# Patient Record
Sex: Male | Born: 1965 | Race: White | Hispanic: No | Marital: Married | Smoking: Never smoker
Health system: Southern US, Community
[De-identification: ages and names within clinical notes are randomized; demographics above are authoritative.]

## PROBLEM LIST (undated history)

## (undated) DIAGNOSIS — K219 Gastro-esophageal reflux disease without esophagitis: Secondary | ICD-10-CM

## (undated) HISTORY — DX: Gastro-esophageal reflux disease without esophagitis: K21.9

## (undated) HISTORY — PX: NO PAST SURGERIES: SHX2092

## (undated) HISTORY — PX: CLOSED REDUCTION ANKLE FRACTURE: SUR210

---

## 2003-08-31 ENCOUNTER — Emergency Department (HOSPITAL_COMMUNITY): Admission: EM | Admit: 2003-08-31 | Discharge: 2003-08-31 | Payer: Self-pay | Admitting: Family Medicine

## 2004-04-16 ENCOUNTER — Emergency Department (HOSPITAL_COMMUNITY): Admission: EM | Admit: 2004-04-16 | Discharge: 2004-04-16 | Payer: Self-pay | Admitting: Family Medicine

## 2005-09-12 ENCOUNTER — Emergency Department (HOSPITAL_COMMUNITY): Admission: EM | Admit: 2005-09-12 | Discharge: 2005-09-12 | Payer: Self-pay | Admitting: Family Medicine

## 2011-03-05 ENCOUNTER — Inpatient Hospital Stay (INDEPENDENT_AMBULATORY_CARE_PROVIDER_SITE_OTHER)
Admission: RE | Admit: 2011-03-05 | Discharge: 2011-03-05 | Disposition: A | Discharge status: Home / Self Care | Payer: 59 | Source: Ambulatory Visit | Attending: Emergency Medicine | Admitting: Emergency Medicine

## 2011-03-05 ENCOUNTER — Inpatient Hospital Stay (HOSPITAL_COMMUNITY)
Admission: EM | Admit: 2011-03-05 | Discharge: 2011-03-06 | DRG: 638 | Disposition: A | Discharge status: Home / Self Care | Payer: 59 | Source: Ambulatory Visit | Attending: Internal Medicine | Admitting: Internal Medicine

## 2011-03-05 ENCOUNTER — Emergency Department (HOSPITAL_COMMUNITY): Payer: 59

## 2011-03-05 DIAGNOSIS — E119 Type 2 diabetes mellitus without complications: Secondary | ICD-10-CM

## 2011-03-05 DIAGNOSIS — E871 Hypo-osmolality and hyponatremia: Secondary | ICD-10-CM | POA: Diagnosis present

## 2011-03-05 DIAGNOSIS — B379 Candidiasis, unspecified: Secondary | ICD-10-CM | POA: Diagnosis present

## 2011-03-05 DIAGNOSIS — E781 Pure hyperglyceridemia: Secondary | ICD-10-CM | POA: Diagnosis present

## 2011-03-05 DIAGNOSIS — E1101 Type 2 diabetes mellitus with hyperosmolarity with coma: Principal | ICD-10-CM | POA: Diagnosis present

## 2011-03-05 LAB — URINALYSIS, ROUTINE W REFLEX MICROSCOPIC
Hgb urine dipstick: NEGATIVE
Leukocytes, UA: NEGATIVE
Nitrite: NEGATIVE
Specific Gravity, Urine: 1.043 — ABNORMAL HIGH (ref 1.005–1.030)
Urobilinogen, UA: 0.2 mg/dL (ref 0.0–1.0)

## 2011-03-05 LAB — CBC
Hemoglobin: 15.3 g/dL (ref 13.0–17.0)
MCHC: 36.3 g/dL — ABNORMAL HIGH (ref 30.0–36.0)
Platelets: 241 10*3/uL (ref 150–400)
RBC: 5.25 MIL/uL (ref 4.22–5.81)

## 2011-03-05 LAB — POCT URINALYSIS DIP (DEVICE)
Bilirubin Urine: NEGATIVE
Glucose, UA: 500 mg/dL — AB
Leukocytes, UA: NEGATIVE
Nitrite: NEGATIVE

## 2011-03-05 LAB — POCT I-STAT, CHEM 8
Creatinine, Ser: 1 mg/dL (ref 0.50–1.35)
HCT: 50 % (ref 39.0–52.0)
Hemoglobin: 17 g/dL (ref 13.0–17.0)
Potassium: 4.5 mEq/L (ref 3.5–5.1)
Sodium: 128 mEq/L — ABNORMAL LOW (ref 135–145)

## 2011-03-05 LAB — DIFFERENTIAL
Basophils Absolute: 0.1 10*3/uL (ref 0.0–0.1)
Basophils Relative: 1 % (ref 0–1)
Neutro Abs: 8.3 10*3/uL — ABNORMAL HIGH (ref 1.7–7.7)
Neutrophils Relative %: 67 % (ref 43–77)

## 2011-03-05 LAB — COMPREHENSIVE METABOLIC PANEL
Albumin: 3.6 g/dL (ref 3.5–5.2)
Alkaline Phosphatase: 118 U/L — ABNORMAL HIGH (ref 39–117)
BUN: 18 mg/dL (ref 6–23)
Calcium: 9.5 mg/dL (ref 8.4–10.5)
Potassium: 4.1 mEq/L (ref 3.5–5.1)
Total Protein: 6.3 g/dL (ref 6.0–8.3)

## 2011-03-05 LAB — GLUCOSE, CAPILLARY
Glucose-Capillary: 407 mg/dL — ABNORMAL HIGH (ref 70–99)
Glucose-Capillary: 543 mg/dL — ABNORMAL HIGH (ref 70–99)
Glucose-Capillary: 338 mg/dL — ABNORMAL HIGH (ref 70–99)

## 2011-03-05 LAB — URINE MICROSCOPIC-ADD ON

## 2011-03-05 LAB — POCT I-STAT 3, VENOUS BLOOD GAS (G3P V)
pCO2, Ven: 46 mmHg (ref 45.0–50.0)
pH, Ven: 7.325 — ABNORMAL HIGH (ref 7.250–7.300)
pO2, Ven: 29 mmHg — CL (ref 30.0–45.0)

## 2011-03-05 NOTE — H&P (Signed)
Jim [name], Jim [name]               ACCOUNT NO.:  [id number]  MEDICAL RECORD NO.:  [id number]  LOCATION:  [facility]                         FACILITY:  [facility]  PHYSICIAN:  Conley Canal, MD      DATE OF BIRTH:  1966/05/25  DATE OF ADMISSION:  03/05/2011 DATE OF DISCHARGE:                             HISTORY & PHYSICAL   The patient has no primary care physician.  CHIEF COMPLAINT:  Excessive thirst, polyuria, polydipsia, blurred vision for the last 2-3 weeks.  HISTORY OF PRESENT ILLNESS:  Jim [name] is a 45 year old male with no significant past medical history who comes in today with complaints of excessive thirst, polyuria, polydipsia, blurred vision with no other associated symptoms.  No respiratory, gastrointestinal , or other symptomatology.  He denies any fever.  He had otherwise been healthy, but he has felt somewhat lethargic in the last few weeks.  He denies any dysuria.  Upon presentation to the emergency room, he was found to have blood sugar greater than 700 mg/dL and to be also hyponatremic with a sodium of 130, and a WBC 12.4.  ABG showed pH of 7.32.  The patient was started on IV fluids and also glucose stabilizer and was referred to the Hospitalist Service for further management.  He has not been following with primary care in the last few years as he has been otherwise healthy.  PAST MEDICAL HISTORY:  None.  SOCIAL HISTORY:  The patient is married.  He works for [employer], occasionally drinks alcohol and denies cigarette smoking or illicit drugs.  FAMILY HISTORY:  Positive for brother with premature coronary artery disease and nephew with diabetes mellitus.  ALLERGIES:  No known drug allergies.  HOME MEDICATIONS:  None.  REVIEW OF SYSTEMS:  Unremarkable except as highlighted in the history of present illness.  PHYSICAL EXAMINATION:  GENERAL:  On examination, this is a well looking middle-aged male who is not in acute distress. VITAL SIGNS:  Blood  pressure 130 systolic, heart rate 80s.  He is febrile, oxygenating adequately, respiratory rate around 18. HEAD, EARS, NOSE AND THROAT:  Pupils are equal reacting to light.  No jugular venous distention.  No carotid bruits. RESPIRATORY SYSTEM:  Good air entry bilaterally with no rhonchi, rales, or wheezes. CARDIOVASCULAR SYSTEM:  First and second heart sounds heard.  No murmurs.  Pulse regular. ABDOMEN:  Scaphoid, soft, and nontender.  No palpable organomegaly. Bowel sounds are normal. CNS:  The patient is alert and oriented in person, place, and time with no acute focal neurological deficits. EXTREMITIES:  No pedal edema.  Peripheral pulses equal.  LABORATORY FINDINGS:  Labs were reviewed significant for WBC of 12.4, hemoglobin 15.3, hematocrit 42.1, platelet count 241, eosinophils 8%, neutrophils 8.3.  Sodium 130, potassium 4.1, BUN 18, creatinine 0.83, bicarbonate 21, glucose 564, alkaline phosphatase 118, AST 34, ALT 54, calcium 9.5.  No imaging studies.  IMPRESSION:  A newly diagnosed diabetic who comes in with nonketotic hyperosmolar hyperglycemia.  He has leukocytosis which is probably secondary to demargination.  He has no evidence of infection.  The plan is: 1. Nonketotic hyperosmolar hyperglycemia, a newly diagnosed diabetic.     He seems to be responding to current  interventions in the emergency     room.  His sugars have improved to the 200 mg/dL range.  He has     received about 3 liters of IV fluids so far.  We will admit the     patient to regular medicine, and I will give him Lantus 15 units     subcu now with some aspartate insulin, discontinued the glucose     stabilizer and continued IV fluids.  Expect hyperglycemia to     continue to improve.  We will also obtain lipids panel, TSH, chest     x-ray.  Elevated liver enzymes could be related to ongoing     hyperglycemia.  We will repeat labs in the morning. 2. DVT, GI prophylaxis. 3. Anticipate early discharge.   The patient will need followup with     primary care for healthcare maintenance issues. 4. Patient's condition is fair.     Conley Canal, MD     SR/MEDQ  D:  03/05/2011  T:  03/05/2011  Job:  [id number]  Electronically Signed by Conley Canal  on 03/05/2011 10:28:50 PM

## 2011-03-06 LAB — COMPREHENSIVE METABOLIC PANEL
AST: 36 U/L (ref 0–37)
Albumin: 2.9 g/dL — ABNORMAL LOW (ref 3.5–5.2)
BUN: 19 mg/dL (ref 6–23)
Calcium: 8.2 mg/dL — ABNORMAL LOW (ref 8.4–10.5)
Creatinine, Ser: 0.84 mg/dL (ref 0.50–1.35)
GFR calc non Af Amer: >60 mL/min (ref >60)
Total Bilirubin: 0.2 mg/dL — ABNORMAL LOW (ref 0.3–1.2)

## 2011-03-06 LAB — LIPID PANEL
Cholesterol: 203 mg/dL — ABNORMAL HIGH (ref 0–200)
HDL: 24 mg/dL — ABNORMAL LOW (ref >39)
LDL Cholesterol: UNDETERMINED mg/dL (ref 0–99)
Triglycerides: 1352 mg/dL — ABNORMAL HIGH (ref <150)
VLDL: UNDETERMINED mg/dL (ref 0–40)

## 2011-03-06 LAB — CBC
HCT: 36.1 % — ABNORMAL LOW (ref 39.0–52.0)
Hemoglobin: 13.3 g/dL (ref 13.0–17.0)
MCH: 29.7 pg (ref 26.0–34.0)
MCHC: 36.8 g/dL — ABNORMAL HIGH (ref 30.0–36.0)
MCV: 80.6 fL (ref 78.0–100.0)
RBC: 4.48 MIL/uL (ref 4.22–5.81)

## 2011-03-06 LAB — GLUCOSE, CAPILLARY: Glucose-Capillary: 275 mg/dL — ABNORMAL HIGH (ref 70–99)

## 2011-03-06 LAB — PHOSPHORUS: Phosphorus: 2.8 mg/dL (ref 2.3–4.6)

## 2011-03-06 LAB — MAGNESIUM: Magnesium: 1.8 mg/dL (ref 1.5–2.5)

## 2011-03-07 LAB — URINE CULTURE
Culture  Setup Time: 201208200032
Culture: NO GROWTH

## 2011-03-12 LAB — CULTURE, BLOOD (ROUTINE X 2)
Culture: NO GROWTH
Culture: NO GROWTH

## 2011-03-14 ENCOUNTER — Ambulatory Visit (INDEPENDENT_AMBULATORY_CARE_PROVIDER_SITE_OTHER): Payer: 59 | Admitting: Internal Medicine

## 2011-03-14 ENCOUNTER — Encounter: Payer: Self-pay | Admitting: Internal Medicine

## 2011-03-14 VITALS — BP 106/70 | HR 84 | Temp 98.4°F | Resp 16 | Ht 69.0 in | Wt 157.0 lb

## 2011-03-14 DIAGNOSIS — E119 Type 2 diabetes mellitus without complications: Secondary | ICD-10-CM | POA: Insufficient documentation

## 2011-03-14 DIAGNOSIS — R51 Headache: Secondary | ICD-10-CM

## 2011-03-14 MED ORDER — INSULIN GLARGINE 100 UNIT/ML ~~LOC~~ SOLN: 18.0000 [IU] | Freq: Every day | SUBCUTANEOUS | Status: DC

## 2011-03-14 MED ORDER — METFORMIN HCL 500 MG PO TABS: 500.0000 mg | ORAL_TABLET | Freq: Two times a day (BID) | ORAL | Status: DC

## 2011-03-14 MED ORDER — GLUCOSE BLOOD VI STRP: ORAL_STRIP | Status: DC

## 2011-03-14 NOTE — Progress Notes (Signed)
Subjective:    Patient ID: Jim [name], male    DOB: 12/18/1965, 45 y.o.   MRN: [id number]  Diabetes He presents for his initial diabetic visit. He has type 2 diabetes mellitus. No MedicAlert identification noted. The initial diagnosis of diabetes was made 1 week ago. His disease course has been improving. There are no hypoglycemic associated symptoms. (No episodes of hypoglycemia) Associated symptoms include blurred vision, fatigue, polydipsia, polyphagia, polyuria, visual change and weight loss. Pertinent negatives for diabetes include no chest pain, no foot paresthesias, no foot ulcerations and no weakness. (Pt was admitted to [facility] last week with fatigue, weight loss, polyphagia, polyuria. Found to have BG>700 per his report. Was started on Lantus and novolog.) Symptoms are improving. There are no diabetic complications. Risk factors for coronary artery disease include diabetes mellitus, dyslipidemia and male sex. Current diabetic treatment includes insulin injections. He is compliant with treatment all of the time. He is currently taking insulin at bedtime. Insulin injections are given by patient. Weight trend: pt reports still about 10lb below baseline prior to diagnosis. He is following a generally healthy (was given limited carbohydrate and nutrition counseling while inpatient) diet. Meal planning includes avoidance of concentrated sweets and carbohydrate counting. He has not had a previous visit with a dietician. His home blood glucose trend is decreasing steadily. His overall blood glucose range is >200 mg/dl. An ACE inhibitor/angiotensin II receptor blocker is not being taken. He does not see a podiatrist.Eye exam is not current.   Only other complaint today is mild headache, since recent hospitalization. HA is described as diffuse, occuring daily, alleviated by Tylenol 500mg .  Blurred vision as noted above. Of note, pt stopped all caffeine intake with recent hospitalization.  Outpatient  Encounter Prescriptions as of 03/14/2011  Medication Sig Dispense Refill  . Alum & Mag Hydroxide-Simeth (MAGIC MOUTHWASH) SOLN Take by mouth 4 (four) times a week.        Marland Kitchen Bioflavonoid Products (GRAPE SEED PO) Take 1 tablet by mouth daily.        . fluconazole (DIFLUCAN) 100 MG tablet Take 100 mg by mouth daily.        . insulin aspart (NOVOLOG) 100 UNIT/ML injection Inject 3 Units into the skin 3 (three) times daily before meals.        . insulin glargine (LANTUS) 100 UNIT/ML injection Inject 18 Units into the skin at bedtime.  10 mL  11  . DISCONTD: insulin glargine (LANTUS) 100 UNIT/ML injection Inject 18 Units into the skin at bedtime.        Marland Kitchen DISCONTD: insulin glargine (LANTUS) 100 UNIT/ML injection Inject 18 Units into the skin at bedtime.  10 mL  11  . glucose blood (ACCU-CHEK AVIVA) test strip Use as instructed  100 each  12     Review of Systems  Constitutional: Positive for weight loss, activity change, appetite change, fatigue and unexpected weight change. Negative for fever and chills.  Eyes: Positive for blurred vision and visual disturbance. Negative for pain.  Respiratory: Negative for chest tightness and shortness of breath.   Cardiovascular: Negative for chest pain, palpitations and leg swelling.  Gastrointestinal: Negative for nausea, abdominal pain, diarrhea, constipation and abdominal distention.  Genitourinary: Positive for polyuria.  Skin: Negative for wound.  Neurological: Negative for weakness and numbness.  Hematological: Positive for polydipsia and polyphagia.   BP 106/70  Pulse 84  Temp(Src) 98.4 F (36.9 C) (Oral)  Resp 16  Ht 5\' 9"  (1.753 m)  Wt 157  lb (71.215 kg)  BMI 23.18 kg/m2  SpO2 98%     Objective:   Physical Exam  Constitutional: He is oriented to person, place, and time. He appears well-developed and well-nourished. No distress.  HENT:  Head: Normocephalic and atraumatic.  Right Ear: External ear normal.  Left Ear: External ear normal.    Nose: Nose normal.  Mouth/Throat: Oropharynx is clear and moist. No oropharyngeal exudate.  Eyes: Conjunctivae and EOM are normal. Pupils are equal, round, and reactive to light. Right eye exhibits no discharge. Left eye exhibits no discharge. No scleral icterus.  Neck: Normal range of motion. Neck supple. No tracheal deviation present. No thyromegaly present.  Cardiovascular: Normal rate, regular rhythm, normal heart sounds and intact distal pulses.  Exam reveals no gallop and no friction rub.   No murmur heard. Pulmonary/Chest: Effort normal and breath sounds normal. No respiratory distress. He has no wheezes. He has no rales. He exhibits no tenderness.  Abdominal: Soft. Bowel sounds are normal. He exhibits no distension and no mass. There is no tenderness. There is no rebound and no guarding.  Musculoskeletal: Normal range of motion. He exhibits no edema and no tenderness.  Lymphadenopathy:    He has no cervical adenopathy.  Neurological: He is alert and oriented to person, place, and time. No cranial nerve deficit. Coordination normal.  Skin: Skin is warm and dry. No rash noted. He is not diaphoretic. No erythema. No pallor.  Psychiatric: He has a normal mood and affect. His behavior is normal. Judgment and thought content normal.          Assessment & Plan:   Headache - Mild. Alleviated by tylenol. Exam is normal. Suspect related to withdrawal from caffeine after recent hospitalization. Will continue to monitor for now and use prn tylenol and ibuprofen. If symptoms worsen, or persist, will consider further evaluation.

## 2011-03-14 NOTE — Assessment & Plan Note (Addendum)
  Assessment:    Diabetes Mellitus type II, under fair control. Markedly improved since hospital admission last week with BG>700.     Plan:    1.  Rx changes: will start Metformin 500mg  by mouth twice daily. We discussed increasing Lantus to 20 units, with goal of fasting BG 80-120, but will hold off to monitor change in BG with addition of metformin. He is on very low dose of Lantus, and may be able to transition to oral meds. He will email with blood sugars. Will request records from hospitalization. 2.  Education: Reviewed pathophysiology of diabetes and general goals of diabetes management including:  A1C (<7), blood pressure (<130/80), and cholesterol (LDL <70). We discussed starting ACEi and statin, but will hold off for now, as adding metformin today. He may not tolerate ACEi, as SBP near 100. 3.  Compliance at present is estimated to be excellent. Encouraged continued efforts at limiting carbohydrates. Pt is planning to participate in education program at [facility]. 4. Follow up: 2 weeks

## 2011-03-14 NOTE — Patient Instructions (Addendum)
Start Metformin 500mg . Continue Lantus and Novolog. Labs later this week or next week. Return visit 2 weeks. Goal fasting sugar 80-120.  Diabetes, Frequently Asked Questions WHAT IS DIABETES? Most of the food we eat is turned into glucose (sugar). Our bodies use it for energy. The pancreas makes a hormone called insulin. It helps glucose get into the cells of our bodies. When you have diabetes, your body either does not make enough insulin or cannot use its own insulin as well as it should. This causes sugars to build up in your blood. WHAT ARE THE SYMPTOMS OF DIABETES?  Frequent urination.   Excessive thirst.   Unexplained weight loss.   Extreme hunger.   Blurred vision.   Tingling or numbness in hands or feet.   Feeling very tired much of the time.   Dry, itchy skin.   Sores that are slow to heal.   Yeast infections.   WHAT ARE THE TYPES OF DIABETES? Type 1 Diabetes   About 10% of affected people have this type.   Usually occurs before the age of 18.   Usually occurs in thin to normal weight people.  Type 2 Diabetes  About 90% of affected people have this type.   Usually occurs after the age of 65.   Usually occurs in overweight people.   More likely to have:   A family history of diabetes.   A history of diabetes during pregnancy (gestational diabetes).   High blood pressure.   High cholesterol and triglycerides.  Gestational Diabetes  Occurs in about 4% of pregnancies.   Usually goes away after the baby is born.   More likely to occur in women with:   Family history of diabetes.   Previous gestational diabetes.   Obese.   Over 25 years old.  WHAT IS PRE-DIABETES? Pre-diabetes means your blood glucose is higher than normal, but lower than the diabetes range. It also means you are at risk of getting type 2 diabetes and heart disease. If you are told you have pre-diabetes, have your blood glucose checked again in 1 to 2 years. WHAT IS THE TREATMENT  FOR DIABETES? Treatment is aimed at keeping blood glucose near normal levels at all times. Learning how to manage this yourself is important in treating diabetes. Depending on the type of diabetes you have, your treatment will include one or more of the following:  Monitoring your blood glucose.   Meal planning.   Exercise.   Oral medicine (pills) or insulin.  CAN DIABETES BE PREVENTED? With type 1 diabetes, prevention is more difficult, because the triggers that cause it are not yet known. With type 2 diabetes, prevention is more likely, with lifestyle changes:  Maintain a healthy weight.   Eat healthy.   Exercise.  IS THERE A CURE FOR DIABETES? No, there is no cure for diabetes. There is a lot of research going on that is looking for a cure, and progress is being made. Diabetes can be treated and controlled. People with diabetes can manage their diabetes and lead normal, active lives. SHOULD I BE TESTED FOR DIABETES? If you are at least 43 years old, you should be tested for diabetes. You should be tested again every 3 years. If you are 45 or older and overweight, you may want to get tested more often. If you are younger than 45, overweight, and have one or more of the following risk factors, you should be tested:  Family history of diabetes.   Inactive lifestyle.  High blood pressure.  WHAT ARE SOME OTHER SOURCES FOR INFORMATION ON DIABETES? The following organizations may help in your search for more information on diabetes: National Diabetes Education Program (NDEP) Internet: [employer] Telephone toll free: 289-696-4980 American Diabetes Association Telephone: (515)014-6425 To order publications toll free: 1-800-ADA-ORDER  Diabetes information: 1-814 872 0617 (800-DIABETES)  Internet: http://www.diabetes.org  Juvenile [employer] Telephone: (304)625-8105 Internet: [employer] Document Released: 07/06/2003 Document  Re-Released: 06/21/2009 [facility] Patient Information 2011 [place], [place].

## 2011-03-15 ENCOUNTER — Other Ambulatory Visit: Payer: Self-pay | Admitting: *Deleted

## 2011-03-15 MED ORDER — "INSULIN SYRINGE-NEEDLE U-100 30G X 3/8"" 0.3 ML MISC": Status: DC

## 2011-03-15 NOTE — Telephone Encounter (Signed)
Patient needs rx for insulin syringes to be sent to pharmacy.

## 2011-03-15 NOTE — Telephone Encounter (Signed)
Fine to fill. 

## 2011-03-17 ENCOUNTER — Other Ambulatory Visit (INDEPENDENT_AMBULATORY_CARE_PROVIDER_SITE_OTHER): Payer: 59 | Admitting: *Deleted

## 2011-03-17 ENCOUNTER — Encounter: Payer: Self-pay | Admitting: Internal Medicine

## 2011-03-17 DIAGNOSIS — E119 Type 2 diabetes mellitus without complications: Secondary | ICD-10-CM

## 2011-03-17 LAB — COMPREHENSIVE METABOLIC PANEL
ALT: 58 U/L — ABNORMAL HIGH (ref 0–53)
Albumin: 4.2 g/dL (ref 3.5–5.2)
Alkaline Phosphatase: 85 U/L (ref 39–117)
Glucose, Bld: 178 mg/dL — ABNORMAL HIGH (ref 70–99)
Potassium: 4.4 mEq/L (ref 3.5–5.1)
Sodium: 137 mEq/L (ref 135–145)
Total Protein: 6.8 g/dL (ref 6.0–8.3)

## 2011-03-17 LAB — MICROALBUMIN / CREATININE URINE RATIO: Microalb, Ur: 0.5 mg/dL (ref 0.0–1.9)

## 2011-03-17 LAB — LIPID PANEL
HDL: 40.4 mg/dL (ref >39.00)
Total CHOL/HDL Ratio: 5

## 2011-03-31 ENCOUNTER — Ambulatory Visit: Payer: 59 | Admitting: Internal Medicine

## 2011-04-12 ENCOUNTER — Ambulatory Visit (INDEPENDENT_AMBULATORY_CARE_PROVIDER_SITE_OTHER): Payer: 59 | Admitting: Internal Medicine

## 2011-04-12 ENCOUNTER — Encounter: Payer: Self-pay | Admitting: Internal Medicine

## 2011-04-12 DIAGNOSIS — E119 Type 2 diabetes mellitus without complications: Secondary | ICD-10-CM

## 2011-04-12 LAB — GLUCOSE, POCT (MANUAL RESULT ENTRY): POC Glucose: 137

## 2011-04-12 MED ORDER — GLIPIZIDE 5 MG PO TABS: 5.0000 mg | ORAL_TABLET | Freq: Two times a day (BID) | ORAL | Status: DC

## 2011-04-12 MED ORDER — "INSULIN SYRINGE-NEEDLE U-100 30G X 3/8"" 0.3 ML MISC": Status: DC

## 2011-04-12 MED ORDER — INSULIN GLARGINE 100 UNIT/ML ~~LOC~~ SOLN: 15.0000 [IU] | Freq: Every day | SUBCUTANEOUS | Status: DC

## 2011-04-12 NOTE — Patient Instructions (Signed)
Start glipizide 5mg  twice daily. Monitor blood sugar carefully. Call if blood sugar less than 80. If blood sugar is less than 80, stop Lantus.

## 2011-04-12 NOTE — Progress Notes (Signed)
Subjective:    Patient ID: Jim [name], male    DOB: 20-Aug-1965, 45 y.o.   MRN: [id number]  HPI Jim [name] is a 45 year old male with a history of diabetes who presents for followup. In the last month, he reports significant improvement in control of his blood sugar. He brings a record of his blood sugars today which show most fasting sugars between 70 and 100 and postprandial sugars between 100-150. There is one outlier high sugar of 207. He did have a low blood sugar this morning is 64 for which he was symptomatic. He continues to use Lantus 18 units at bedtime, however he has stopped using NovoLog because of low blood sugars. He reports that he has been feeling well and has made significant improvement in his diet. He denies any complaints today. He does not wish to receive a flu vaccine.  Outpatient Prescriptions Prior to Visit  Medication Sig Dispense Refill  . Bioflavonoid Products (GRAPE SEED PO) Take 1 tablet by mouth daily.        Marland Kitchen glucose blood (ACCU-CHEK AVIVA) test strip Use as instructed  100 each  12  . metFORMIN (GLUCOPHAGE) 500 MG tablet Take 1 tablet (500 mg total) by mouth 2 (two) times daily with a meal.  60 tablet  11  . fluconazole (DIFLUCAN) 100 MG tablet Take 100 mg by mouth daily.        . insulin aspart (NOVOLOG) 100 UNIT/ML injection Inject 3 Units into the skin as needed. Using as needed      . insulin glargine (LANTUS) 100 UNIT/ML injection Inject 18 Units into the skin at bedtime.  10 mL  11  . Insulin Syringe-Needle U-100 30G X 3/8" 0.3 ML MISC Inject 18 units daily at bedtime of lantus and inject 3 units 3 times daily before meals of novolog  120 each  11    Review of Systems  Constitutional: Negative for fever, chills, activity change, appetite change, fatigue and unexpected weight change.  Eyes: Negative for visual disturbance.  Respiratory: Negative for cough and shortness of breath.   Cardiovascular: Negative for chest pain, palpitations and leg  swelling.  Gastrointestinal: Negative for abdominal pain and abdominal distention.  Genitourinary: Negative for dysuria, urgency and difficulty urinating.  Musculoskeletal: Negative for arthralgias and gait problem.  Skin: Negative for color change and rash.  Hematological: Negative for adenopathy.  Psychiatric/Behavioral: Negative for sleep disturbance and dysphoric mood. The patient is not nervous/anxious.    BP 103/66  Pulse 92  Temp(Src) 98.2 F (36.8 C) (Oral)  Resp 16  Ht 5\' 9"  (1.753 m)  Wt 164 lb (74.39 kg)  BMI 24.22 kg/m2  SpO2 99%     Objective:   Physical Exam  Constitutional: He is oriented to person, place, and time. He appears well-developed and well-nourished. No distress.  HENT:  Head: Normocephalic and atraumatic.  Right Ear: External ear normal.  Left Ear: External ear normal.  Nose: Nose normal.  Mouth/Throat: Oropharynx is clear and moist. No oropharyngeal exudate.  Eyes: Conjunctivae and EOM are normal. Pupils are equal, round, and reactive to light. Right eye exhibits no discharge. Left eye exhibits no discharge. No scleral icterus.  Neck: Normal range of motion. Neck supple. No tracheal deviation present. No thyromegaly present.  Cardiovascular: Normal rate, regular rhythm and normal heart sounds.  Exam reveals no gallop and no friction rub.   No murmur heard. Pulmonary/Chest: Effort normal and breath sounds normal. No respiratory distress. He has no wheezes. He has  no rales. He exhibits no tenderness.  Musculoskeletal: Normal range of motion. He exhibits no edema.  Lymphadenopathy:    He has no cervical adenopathy.  Neurological: He is alert and oriented to person, place, and time. No cranial nerve deficit. Coordination normal.  Skin: Skin is warm and dry. No rash noted. He is not diaphoretic. No erythema. No pallor.  Psychiatric: He has a normal mood and affect. His behavior is normal. Judgment and thought content normal.          Assessment &  Plan:  1. Diabetes - the patient with significant improvement in control of his blood sugars. He has stopped his NovoLog. And is continuing to have low blood sugars on Lantus alone. We will plan to taper Lantus down to 15 units daily. We will plan to start glipizide, with the goal of getting him off injectable insulin. He will monitor his blood sugars carefully, and if he has any low blood sugars less than 80 he will stop his Lantus. He will followup in one month and have a hemoglobin A1c and lipid profile performed prior to that visit.

## 2011-05-01 NOTE — Discharge Summary (Signed)
NAMESAYEED, WEATHERALL               ACCOUNT NO.:  [id number]  MEDICAL RECORD NO.:  [id number]  LOCATION:  5530                         FACILITY:  [facility]  PHYSICIAN:  Leahna Hewson I Dan Scearce, MD      DATE OF BIRTH:  January 08, 1966  DATE OF ADMISSION:  03/05/2011 DATE OF DISCHARGE:  03/06/2011                              DISCHARGE SUMMARY   PRIMARY CARE PHYSICIAN:  Dr. Dan Humphreys with  at [facility].  DISCHARGE DIAGNOSES: 1. Hyperosmolar nonketotic hyperglycemia. 2. Newly diagnosed diabetic. 3. Hypertriglyceridemia. 4. Candidiasis.  CONDITION AT THE TIME OF DISCHARGE:  The patient is alert and oriented, feeling well.  His symptoms have improved.  He is ready for discharge to home.  HISTORY AND BRIEF HOSPITAL COURSE:  Mr. Dawood is a 45 year old Caucasian male with no significant past medical history who came into the emergency department on the 19th complaining of excessive thirst, polyuria, polydipsia, and blurred vision.  Otherwise he has been healthy but had been feeling somewhat lethargic over the past few weeks.  In the emergency room, he was found to have a CBG of greater than 700.  He was also hyponatremic and had an elevated WBC of 12.4.  Mr. Trulson was placed on the glucose stabilizer and [facility] was called for admission.  He was placed on IV fluids and the insulin.  Quickly his blood sugars came down to the 200s and he was taken off the glucose stabilizer and started on a diabetic diet.  Mr. Stigger received a tremendous amount of education during his short stay here including nutritional education, diabetic education, insulin injection education. At the time of discharge, he feels comfortable giving himself insulin injections and checking his CBGs.  PHYSICAL EXAMINATION:  GENERAL:  The patient is alert and oriented. Demeanor is pleasant, cooperative.  He is in no apparent distress. VITAL SIGNS:  Temperature 98.4, pulse 79, respirations 18, blood pressure  116/75, O2 sats 100%. HEAD:  Atraumatic normocephalic.  Eyes are anicteric.  Pupils are equal, round, reactive to light.  Nose shows no nasal discharge or exterior lesions.  Mouth has moist mucous membranes with good dentition. NECK:  Supple with midline trachea.  No JVD.  No lymphadenopathy. CHEST:  Demonstrates no accessory muscle use.  He has no wheezes or crackles to my exam. HEART:  Has a regular rate and rhythm without obvious murmurs, rubs, or gallops. ABDOMEN:  Soft, nontender, nondistended with good bowel sounds. EXTREMITIES:  Show no clubbing, cyanosis, or edema. SKIN:  Shows no rashes, bruises, or lesions. NEURO:  Cranial nerves II-XII are grossly intact.  He has no facial asymmetries, no obvious focal neuro deficits.  PERTINENT LABS THIS HOSPITALIZATION:  At the time of discharge, the patient has a white count of 12.5, hemoglobin 13.3, hematocrit 36.1, platelets 270,000.  Sodium 137, potassium 3.8, chloride 104, bicarb 18, glucose 202, BUN 19, creatinine 0.84, total bilirubin 0.2, alkaline phosphatase 88, AST 36, ALT 43, total protein 5.2, serum albumin 2.9, serum calcium 8.2, hemoglobin A1c is 11.5.  Cholesterol panel shows total cholesterol 203, triglycerides 1352, HDL 24, LDL unable to calculate because of his high triglycerides, similarly VLDL.  His TSH is 1.164.  Urine demonstrated greater  than 1000 glucose, 48 ketones, 0-2 WBCs, negative leukocytes, negative nitrites.  Urine cultures and blood cultures were obtained but at this point in time are still pending.  RADIOLOGICAL EXAM:  The patient had a chest x-ray on August 19 that showed no active cardiopulmonary process.  DISCHARGE MEDICATIONS: 1. He will receive a prescription for Accu-Chek strips. 2. He will receive a prescription for Accu-Chek meter. 3. Lantus insulin 15 units subcutaneously daily at bedtime. 4. NovoLog injection sliding scale 18 units subcutaneously daily to     take at bedtime. 5. Fluconazole  100 mg by mouth daily, take for 7 days. 6. Magic mouth wash 5 mL by mouth 4 times daily. 7. TriCor 48/145, 1 pill by mouth daily, take for 30 days. 8. The patient takes an over-the-counter muscadine grape seed extract     which he can continue to take once daily.  DISCHARGE INSTRUCTIONS:  The patient will be discharged to home after he is comfortable and able to give himself CBG checks and injections of subcutaneous insulin.  He is to follow a diabetic diet.  He has no activity restrictions.  FOLLOWUP APPOINTMENTS:  He is to see Dr. Dan Humphreys with [facility] on August 28 at 11:15 a.m.  He is also to receive a referral for outpatient diabetes education.  SUGGESTIONS FOR OUTPATIENT FOLLOWUP: 1. Institute diabetes management. 2. Please follow up on high triglycerides.     Stephani Police, PA   ______________________________ Suzzanne Cloud, MD    MLY/MEDQ  D:  03/06/2011  T:  03/07/2011  Job:  [id number]  cc:   Dr. Dan Humphreys  Electronically Signed by Algis Downs PA on 03/10/2011 08:32:29 AM Electronically Signed by Ebony Cargo MD on 05/01/2011 04:28:19 PM

## 2011-05-11 ENCOUNTER — Other Ambulatory Visit: Payer: 59

## 2011-05-16 ENCOUNTER — Other Ambulatory Visit (INDEPENDENT_AMBULATORY_CARE_PROVIDER_SITE_OTHER): Payer: 59 | Admitting: *Deleted

## 2011-05-16 DIAGNOSIS — E119 Type 2 diabetes mellitus without complications: Secondary | ICD-10-CM

## 2011-05-16 LAB — COMPREHENSIVE METABOLIC PANEL
Alkaline Phosphatase: 66 U/L (ref 39–117)
BUN: 21 mg/dL (ref 6–23)
Creatinine, Ser: 0.9 mg/dL (ref 0.4–1.5)
GFR: 104.35 mL/min (ref >60.00)
Glucose, Bld: 116 mg/dL — ABNORMAL HIGH (ref 70–99)
Total Bilirubin: 0.4 mg/dL (ref 0.3–1.2)

## 2011-05-16 LAB — HEMOGLOBIN A1C: Hgb A1c MFr Bld: 7.3 % — ABNORMAL HIGH (ref 4.6–6.5)

## 2011-05-16 LAB — LIPID PANEL
Cholesterol: 168 mg/dL (ref 0–200)
HDL: 43.8 mg/dL (ref >39.00)
Triglycerides: 147 mg/dL (ref 0.0–149.0)
VLDL: 29.4 mg/dL (ref 0.0–40.0)

## 2011-05-17 ENCOUNTER — Ambulatory Visit (INDEPENDENT_AMBULATORY_CARE_PROVIDER_SITE_OTHER): Payer: 59 | Admitting: Internal Medicine

## 2011-05-17 ENCOUNTER — Encounter: Payer: Self-pay | Admitting: Internal Medicine

## 2011-05-17 VITALS — BP 108/69 | HR 86 | Temp 99.1°F | Resp 16 | Ht 69.0 in | Wt 162.0 lb

## 2011-05-17 DIAGNOSIS — E785 Hyperlipidemia, unspecified: Secondary | ICD-10-CM

## 2011-05-17 DIAGNOSIS — E119 Type 2 diabetes mellitus without complications: Secondary | ICD-10-CM

## 2011-05-17 MED ORDER — GLUCOSE BLOOD VI STRP: ORAL_STRIP | Status: DC

## 2011-05-18 NOTE — Progress Notes (Signed)
Subjective:    Patient ID: Jim [name], male    DOB: Dec 26, 1965, 45 y.o.   MRN: [id number]  HPI 45 year old male with history of diabetes presents for followup. He notes that over the last 3 months he has tapered off insulin and has been taking glipizide and metformin. He reports that he had some low blood sugars even using glipizide and metformin alone and so has tapered to using glipizide only intermittently. He continues to take metformin twice daily. He brings a record of his blood sugars which showed low sugars between 60 and 100 fasting and most sugars after meals between 120 and 180. He reports that he has been feeling well. He denies any new concerns today.  Outpatient Encounter Prescriptions as of 05/17/2011  Medication Sig Dispense Refill  . Bioflavonoid Products (GRAPE SEED PO) Take 1 tablet by mouth daily.        Marland Kitchen glipiZIDE (GLUCOTROL) 5 MG tablet Take 1 tablet (5 mg total) by mouth 2 (two) times daily.  60 tablet  11  . glucose blood (ACCU-CHEK AVIVA) test strip Use as instructed  100 each  12  . metFORMIN (GLUCOPHAGE) 500 MG tablet Take 1 tablet (500 mg total) by mouth 2 (two) times daily with a meal.  60 tablet  11    Review of Systems  Constitutional: Negative for fever, chills, activity change, appetite change, fatigue and unexpected weight change.  Eyes: Negative for visual disturbance.  Respiratory: Negative for cough and shortness of breath.   Cardiovascular: Negative for chest pain, palpitations and leg swelling.  Gastrointestinal: Negative for abdominal pain and abdominal distention.  Genitourinary: Negative for dysuria, urgency and difficulty urinating.  Musculoskeletal: Negative for arthralgias and gait problem.  Skin: Negative for color change and rash.  Hematological: Negative for adenopathy.  Psychiatric/Behavioral: Negative for sleep disturbance and dysphoric mood. The patient is not nervous/anxious.    BP 108/69  Pulse 86  Temp(Src) 99.1 F (37.3 C)  (Oral)  Resp 16  Ht 5\' 9"  (1.753 m)  Wt 162 lb (73.483 kg)  BMI 23.92 kg/m2  SpO2 97%     Objective:   Physical Exam  Constitutional: He is oriented to person, place, and time. He appears well-developed and well-nourished. No distress.  HENT:  Head: Normocephalic and atraumatic.  Right Ear: External ear normal.  Left Ear: External ear normal.  Nose: Nose normal.  Mouth/Throat: Oropharynx is clear and moist. No oropharyngeal exudate.  Eyes: Conjunctivae and EOM are normal. Pupils are equal, round, and reactive to light. Right eye exhibits no discharge. Left eye exhibits no discharge. No scleral icterus.  Neck: Normal range of motion. Neck supple. No tracheal deviation present. No thyromegaly present.  Cardiovascular: Normal rate, regular rhythm and normal heart sounds.  Exam reveals no gallop and no friction rub.   No murmur heard. Pulmonary/Chest: Effort normal and breath sounds normal. No respiratory distress. He has no wheezes. He has no rales. He exhibits no tenderness.  Musculoskeletal: Normal range of motion. He exhibits no edema.  Lymphadenopathy:    He has no cervical adenopathy.  Neurological: He is alert and oriented to person, place, and time. No cranial nerve deficit. Coordination normal.  Skin: Skin is warm and dry. No rash noted. He is not diaphoretic. No erythema. No pallor.  Psychiatric: He has a normal mood and affect. His behavior is normal. Judgment and thought content normal.          Assessment & Plan:  1. Diabetes mellitus -hemooglobin A1c markedly  improved from 11% to 7.5%. Based on his recent blood sugars, hemoglobin A1c is probably even lower at this point. We will continue to use metformin and glipizide. He will monitor sugars closely if he is having low sugars he will stop his glipizide. He will followup in 3 months with a repeat hemoglobin A1c. He is not on an ACE inhibitor because of hypotension. He is not on a statin per pt preference.  2.  Hyperlipidemia -recent lipids showed LDL near goal at 93. Patient would prefer not to start statin medication at this time we will continue to monitor his cholesterol closely and plan to repeat labs in 3 months. Encourage continued healthy diet and increase physical activity. Her  3. Health maintenance - patient refused flu vaccine and pneumonia vaccine today.

## 2011-08-15 ENCOUNTER — Other Ambulatory Visit (INDEPENDENT_AMBULATORY_CARE_PROVIDER_SITE_OTHER): Payer: 59 | Admitting: *Deleted

## 2011-08-15 DIAGNOSIS — E119 Type 2 diabetes mellitus without complications: Secondary | ICD-10-CM

## 2011-08-15 LAB — COMPREHENSIVE METABOLIC PANEL
ALT: 37 U/L (ref 0–53)
AST: 20 U/L (ref 0–37)
Albumin: 4.3 g/dL (ref 3.5–5.2)
Alkaline Phosphatase: 72 U/L (ref 39–117)
Calcium: 9.2 mg/dL (ref 8.4–10.5)
Chloride: 105 mEq/L (ref 96–112)
Potassium: 4.3 mEq/L (ref 3.5–5.1)

## 2011-08-15 LAB — HEMOGLOBIN A1C: Hgb A1c MFr Bld: 6.2 % (ref 4.6–6.5)

## 2011-08-15 LAB — LIPID PANEL
HDL: 37.2 mg/dL — ABNORMAL LOW (ref >39.00)
Total CHOL/HDL Ratio: 5

## 2011-08-17 ENCOUNTER — Encounter: Payer: Self-pay | Admitting: Internal Medicine

## 2011-08-17 ENCOUNTER — Ambulatory Visit (INDEPENDENT_AMBULATORY_CARE_PROVIDER_SITE_OTHER): Payer: 59 | Admitting: Internal Medicine

## 2011-08-17 VITALS — BP 122/80 | HR 106 | Temp 98.1°F | Ht 68.0 in | Wt 164.0 lb

## 2011-08-17 DIAGNOSIS — E119 Type 2 diabetes mellitus without complications: Secondary | ICD-10-CM

## 2011-08-17 MED ORDER — METFORMIN HCL 500 MG PO TABS: 500.0000 mg | ORAL_TABLET | Freq: Two times a day (BID) | ORAL | Status: DC

## 2011-08-17 NOTE — Assessment & Plan Note (Signed)
  Assessment:    Diabetes Mellitus type II, under excellent control.    Plan:    1.  Rx changes: will continue Metformin 500mg  by mouth twice daily.  . 2.  Education: Reviewed pathophysiology of diabetes and general goals of diabetes management including:  A1C (<7), blood pressure (<130/80), and cholesterol (LDL <70). Unable to add ACEi because of hypotension. Pt would prefer not to add statin, LDL 90. 3.  Compliance at present is estimated to be excellent. Encouraged continued efforts at limiting carbohydrates.

## 2011-08-17 NOTE — Progress Notes (Signed)
Subjective:    Patient ID: Jim [name], male    DOB: 1966-02-29, 46 y.o.   MRN: [id number]  HPI 46 year old male with history of diabetes mellitus presents for followup. He reports that he has generally been doing well. He notes that blood sugars have been well-controlled and fasting sugars are generally below 150. Recent hemoglobin A1c  was 6.2%. He denies any blood sugars elevated above 200 or less than 80. He reports full compliance with his metformin. He is making efforts at limiting intake of processed carbohydrates. He is also trying to increase physical activity. He denies any new concerns today.  Outpatient Encounter Prescriptions as of 08/17/2011  Medication Sig Dispense Refill  . Bioflavonoid Products (GRAPE SEED PO) Take 1 tablet by mouth daily.        Marland Kitchen glipiZIDE (GLUCOTROL) 5 MG tablet Take 5 mg by mouth daily as needed.      Marland Kitchen glucose blood (ACCU-CHEK AVIVA) test strip Use as instructed  100 each  12  . metFORMIN (GLUCOPHAGE) 500 MG tablet Take 1 tablet (500 mg total) by mouth 2 (two) times daily with a meal.  180 tablet  3    Review of Systems  Constitutional: Negative for fever, chills, diaphoresis, activity change, appetite change, fatigue and unexpected weight change.  HENT: Negative for hearing loss, ear pain, nosebleeds, congestion, sore throat, facial swelling, rhinorrhea, sneezing, drooling, mouth sores, trouble swallowing, neck pain, neck stiffness, dental problem, voice change, postnasal drip, sinus pressure, tinnitus and ear discharge.   Eyes: Negative for photophobia, pain, discharge, redness, itching and visual disturbance.  Respiratory: Negative for apnea, cough, choking, chest tightness, shortness of breath, wheezing and stridor.   Cardiovascular: Negative for chest pain, palpitations and leg swelling.  Gastrointestinal: Negative for nausea, vomiting, abdominal pain, diarrhea, constipation, blood in stool, abdominal distention, anal bleeding and rectal pain.    Genitourinary: Negative for dysuria, urgency, frequency, hematuria, flank pain, decreased urine volume, scrotal swelling, difficulty urinating and testicular pain.  Musculoskeletal: Negative for myalgias, back pain, joint swelling, arthralgias and gait problem.  Skin: Negative for color change, rash and wound.  Neurological: Negative for dizziness, tremors, seizures, syncope, speech difficulty, weakness, light-headedness, numbness and headaches.  Psychiatric/Behavioral: Negative for suicidal ideas, hallucinations, behavioral problems, confusion, sleep disturbance, dysphoric mood, decreased concentration and agitation. The patient is not nervous/anxious.    BP 122/80  Pulse 106  Temp(Src) 98.1 F (36.7 C) (Oral)  Ht 5\' 8"  (1.727 m)  Wt 164 lb (74.39 kg)  BMI 24.94 kg/m2  SpO2 97%     Objective:   Physical Exam  Constitutional: He is oriented to person, place, and time. He appears well-developed and well-nourished. No distress.  HENT:  Head: Normocephalic and atraumatic.  Right Ear: External ear normal.  Left Ear: External ear normal.  Nose: Nose normal.  Mouth/Throat: Oropharynx is clear and moist. No oropharyngeal exudate.  Eyes: Conjunctivae and EOM are normal. Pupils are equal, round, and reactive to light. Right eye exhibits no discharge. Left eye exhibits no discharge. No scleral icterus.  Neck: Normal range of motion. Neck supple. No tracheal deviation present. No thyromegaly present.  Cardiovascular: Normal rate, regular rhythm and normal heart sounds.  Exam reveals no gallop and no friction rub.   No murmur heard. Pulmonary/Chest: Effort normal and breath sounds normal. No respiratory distress. He has no wheezes. He has no rales. He exhibits no tenderness.  Musculoskeletal: Normal range of motion. He exhibits no edema.  Lymphadenopathy:    He has no cervical  adenopathy.  Neurological: He is alert and oriented to person, place, and time. No cranial nerve deficit. Coordination  normal.  Skin: Skin is warm and dry. No rash noted. He is not diaphoretic. No erythema. No pallor.     Psychiatric: He has a normal mood and affect. His behavior is normal. Judgment and thought content normal.          Assessment & Plan:

## 2011-11-10 ENCOUNTER — Telehealth: Payer: Self-pay | Admitting: *Deleted

## 2011-11-10 ENCOUNTER — Other Ambulatory Visit (INDEPENDENT_AMBULATORY_CARE_PROVIDER_SITE_OTHER): Payer: 59 | Admitting: *Deleted

## 2011-11-10 DIAGNOSIS — E785 Hyperlipidemia, unspecified: Secondary | ICD-10-CM

## 2011-11-10 DIAGNOSIS — E119 Type 2 diabetes mellitus without complications: Secondary | ICD-10-CM

## 2011-11-10 LAB — COMPREHENSIVE METABOLIC PANEL
AST: 26 U/L (ref 0–37)
Albumin: 4.3 g/dL (ref 3.5–5.2)
BUN: 22 mg/dL (ref 6–23)
Calcium: 9.4 mg/dL (ref 8.4–10.5)
Chloride: 102 mEq/L (ref 96–112)
Creatinine, Ser: 0.8 mg/dL (ref 0.4–1.5)
Glucose, Bld: 119 mg/dL — ABNORMAL HIGH (ref 70–99)
Potassium: 4.3 mEq/L (ref 3.5–5.1)

## 2011-11-10 LAB — LIPID PANEL
Cholesterol: 200 mg/dL (ref 0–200)
HDL: 38.2 mg/dL — ABNORMAL LOW (ref >39.00)
Triglycerides: 375 mg/dL — ABNORMAL HIGH (ref 0.0–149.0)

## 2011-11-10 NOTE — Telephone Encounter (Signed)
Patient came in for labs this morning. He has an appt with you next week. What labs would you like for me to order?

## 2011-11-10 NOTE — Telephone Encounter (Signed)
CMP, lipids, A1c

## 2011-11-16 ENCOUNTER — Ambulatory Visit (INDEPENDENT_AMBULATORY_CARE_PROVIDER_SITE_OTHER): Payer: 59 | Admitting: Internal Medicine

## 2011-11-16 ENCOUNTER — Encounter: Payer: Self-pay | Admitting: Internal Medicine

## 2011-11-16 VITALS — BP 128/72 | Wt 158.0 lb

## 2011-11-16 DIAGNOSIS — E785 Hyperlipidemia, unspecified: Secondary | ICD-10-CM

## 2011-11-16 DIAGNOSIS — E119 Type 2 diabetes mellitus without complications: Secondary | ICD-10-CM

## 2011-11-16 NOTE — Assessment & Plan Note (Addendum)
Blood sugars very well controlled. Recent hemoglobin A1c was 6.3%. Encouraged him to continue with efforts at diet and exercise. Note that he is not interested in starting statin medication at this time. He is also not taking ACE inhibitor because of hypotension. We'll plan to followup with repeat labs in 3 months. We'll continue metformin. Patient will schedule eye exam with his ophthalmologist. Patient declines Pneumovax.

## 2011-11-16 NOTE — Assessment & Plan Note (Signed)
We discussed a goal of LDL less than 70. Current LDL is 100. We discussed recommendation of using statin medication. Patient would prefer to try dietary means to lower cholesterol. We also discussed elevated triglycerides and limiting intake of processed carbohydrates to help with a triglyceride level. Will plan to followup in 3 months with repeat lipid profile.

## 2011-11-16 NOTE — Progress Notes (Signed)
  Subjective:    Patient ID: Jim [name], male    DOB: 08-Sep-1965, 46 y.o.   MRN: [id number]  HPI 46YO male with h/o DM presents for follow up. He reports he is doing well. We reviewed glucometer readings today which show blood sugar typically between 90 and 120. There are a few outlier's with one low sugar at 53 and 1 sugar greater than 200. We reviewed hemoglobin A1c on recent labs which was 6.3%. Patient continues to make efforts at healthy diet and regular physical activity. He reports that his overall energy level has been improving.  We also reviewed recent lipid profile which shows LDL elevated above goal at 100. We discussed goal of less than 70. We also reviewed elevated triglyceride level and discussed dietary interventions to help lower triglycerides. Patient has been reluctant to start statin medication because of potential side effects.  Outpatient Encounter Prescriptions as of 11/16/2011  Medication Sig Dispense Refill  . Bioflavonoid Products (GRAPE SEED PO) Take 1 tablet by mouth daily.        Marland Kitchen glipiZIDE (GLUCOTROL) 5 MG tablet Take 5 mg by mouth daily as needed.      Marland Kitchen glucose blood (ACCU-CHEK AVIVA) test strip Use as instructed  100 each  12  . metFORMIN (GLUCOPHAGE) 500 MG tablet Take 1 tablet (500 mg total) by mouth 2 (two) times daily with a meal.  180 tablet  3    Review of Systems  Constitutional: Negative for fever, chills, activity change, appetite change, fatigue and unexpected weight change.  Eyes: Negative for visual disturbance.  Respiratory: Negative for cough and shortness of breath.   Cardiovascular: Negative for chest pain, palpitations and leg swelling.  Gastrointestinal: Negative for abdominal pain and abdominal distention.  Genitourinary: Negative for dysuria, urgency and difficulty urinating.  Musculoskeletal: Negative for arthralgias and gait problem.  Skin: Negative for color change and rash.  Hematological: Negative for adenopathy.    Psychiatric/Behavioral: Negative for sleep disturbance and dysphoric mood. The patient is not nervous/anxious.    BP 128/72  Wt 158 lb (71.668 kg)     Objective:   Physical Exam  Constitutional: He is oriented to person, place, and time. He appears well-developed and well-nourished. No distress.  HENT:  Head: Normocephalic and atraumatic.  Right Ear: External ear normal.  Left Ear: External ear normal.  Nose: Nose normal.  Mouth/Throat: Oropharynx is clear and moist. No oropharyngeal exudate.  Eyes: Conjunctivae and EOM are normal. Pupils are equal, round, and reactive to light. Right eye exhibits no discharge. Left eye exhibits no discharge. No scleral icterus.  Neck: Normal range of motion. Neck supple. No tracheal deviation present. No thyromegaly present.  Cardiovascular: Normal rate, regular rhythm and normal heart sounds.  Exam reveals no gallop and no friction rub.   No murmur heard. Pulmonary/Chest: Effort normal and breath sounds normal. No respiratory distress. He has no wheezes. He has no rales. He exhibits no tenderness.  Musculoskeletal: Normal range of motion. He exhibits no edema.  Lymphadenopathy:    He has no cervical adenopathy.  Neurological: He is alert and oriented to person, place, and time. No cranial nerve deficit. Coordination normal.  Skin: Skin is warm and dry. No rash noted. He is not diaphoretic. No erythema. No pallor.  Psychiatric: He has a normal mood and affect. His behavior is normal. Judgment and thought content normal.          Assessment & Plan:

## 2012-01-07 ENCOUNTER — Emergency Department (HOSPITAL_COMMUNITY)
Admission: EM | Admit: 2012-01-07 | Discharge: 2012-01-07 | Disposition: A | Discharge status: Home / Self Care | Payer: 59 | Source: Home / Self Care

## 2012-01-07 ENCOUNTER — Encounter (HOSPITAL_COMMUNITY): Payer: Self-pay

## 2012-01-07 DIAGNOSIS — N390 Urinary tract infection, site not specified: Secondary | ICD-10-CM

## 2012-01-07 LAB — POCT URINALYSIS DIP (DEVICE)
Glucose, UA: NEGATIVE mg/dL
Nitrite: POSITIVE — AB
pH: 6 (ref 5.0–8.0)

## 2012-01-07 LAB — GLUCOSE, CAPILLARY: Glucose-Capillary: 131 mg/dL — ABNORMAL HIGH (ref 70–99)

## 2012-01-07 MED ORDER — CIPROFLOXACIN HCL 250 MG PO TABS: 250.0000 mg | ORAL_TABLET | Freq: Two times a day (BID) | ORAL | Status: DC

## 2012-01-07 NOTE — ED Provider Notes (Signed)
History     CSN: [id number]  Arrival date & time 01/07/12  1819   First MD Initiated Contact with Patient 01/07/12 1828      Chief Complaint  Patient presents with  . Urinary Tract Infection    (Consider location/radiation/quality/duration/timing/severity/associated sxs/prior treatment) HPI 46 year old male with history of diabetes and GERD presents with dysuria and urinary frequency.  Patient noted that his symptoms started yesterday with fever of 100.8 at home.  Has had increasing dysuria since yesterday and has had the urge to urinate frequently.  He presented to urgent care for further evaluation.  Has had urinary tract infection few years ago.  Reports that he has been in a monogamous relationship with his wife and has been sexually active.  Past Medical History  Diagnosis Date  . Diabetes mellitus 2012  . GERD (gastroesophageal reflux disease)     Past Surgical History  Procedure Date  . Closed reduction ankle fracture     Family History  Problem Relation Age of Onset  . Diabetes Brother   . Parkinsonism Maternal Grandfather   . Stroke Paternal Grandfather     History  Substance Use Topics  . Smoking status: Never Smoker   . Smokeless tobacco: Never Used  . Alcohol Use: Yes     ocassionally      Review of Systems  Constitutional: Positive for fever.  HENT: Negative.   Eyes: Negative.   Respiratory: Negative.   Cardiovascular: Negative.   Gastrointestinal: Negative.   Genitourinary: Positive for dysuria, urgency and frequency.  Musculoskeletal: Negative.   Skin: Negative.   Neurological: Negative.   Hematological: Negative.   Psychiatric/Behavioral: Negative.     Allergies  Review of patient's allergies indicates no known allergies.  Home Medications   Current Outpatient Rx  Name Route Sig Dispense Refill  . ACETAMINOPHEN 500 MG PO TABS Oral Take 500 mg by mouth every 6 (six) hours as needed.    Marland Kitchen GRAPE SEED PO Oral Take 1 tablet by mouth  daily.      Marland Kitchen CIPROFLOXACIN HCL 250 MG PO TABS Oral Take 1 tablet (250 mg total) by mouth 2 (two) times daily. 6 tablet 0  . GLIPIZIDE 5 MG PO TABS Oral Take 5 mg by mouth daily as needed.    Marland Kitchen GLUCOSE BLOOD VI STRP  Use as instructed 100 each 12  . METFORMIN HCL 500 MG PO TABS Oral Take 1 tablet (500 mg total) by mouth 2 (two) times daily with a meal. 180 tablet 3    Start by taking 1 tab at bedtime for 1 week, then  ...    BP 123/77  Pulse 113  Temp 99.8 F (37.7 C) (Oral)  Resp 20  SpO2 97%  Physical Exam  Constitutional: He is oriented to person, place, and time. He appears well-developed and well-nourished.  HENT:  Head: Normocephalic and atraumatic.  Eyes: Conjunctivae are normal. Pupils are equal, round, and reactive to light.  Neck: Normal range of motion. Neck supple.  Cardiovascular: Normal rate and regular rhythm.   Pulmonary/Chest: Effort normal and breath sounds normal.  Abdominal: Soft. Bowel sounds are normal.  Musculoskeletal: Normal range of motion.  Neurological: He is alert and oriented to person, place, and time.  Skin: Skin is warm.  Psychiatric: He has a normal mood and affect.    ED Course  Procedures (including critical care time)  Labs Reviewed  POCT URINALYSIS DIP (DEVICE) - Abnormal; Notable for the following:    Bilirubin Urine SMALL (*)  Hgb urine dipstick MODERATE (*)     Protein, ur 30 (*)     Urobilinogen, UA 2.0 (*)     Nitrite POSITIVE (*)     Leukocytes, UA LARGE (*)  Biochemical Testing Only. Please order routine urinalysis from main lab if confirmatory testing is needed.   All other components within normal limits  GLUCOSE, CAPILLARY - Abnormal; Notable for the following:    Glucose-Capillary 131 (*)     All other components within normal limits   No results found.   1. UTI (urinary tract infection)     MDM  Urinalysis positive for leukocytes and nitrates.  Start the patient on ciprofloxacin 250 mg twice a day for 3 days.   Offered the patient STD testing, patient declined.        Cristal Ford, MD 01/07/12 2101

## 2012-01-07 NOTE — ED Notes (Signed)
Pt has burning when urinating and low grade fever since yesterday.

## 2012-01-08 ENCOUNTER — Other Ambulatory Visit: Payer: Self-pay | Admitting: *Deleted

## 2012-01-08 MED ORDER — CIPROFLOXACIN HCL 250 MG PO TABS: 250.0000 mg | ORAL_TABLET | Freq: Two times a day (BID) | ORAL | Status: DC

## 2012-01-09 ENCOUNTER — Telehealth: Payer: Self-pay | Admitting: Internal Medicine

## 2012-01-09 DIAGNOSIS — N390 Urinary tract infection, site not specified: Secondary | ICD-10-CM

## 2012-01-09 MED ORDER — SULFAMETHOXAZOLE-TRIMETHOPRIM 800-160 MG PO TABS: 1.0000 | ORAL_TABLET | Freq: Two times a day (BID) | ORAL | Status: AC

## 2012-01-09 NOTE — Telephone Encounter (Signed)
Allergy to cipro. Change to septra

## 2012-02-08 ENCOUNTER — Other Ambulatory Visit (INDEPENDENT_AMBULATORY_CARE_PROVIDER_SITE_OTHER): Payer: 59 | Admitting: *Deleted

## 2012-02-08 DIAGNOSIS — E785 Hyperlipidemia, unspecified: Secondary | ICD-10-CM

## 2012-02-08 LAB — LIPID PANEL
Total CHOL/HDL Ratio: 4
Triglycerides: 103 mg/dL (ref 0.0–149.0)

## 2012-02-09 ENCOUNTER — Other Ambulatory Visit: Payer: 59

## 2012-02-16 ENCOUNTER — Ambulatory Visit (INDEPENDENT_AMBULATORY_CARE_PROVIDER_SITE_OTHER): Payer: 59 | Admitting: Internal Medicine

## 2012-02-16 ENCOUNTER — Encounter: Payer: Self-pay | Admitting: Internal Medicine

## 2012-02-16 VITALS — BP 120/84 | HR 66 | Temp 98.5°F | Ht 68.0 in | Wt 158.8 lb

## 2012-02-16 DIAGNOSIS — E1165 Type 2 diabetes mellitus with hyperglycemia: Secondary | ICD-10-CM | POA: Insufficient documentation

## 2012-02-16 DIAGNOSIS — E785 Hyperlipidemia, unspecified: Secondary | ICD-10-CM

## 2012-02-16 DIAGNOSIS — E119 Type 2 diabetes mellitus without complications: Secondary | ICD-10-CM

## 2012-02-16 NOTE — Assessment & Plan Note (Signed)
Recent lab work shows significant improvement in triglycerides, however LDL is elevated at 125. Discussed goal of less than 70. Patient would prefer not to start statin medication. He will continue with efforts at healthy diet low in saturated fat and high in fiber. Plan to repeat lipids with labs in 3 months.

## 2012-02-16 NOTE — Assessment & Plan Note (Signed)
Patient reports good control of his blood sugars. Last A1c was 6.3%. We'll plan to recheck with labs in 3 months. Continue metformin. Patient only uses glipizide on rare occasion. Unable to tolerate ACE inhibitor because of hypotension. Patient prefers not to be on statin medication. Encouraged patient to set up eye exam. Followup 3 months.

## 2012-02-16 NOTE — Progress Notes (Signed)
Subjective:    Patient ID: Jim [name], male    DOB: Jul 15, 1966, 46 y.o.   MRN: [id number]  HPI 46 year old male with history of diabetes and hyperlipidemia presents for followup. He reports he has been feeling well. He notes he has eliminated dairy products and saturated fat in his diet as much is possible. Recent lipids show marked improvement in his triglycerides. However, LDL is still above goal at 125. He notes that he prefers not to use statin medications. In regards to his blood sugars, he reports blood sugars have been well-controlled typically near 110 fasting. He has been taking metformin only. He has not used glipizide in the last 3 months. He has not yet set up eye exam.  Outpatient Encounter Prescriptions as of 02/16/2012  Medication Sig Dispense Refill  . acetaminophen (TYLENOL) 500 MG tablet Take 500 mg by mouth every 6 (six) hours as needed.      Marland Kitchen Bioflavonoid Products (GRAPE SEED PO) Take 1 tablet by mouth daily.        Marland Kitchen glipiZIDE (GLUCOTROL) 5 MG tablet Take 5 mg by mouth daily as needed.      Marland Kitchen glucose blood (ACCU-CHEK AVIVA) test strip Use as instructed  100 each  12  . metFORMIN (GLUCOPHAGE) 500 MG tablet Take 1 tablet (500 mg total) by mouth 2 (two) times daily with a meal.  180 tablet  3    Review of Systems  Constitutional: Negative for fever, chills, activity change, appetite change, fatigue and unexpected weight change.  Eyes: Negative for visual disturbance.  Respiratory: Negative for cough and shortness of breath.   Cardiovascular: Negative for chest pain, palpitations and leg swelling.  Gastrointestinal: Negative for abdominal pain and abdominal distention.  Genitourinary: Negative for dysuria, urgency and difficulty urinating.  Musculoskeletal: Negative for arthralgias and gait problem.  Skin: Negative for color change and rash.  Hematological: Negative for adenopathy.  Psychiatric/Behavioral: Negative for disturbed wake/sleep cycle and dysphoric mood. The  patient is not nervous/anxious.    BP 120/84  Pulse 66  Temp 98.5 F (36.9 C) (Oral)  Ht 5\' 8"  (1.727 m)  Wt 158 lb 12 oz (72.009 kg)  BMI 24.14 kg/m2  SpO2 96%     Objective:   Physical Exam  Constitutional: He is oriented to person, place, and time. He appears well-developed and well-nourished. No distress.  HENT:  Head: Normocephalic and atraumatic.  Right Ear: External ear normal.  Left Ear: External ear normal.  Nose: Nose normal.  Mouth/Throat: Oropharynx is clear and moist. No oropharyngeal exudate.  Eyes: Conjunctivae and EOM are normal. Pupils are equal, round, and reactive to light. Right eye exhibits no discharge. Left eye exhibits no discharge. No scleral icterus.  Neck: Normal range of motion. Neck supple. No tracheal deviation present. No thyromegaly present.  Cardiovascular: Normal rate, regular rhythm and normal heart sounds.  Exam reveals no gallop and no friction rub.   No murmur heard. Pulmonary/Chest: Effort normal and breath sounds normal. No respiratory distress. He has no wheezes. He has no rales. He exhibits no tenderness.  Musculoskeletal: Normal range of motion. He exhibits no edema.  Lymphadenopathy:    He has no cervical adenopathy.  Neurological: He is alert and oriented to person, place, and time. No cranial nerve deficit. Coordination normal.  Skin: Skin is warm and dry. No rash noted. He is not diaphoretic. No erythema. No pallor.  Psychiatric: He has a normal mood and affect. His behavior is normal. Judgment and thought content normal.  Assessment & Plan:

## 2012-04-26 ENCOUNTER — Other Ambulatory Visit (INDEPENDENT_AMBULATORY_CARE_PROVIDER_SITE_OTHER): Payer: 59

## 2012-04-26 DIAGNOSIS — E119 Type 2 diabetes mellitus without complications: Secondary | ICD-10-CM

## 2012-04-26 LAB — COMPREHENSIVE METABOLIC PANEL
ALT: 28 U/L (ref 0–53)
CO2: 26 mEq/L (ref 19–32)
Calcium: 9 mg/dL (ref 8.4–10.5)
Chloride: 104 mEq/L (ref 96–112)
Creatinine, Ser: 0.9 mg/dL (ref 0.4–1.5)
GFR: 94.82 mL/min (ref >60.00)
Total Protein: 6.7 g/dL (ref 6.0–8.3)

## 2012-04-26 LAB — LIPID PANEL
HDL: 36.3 mg/dL — ABNORMAL LOW (ref >39.00)
Total CHOL/HDL Ratio: 4
VLDL: 29.4 mg/dL (ref 0.0–40.0)

## 2012-04-26 LAB — MICROALBUMIN / CREATININE URINE RATIO: Microalb Creat Ratio: 0.4 mg/g (ref 0.0–30.0)

## 2012-04-30 ENCOUNTER — Encounter: Payer: Self-pay | Admitting: *Deleted

## 2012-05-02 ENCOUNTER — Ambulatory Visit (INDEPENDENT_AMBULATORY_CARE_PROVIDER_SITE_OTHER): Payer: 59 | Admitting: Internal Medicine

## 2012-05-02 ENCOUNTER — Encounter: Payer: Self-pay | Admitting: Internal Medicine

## 2012-05-02 VITALS — BP 122/80 | HR 87 | Temp 98.2°F | Ht 68.0 in | Wt 159.8 lb

## 2012-05-02 DIAGNOSIS — E119 Type 2 diabetes mellitus without complications: Secondary | ICD-10-CM

## 2012-05-02 DIAGNOSIS — E785 Hyperlipidemia, unspecified: Secondary | ICD-10-CM

## 2012-05-02 MED ORDER — GLUCOSE BLOOD VI STRP: ORAL_STRIP | Status: DC

## 2012-05-02 NOTE — Progress Notes (Signed)
  Subjective:    Patient ID: Jim [name], male    DOB: 02-15-1966, 46 y.o.   MRN: [id number]  HPI 45 year old male with history of diabetes, hyperlipidemia presents for followup. He reports he has been doing well. He notes that blood sugars have generally been well-controlled, typically near 100 fasting. He has had a few blood sugars less than 100 which were symptomatic with some fatigue. On those days he limited his metformin to once daily. He denies any blood sugars greater than 200. He has been following a healthy diet. He is physically active. He has no new concerns today.  Outpatient Encounter Prescriptions as of 05/02/2012  Medication Sig Dispense Refill  . acetaminophen (TYLENOL) 500 MG tablet Take 500 mg by mouth every 6 (six) hours as needed.      Marland Kitchen glucose blood (ACCU-CHEK AVIVA) test strip Use as instructed  100 each  12  . metFORMIN (GLUCOPHAGE) 500 MG tablet Take 1 tablet (500 mg total) by mouth 2 (two) times daily with a meal.  180 tablet  3   BP 122/80  Pulse 87  Temp 98.2 F (36.8 C) (Oral)  Ht 5\' 8"  (1.727 m)  Wt 159 lb 12 oz (72.462 kg)  BMI 24.29 kg/m2  SpO2 97%  Review of Systems  Constitutional: Negative for fever, chills, activity change, appetite change, fatigue and unexpected weight change.  Eyes: Negative for visual disturbance.  Respiratory: Negative for cough and shortness of breath.   Cardiovascular: Negative for chest pain, palpitations and leg swelling.  Gastrointestinal: Negative for abdominal pain and abdominal distention.  Genitourinary: Negative for dysuria, urgency and difficulty urinating.  Musculoskeletal: Negative for arthralgias and gait problem.  Skin: Negative for color change and rash.  Hematological: Negative for adenopathy.  Psychiatric/Behavioral: Negative for disturbed wake/sleep cycle and dysphoric mood. The patient is not nervous/anxious.        Objective:   Physical Exam  Constitutional: He is oriented to person, place, and  time. He appears well-developed and well-nourished. No distress.  HENT:  Head: Normocephalic and atraumatic.  Right Ear: External ear normal.  Left Ear: External ear normal.  Nose: Nose normal.  Mouth/Throat: Oropharynx is clear and moist. No oropharyngeal exudate.  Eyes: Conjunctivae normal and EOM are normal. Pupils are equal, round, and reactive to light. Right eye exhibits no discharge. Left eye exhibits no discharge. No scleral icterus.  Neck: Normal range of motion. Neck supple. No tracheal deviation present. No thyromegaly present.  Cardiovascular: Normal rate, regular rhythm and normal heart sounds.  Exam reveals no gallop and no friction rub.   No murmur heard. Pulmonary/Chest: Effort normal and breath sounds normal. No respiratory distress. He has no wheezes. He has no rales. He exhibits no tenderness.  Musculoskeletal: Normal range of motion. He exhibits no edema.  Lymphadenopathy:    He has no cervical adenopathy.  Neurological: He is alert and oriented to person, place, and time. No cranial nerve deficit. Coordination normal.  Skin: Skin is warm and dry. No rash noted. He is not diaphoretic. No erythema. No pallor.  Psychiatric: He has a normal mood and affect. His behavior is normal. Judgment and thought content normal.          Assessment & Plan:

## 2012-05-02 NOTE — Assessment & Plan Note (Signed)
Recent lab work shows improvement of LDL, however still just above goal of less than 70. Patient would prefer not to start statin medication. He will continue efforts at healthy diet and regular physical activity. He'll followup with repeat labs in 6 months.

## 2012-05-02 NOTE — Assessment & Plan Note (Signed)
Blood sugars have been extremely well controlled on metformin with last 2 hemoglobin A1c readings at 6.3%. Will continue metformin. We discussed extending followup to every 6 months. He will call if he sees increase in his blood sugars or has any other concerns in the interim between visits. He has scheduled ophthalmology evaluation tomorrow.

## 2012-08-07 ENCOUNTER — Encounter: Payer: Self-pay | Admitting: Adult Health

## 2012-08-07 ENCOUNTER — Ambulatory Visit (INDEPENDENT_AMBULATORY_CARE_PROVIDER_SITE_OTHER): Payer: 59 | Admitting: Adult Health

## 2012-08-07 VITALS — BP 133/73 | HR 106 | Temp 98.6°F | Resp 16 | Ht 69.0 in | Wt 160.0 lb

## 2012-08-07 DIAGNOSIS — J329 Chronic sinusitis, unspecified: Secondary | ICD-10-CM | POA: Insufficient documentation

## 2012-08-07 DIAGNOSIS — J111 Influenza due to unidentified influenza virus with other respiratory manifestations: Secondary | ICD-10-CM

## 2012-08-07 DIAGNOSIS — R6889 Other general symptoms and signs: Secondary | ICD-10-CM

## 2012-08-07 LAB — POCT INFLUENZA A/B: Influenza A, POC: NEGATIVE

## 2012-08-07 MED ORDER — FLUTICASONE PROPIONATE 50 MCG/ACT NA SUSP: NASAL | Status: DC

## 2012-08-07 MED ORDER — AMOXICILLIN-POT CLAVULANATE 875-125 MG PO TABS: 1.0000 | ORAL_TABLET | Freq: Two times a day (BID) | ORAL | Status: DC

## 2012-08-07 NOTE — Progress Notes (Signed)
  Subjective:    Patient ID: Jim [name], male    DOB: 06/12/1966, 48 y.o.   MRN: [id number]  HPI  Patient is a pleasant 47 y/o male who presents to clinic with abrupt onset of fever, sore throat, congestion. Symptoms started yesterday. He has taken some tylenol. Pt reports he has not had the flu shot this season.   Current Outpatient Prescriptions on File Prior to Visit  Medication Sig Dispense Refill  . acetaminophen (TYLENOL) 500 MG tablet Take 500 mg by mouth every 6 (six) hours as needed.      Marland Kitchen glucose blood (ACCU-CHEK AVIVA) test strip Use as instructed  100 each  12  . metFORMIN (GLUCOPHAGE) 500 MG tablet Take 1 tablet (500 mg total) by mouth 2 (two) times daily with a meal.  180 tablet  3  . Bioflavonoid Products (GRAPE SEED PO) Take 1 tablet by mouth daily.        Marland Kitchen glipiZIDE (GLUCOTROL) 5 MG tablet Take 5 mg by mouth daily as needed.         Review of Systems  Constitutional: Positive for fever and chills.  HENT: Positive for congestion, sore throat, rhinorrhea, postnasal drip and sinus pressure.   Respiratory: Positive for cough and shortness of breath. Negative for wheezing.   Gastrointestinal: Positive for nausea.  Genitourinary: Negative.   Neurological: Positive for headaches. Negative for dizziness and light-headedness.  Psychiatric/Behavioral: Negative.     BP 133/73  Pulse 106  Temp 98.6 F (37 C) (Oral)  Resp 16  Ht 5\' 9"  (1.753 m)  Wt 160 lb (72.576 kg)  BMI 23.63 kg/m2  SpO2 99%     Objective:   Physical Exam  Constitutional: He is oriented to person, place, and time. He appears well-developed and well-nourished. No distress.  HENT:  Head: Normocephalic and atraumatic.  Neck: Normal range of motion. No tracheal deviation present.  Cardiovascular: Normal rate, regular rhythm, normal heart sounds and intact distal pulses.  Exam reveals no gallop.   No murmur heard. Pulmonary/Chest: Effort normal and breath sounds normal. No respiratory distress.  He has no wheezes. He has no rales.  Abdominal: Soft. Bowel sounds are normal.  Lymphadenopathy:    He has cervical adenopathy.  Neurological: He is alert and oriented to person, place, and time.  Skin: Skin is warm and dry.  Psychiatric: He has a normal mood and affect. His behavior is normal. Judgment and thought content normal.          Assessment & Plan:

## 2012-08-07 NOTE — Patient Instructions (Addendum)
   Start antibiotic today. Also start flonase 2 sprays in each nostril daily.  Flush sinuses daily. You can use a simple saline spray that is sold OTC.  You can use Afrin nasal decongestant spray for 3 days.   You can also take a nasal decongestant such as Sudafed for your congestion.  For throat irritation you can gargle with salt water. A teaspoon of honey may also provide relief.  Chloraseptic throat spray or lozenges are available over the counter and will provide relief.  Drink fluids to stay hydrated.  For a cough you can try some Delsym or Robitussin DM or a generic brand that also has DM as part of the name.

## 2012-08-07 NOTE — Assessment & Plan Note (Addendum)
Patient has history of severe sinus infection. He presents with c/o fever treated with tylenol. Start Augmentin, flonase and sinus flushes with saline spray. May use Afrin x 3 days as well as oral decongestant such as Sudafed. Salt water gargles for throat irritation. Chloraseptic lozenges will also provide relief. RTC if symptoms do not improve within 3-4 days.

## 2012-08-22 ENCOUNTER — Telehealth: Payer: Self-pay | Admitting: Internal Medicine

## 2012-08-22 NOTE — Telephone Encounter (Signed)
Patient Information:  Caller Name: Massimiliano  Phone: [phone]  Patient: Jim [name], Jim [name]  Gender: Male  DOB: 09-28-1965  Age: 47 Years  PCP: Ronna Polio (Adults only)  Office Follow Up:  Does the office need to follow up with this patient?: No  Instructions For The Office: N/A  RN Note:  Patient given home care instructions and close mointoring of GBS. Advised to take Metformin and start liquids slowly.  Patient is going to closely mointor signs and symptoms.  He will locate thermometer for accurate temperature.  Patient is aware to call back for questions, changes or concerns.  Symptoms  Reason For Call & Symptoms: Patient states awoke at 03:00 with stomach cramping, nausea .Location- all over mostley middle.  After 08:00 x1 bout of emesis. +bodyaches. No diarrhea.  GBS # at #230.  He is normally 150. He takes Metformin but has not taken it today.  Tired and feels thirsty. AA0X3.  Unsure of fever, felt warm but feels better after taking aleve  Reviewed Health History In EMR: Yes  Reviewed Medications In EMR: Yes  Reviewed Allergies In EMR: Yes  Reviewed Surgeries / Procedures: No  Date of Onset of Symptoms: 08/22/2012  Treatments Tried: tums, aleve. Drinking diet soft drinks, crushed ice  Treatments Tried Worked: No  Any Fever: Yes  Fever Taken: Tactile  Fever Time Of Reading: 08:00:00  Fever Last Reading: N/A  Guideline(s) Used:  Abdominal Pain - Male  Diabetes - High Blood Sugar  Disposition Per Guideline:   Home Care  Reason For Disposition Reached:   Mild abdominal pain  Advice Given:  General  Definition of hyperglycemia: - Fasting blood glucose more than 140 mg/dL (7.5 mmol/l) or random blood glucose more than 200 mg/dL (11 mmol/l).  Symptoms of mild hyperglycemia: frequent urination, increased thirst, fatigue, blurred vision.  Treatment - Liquids  Drink at least one glass (8 oz or 240 ml) of water per hour for the next 4 hours. (Reason: adequate hydration will  reduce hyperglycemia).  Generally, you should try to drink 6-8 glasses of water each day.  Treatment - Diabetes Medications  : Continue taking your diabetes pills.  Call Back If:  Blood glucose more than 300 mg/dL (09.8 mmol/l), 2 or more times in a row.  Vomiting lasting more than 4 hours or unable to drink any liquids.  Rapid breathing occurs  You become worse.  Fluids:  Sip clear fluids only (e.g., water, flat soft drinks or half-strength fruit juice) until the pain has been gone for over 2 hours. Then slowly return to a regular diet.  Rest:  Lie down and rest until you feel better.  Diet:  Slowly advance diet from clear liquids to a bland diet  Avoid alcohol or caffeinated beverages  Avoid greasy or fatty foods  Call Back If:  Abdominal pain is constant and present for more than 2 hours  Abdominal pains come and go and are present for more than 24 hours  You become worse.

## 2012-09-13 ENCOUNTER — Other Ambulatory Visit: Payer: Self-pay | Admitting: Internal Medicine

## 2012-09-16 ENCOUNTER — Other Ambulatory Visit: Payer: Self-pay | Admitting: *Deleted

## 2012-09-16 MED ORDER — METFORMIN HCL 500 MG PO TABS: 500.0000 mg | ORAL_TABLET | Freq: Two times a day (BID) | ORAL | Status: DC

## 2012-10-29 ENCOUNTER — Other Ambulatory Visit (INDEPENDENT_AMBULATORY_CARE_PROVIDER_SITE_OTHER): Payer: 59

## 2012-10-29 DIAGNOSIS — E119 Type 2 diabetes mellitus without complications: Secondary | ICD-10-CM

## 2012-10-29 LAB — COMPREHENSIVE METABOLIC PANEL
AST: 19 U/L (ref 0–37)
Alkaline Phosphatase: 76 U/L (ref 39–117)
BUN: 20 mg/dL (ref 6–23)
Creatinine, Ser: 1 mg/dL (ref 0.4–1.5)
Total Bilirubin: 0.5 mg/dL (ref 0.3–1.2)

## 2012-10-29 LAB — LIPID PANEL
HDL: 32.3 mg/dL — ABNORMAL LOW (ref >39.00)
LDL Cholesterol: 98 mg/dL (ref 0–99)
Total CHOL/HDL Ratio: 5
Triglycerides: 171 mg/dL — ABNORMAL HIGH (ref 0.0–149.0)

## 2012-11-05 ENCOUNTER — Ambulatory Visit: Payer: 59 | Admitting: Internal Medicine

## 2012-11-06 ENCOUNTER — Ambulatory Visit: Payer: 59 | Admitting: Internal Medicine

## 2012-11-27 LAB — HM DIABETES EYE EXAM

## 2012-12-05 ENCOUNTER — Encounter: Payer: Self-pay | Admitting: Internal Medicine

## 2012-12-05 ENCOUNTER — Ambulatory Visit (INDEPENDENT_AMBULATORY_CARE_PROVIDER_SITE_OTHER): Payer: 59 | Admitting: Internal Medicine

## 2012-12-05 VITALS — BP 100/70 | HR 95 | Temp 98.2°F | Wt 159.0 lb

## 2012-12-05 DIAGNOSIS — E119 Type 2 diabetes mellitus without complications: Secondary | ICD-10-CM

## 2012-12-05 MED ORDER — METFORMIN HCL 500 MG PO TABS: 500.0000 mg | ORAL_TABLET | Freq: Two times a day (BID) | ORAL | Status: DC

## 2012-12-05 NOTE — Assessment & Plan Note (Signed)
Recent increase in blood sugars likely secondary to poor compliance with diet and metformin. Encouraged compliance with diet and metformin 500mg  twice daily. Will repeat A1c in 01/2013.

## 2012-12-05 NOTE — Progress Notes (Signed)
Subjective:    Patient ID: Jim [name], male    DOB: [birth year], 47 y.o.   MRN: [id number]  HPI 47YO male with DM, HL presents for follow up. Recent A1c elevated 7.9%, which was increase from previous values near 6%. Pt notes some non-compliance with diet and metformin over last few months. Was also sick on several occasions. No new concerns today.  Outpatient Encounter Prescriptions as of 12/05/2012  Medication Sig Dispense Refill  . acetaminophen (TYLENOL) 500 MG tablet Take 500 mg by mouth every 6 (six) hours as needed.      . fluticasone (FLONASE) 50 MCG/ACT nasal spray 2 sprays into each nostril daily.  16 g  6  . glucose blood (ACCU-CHEK AVIVA) test strip Use as instructed  100 each  12  . metFORMIN (GLUCOPHAGE) 500 MG tablet Take 1 tablet (500 mg total) by mouth 2 (two) times daily with a meal.  180 tablet  4  . [DISCONTINUED] metFORMIN (GLUCOPHAGE) 500 MG tablet Take 1 tablet (500 mg total) by mouth 2 (two) times daily with a meal.  180 tablet  1  . Bioflavonoid Products (GRAPE SEED PO) Take 1 tablet by mouth daily.        Marland Kitchen glipiZIDE (GLUCOTROL) 5 MG tablet Take 5 mg by mouth daily as needed.       No facility-administered encounter medications on file as of 12/05/2012.   BP 100/70  Pulse 95  Temp(Src) 98.2 F (36.8 C) (Oral)  Wt 159 lb (72.122 kg)  BMI 23.47 kg/m2  SpO2 97%   Review of Systems  Constitutional: Negative for fever, chills, activity change, appetite change, fatigue and unexpected weight change.  Eyes: Negative for visual disturbance.  Respiratory: Negative for cough and shortness of breath.   Cardiovascular: Negative for chest pain, palpitations and leg swelling.  Gastrointestinal: Negative for abdominal pain and abdominal distention.  Genitourinary: Negative for dysuria, urgency and difficulty urinating.  Musculoskeletal: Negative for arthralgias and gait problem.  Skin: Negative for color change and rash.  Hematological: Negative for adenopathy.   Psychiatric/Behavioral: Negative for sleep disturbance and dysphoric mood. The patient is not nervous/anxious.        Objective:   Physical Exam  Constitutional: He is oriented to person, place, and time. He appears well-developed and well-nourished. No distress.  HENT:  Head: Normocephalic and atraumatic.  Right Ear: External ear normal.  Left Ear: External ear normal.  Nose: Nose normal.  Mouth/Throat: Oropharynx is clear and moist. No oropharyngeal exudate.  Eyes: Conjunctivae and EOM are normal. Pupils are equal, round, and reactive to light. Right eye exhibits no discharge. Left eye exhibits no discharge. No scleral icterus.  Neck: Normal range of motion. Neck supple. No tracheal deviation present. No thyromegaly present.  Cardiovascular: Normal rate, regular rhythm and normal heart sounds.  Exam reveals no gallop and no friction rub.   No murmur heard. Pulmonary/Chest: Effort normal and breath sounds normal. No respiratory distress. He has no wheezes. He has no rales. He exhibits no tenderness.  Musculoskeletal: Normal range of motion. He exhibits no edema.  Lymphadenopathy:    He has no cervical adenopathy.  Neurological: He is alert and oriented to person, place, and time. No cranial nerve deficit. Coordination normal.  Skin: Skin is warm and dry. No rash noted. He is not diaphoretic. No erythema. No pallor.  Psychiatric: He has a normal mood and affect. His behavior is normal. Judgment and thought content normal.  Assessment & Plan:

## 2013-02-06 ENCOUNTER — Other Ambulatory Visit (INDEPENDENT_AMBULATORY_CARE_PROVIDER_SITE_OTHER): Payer: 59

## 2013-02-06 DIAGNOSIS — E119 Type 2 diabetes mellitus without complications: Secondary | ICD-10-CM

## 2013-02-06 LAB — COMPREHENSIVE METABOLIC PANEL
AST: 18 U/L (ref 0–37)
Alkaline Phosphatase: 71 U/L (ref 39–117)
BUN: 20 mg/dL (ref 6–23)
Calcium: 9.1 mg/dL (ref 8.4–10.5)
Chloride: 105 mEq/L (ref 96–112)
Creatinine, Ser: 1 mg/dL (ref 0.4–1.5)

## 2013-02-06 LAB — HEMOGLOBIN A1C: Hgb A1c MFr Bld: 7.8 % — ABNORMAL HIGH (ref 4.6–6.5)

## 2013-02-27 ENCOUNTER — Ambulatory Visit (INDEPENDENT_AMBULATORY_CARE_PROVIDER_SITE_OTHER): Payer: 59 | Admitting: Internal Medicine

## 2013-02-27 ENCOUNTER — Encounter: Payer: Self-pay | Admitting: Internal Medicine

## 2013-02-27 VITALS — BP 112/90 | HR 72 | Temp 98.3°F | Wt 161.0 lb

## 2013-02-27 DIAGNOSIS — E785 Hyperlipidemia, unspecified: Secondary | ICD-10-CM

## 2013-02-27 DIAGNOSIS — Z23 Encounter for immunization: Secondary | ICD-10-CM

## 2013-02-27 DIAGNOSIS — R030 Elevated blood-pressure reading, without diagnosis of hypertension: Secondary | ICD-10-CM

## 2013-02-27 DIAGNOSIS — E119 Type 2 diabetes mellitus without complications: Secondary | ICD-10-CM

## 2013-02-27 DIAGNOSIS — IMO0001 Reserved for inherently not codable concepts without codable children: Secondary | ICD-10-CM | POA: Insufficient documentation

## 2013-02-27 LAB — HM DIABETES FOOT EXAM: HM Diabetic Foot Exam: NORMAL

## 2013-02-27 NOTE — Assessment & Plan Note (Signed)
Lab Results  Component Value Date   HGBA1C 7.8* 02/06/2013   Blood sugars are elevated compared to previous. Recommended more consistency with use of glipizide. Patient will start taking this on a daily basis. We discussed potentially adding additional medications with no improvement in blood sugars. Followup in 3 months or sooner as needed.

## 2013-02-27 NOTE — Assessment & Plan Note (Signed)
BP Readings from Last 3 Encounters:  02/27/13 112/90  12/05/12 100/70  08/07/12 133/73   Blood pressure is elevated compared to previous. In the past, history of old with hypotension. We'll monitor closely. We discussed guidelines recommending use of ACE inhibitors in patients with diabetes. He prefers to limit use of medications as much as possible. Will monitor BP closely. Will repeat urine microalbumin with labs in 3 months.

## 2013-02-27 NOTE — Progress Notes (Signed)
Subjective:    Patient ID: Jim [name], male    DOB: 15-Jul-1966, 47 y.o.   MRN: [id number]  HPI 47 year old male with history of diabetes presents for followup. He reports he is generally feeling well. He continues to take metformin 500 mg twice daily. He only rarely takes glipizide. He had stopped taking this medication because in the past he was having low blood sugars, less than 50 with taking glipizide. He notes some dietary indiscretion recently. Recent A1c was elevated at 7.9%. This is a significant increase compared to previous values which have been near 6.2%. He denies polyuria. He denies any new concerns today.  Outpatient Encounter Prescriptions as of 02/27/2013  Medication Sig Dispense Refill  . acetaminophen (TYLENOL) 500 MG tablet Take 500 mg by mouth every 6 (six) hours as needed.      . fluticasone (FLONASE) 50 MCG/ACT nasal spray 2 sprays into each nostril daily.  16 g  6  . glipiZIDE (GLUCOTROL) 5 MG tablet Take 5 mg by mouth daily as needed.      Marland Kitchen glucose blood (ACCU-CHEK AVIVA) test strip Use as instructed  100 each  12  . metFORMIN (GLUCOPHAGE) 500 MG tablet Take 1 tablet (500 mg total) by mouth 2 (two) times daily with a meal.  180 tablet  4  . Bioflavonoid Products (GRAPE SEED PO) Take 1 tablet by mouth daily.         No facility-administered encounter medications on file as of 02/27/2013.   BP 112/90  Pulse 72  Temp(Src) 98.3 F (36.8 C) (Oral)  Wt 161 lb (73.029 kg)  BMI 23.76 kg/m2  Review of Systems  Constitutional: Negative for fever, chills, activity change, appetite change, fatigue and unexpected weight change.  Eyes: Negative for visual disturbance.  Respiratory: Negative for cough and shortness of breath.   Cardiovascular: Negative for chest pain, palpitations and leg swelling.  Gastrointestinal: Negative for abdominal pain and abdominal distention.  Genitourinary: Negative for dysuria, urgency and difficulty urinating.  Musculoskeletal: Negative for  arthralgias and gait problem.  Skin: Negative for color change and rash.  Hematological: Negative for adenopathy.  Psychiatric/Behavioral: Negative for sleep disturbance and dysphoric mood. The patient is not nervous/anxious.        Objective:   Physical Exam  Constitutional: He is oriented to person, place, and time. He appears well-developed and well-nourished. No distress.  HENT:  Head: Normocephalic and atraumatic.  Right Ear: External ear normal.  Left Ear: External ear normal.  Nose: Nose normal.  Mouth/Throat: Oropharynx is clear and moist. No oropharyngeal exudate.  Eyes: Conjunctivae and EOM are normal. Pupils are equal, round, and reactive to light. Right eye exhibits no discharge. Left eye exhibits no discharge. No scleral icterus.  Neck: Normal range of motion. Neck supple. No tracheal deviation present. No thyromegaly present.  Cardiovascular: Normal rate, regular rhythm and normal heart sounds.  Exam reveals no gallop and no friction rub.   No murmur heard. Pulmonary/Chest: Effort normal and breath sounds normal. No respiratory distress. He has no wheezes. He has no rales. He exhibits no tenderness.  Musculoskeletal: Normal range of motion. He exhibits no edema.  Lymphadenopathy:    He has no cervical adenopathy.  Neurological: He is alert and oriented to person, place, and time. No cranial nerve deficit. Coordination normal.  Skin: Skin is warm and dry. No rash noted. He is not diaphoretic. No erythema. No pallor.  Psychiatric: He has a normal mood and affect. His behavior is normal. Judgment and  thought content normal.          Assessment & Plan:

## 2013-02-27 NOTE — Assessment & Plan Note (Signed)
Discussed guidelines recommending use of statin medication and diabetic patients. Patient declined use of statin. Will plan to recheck lipids and LFTs with labs in 3 months.Reviewed new cholesterol guidelines including recommendation for Mediterranean diet and exercise, with goal of vigorous physical activity 3x per week at minimum.

## 2013-04-24 ENCOUNTER — Ambulatory Visit (INDEPENDENT_AMBULATORY_CARE_PROVIDER_SITE_OTHER): Payer: 59 | Admitting: Internal Medicine

## 2013-04-24 ENCOUNTER — Encounter: Payer: Self-pay | Admitting: Internal Medicine

## 2013-04-24 VITALS — BP 128/82 | HR 88 | Temp 97.7°F | Wt 161.5 lb

## 2013-04-24 DIAGNOSIS — S6010XA Contusion of unspecified finger with damage to nail, initial encounter: Secondary | ICD-10-CM | POA: Insufficient documentation

## 2013-04-24 DIAGNOSIS — N529 Male erectile dysfunction, unspecified: Secondary | ICD-10-CM

## 2013-04-24 DIAGNOSIS — S6000XA Contusion of unspecified finger without damage to nail, initial encounter: Secondary | ICD-10-CM

## 2013-04-24 MED ORDER — SILDENAFIL CITRATE 100 MG PO TABS: 100.0000 mg | ORAL_TABLET | Freq: Every day | ORAL | Status: DC | PRN

## 2013-04-24 NOTE — Progress Notes (Signed)
  Subjective:    Patient ID: Jim [name], male    DOB: [birth year], 47 y.o.   MRN: [id number]  HPI Hand slipped and he closed his [facility] door on it 3 mornings ago Some swelling and decreased sensation Not improving like he expected Foster out of side of the nail at first  Nail is black Mild inflammation along the nail bed Tight and tingling  Put ice on it initially --for the first day Has been using ibuprofen 400mg  daily or bid  Current Outpatient Prescriptions on File Prior to Visit  Medication Sig Dispense Refill  . acetaminophen (TYLENOL) 500 MG tablet Take 500 mg by mouth every 6 (six) hours as needed.      Marland Kitchen Bioflavonoid Products (GRAPE SEED PO) Take 1 tablet by mouth daily.        . fluticasone (FLONASE) 50 MCG/ACT nasal spray 2 sprays into each nostril daily.  16 g  6  . glipiZIDE (GLUCOTROL) 5 MG tablet Take 5 mg by mouth daily as needed.      Marland Kitchen glucose blood (ACCU-CHEK AVIVA) test strip Use as instructed  100 each  12  . metFORMIN (GLUCOPHAGE) 500 MG tablet Take 1 tablet (500 mg total) by mouth 2 (two) times daily with a meal.  180 tablet  4   No current facility-administered medications on file prior to visit.    Allergies  Allergen Reactions  . Ciprofloxacin Rash    Past Medical History  Diagnosis Date  . Diabetes mellitus 2012  . GERD (gastroesophageal reflux disease)     Past Surgical History  Procedure Laterality Date  . Closed reduction ankle fracture      Family History  Problem Relation Age of Onset  . Diabetes Brother   . Parkinsonism Maternal Grandfather   . Stroke Paternal Grandfather      Review of Systems Has been able to work but some restrictions Hard to write    Objective:   Physical Exam  Constitutional: He appears well-developed and well-nourished. No distress.  Musculoskeletal:  Redness with mild swelling in distal phalanx of right 2nd finger Subungual hematoma Moderate tenderness          Assessment & Plan:

## 2013-04-24 NOTE — Assessment & Plan Note (Signed)
Interested in meds viagra coupon given for trial

## 2013-04-24 NOTE — Patient Instructions (Signed)
Please ask the pharmacist if your insurance will cover the 20mg  sildenafil

## 2013-04-24 NOTE — Assessment & Plan Note (Signed)
burrhole made with scalpel with evacuation of moderate amount of blood Swelling better  Could have distal phalanx fracture but it wouldn't change Rx Extends finger fully

## 2013-05-01 ENCOUNTER — Other Ambulatory Visit: Payer: Self-pay | Admitting: Internal Medicine

## 2013-05-29 ENCOUNTER — Other Ambulatory Visit (INDEPENDENT_AMBULATORY_CARE_PROVIDER_SITE_OTHER): Payer: 59

## 2013-05-29 DIAGNOSIS — E785 Hyperlipidemia, unspecified: Secondary | ICD-10-CM

## 2013-05-29 DIAGNOSIS — E119 Type 2 diabetes mellitus without complications: Secondary | ICD-10-CM

## 2013-05-29 LAB — COMPREHENSIVE METABOLIC PANEL
Albumin: 4 g/dL (ref 3.5–5.2)
CO2: 26 mEq/L (ref 19–32)
Calcium: 9.3 mg/dL (ref 8.4–10.5)
Chloride: 102 mEq/L (ref 96–112)
GFR: 96.78 mL/min (ref >60.00)
Glucose, Bld: 143 mg/dL — ABNORMAL HIGH (ref 70–99)
Potassium: 4.4 mEq/L (ref 3.5–5.1)
Sodium: 136 mEq/L (ref 135–145)
Total Bilirubin: 0.7 mg/dL (ref 0.3–1.2)
Total Protein: 6.9 g/dL (ref 6.0–8.3)

## 2013-05-29 LAB — LIPID PANEL
Total CHOL/HDL Ratio: 5
VLDL: 62.4 mg/dL — ABNORMAL HIGH (ref 0.0–40.0)

## 2013-05-29 LAB — LDL CHOLESTEROL, DIRECT: Direct LDL: 99.9 mg/dL

## 2013-05-29 LAB — HEMOGLOBIN A1C: Hgb A1c MFr Bld: 7.4 % — ABNORMAL HIGH (ref 4.6–6.5)

## 2013-06-05 ENCOUNTER — Encounter: Payer: Self-pay | Admitting: Internal Medicine

## 2013-06-05 ENCOUNTER — Ambulatory Visit (INDEPENDENT_AMBULATORY_CARE_PROVIDER_SITE_OTHER): Payer: 59 | Admitting: Internal Medicine

## 2013-06-05 ENCOUNTER — Encounter (INDEPENDENT_AMBULATORY_CARE_PROVIDER_SITE_OTHER): Payer: Self-pay

## 2013-06-05 VITALS — BP 108/70 | HR 85 | Temp 98.3°F | Wt 160.0 lb

## 2013-06-05 DIAGNOSIS — S6010XD Contusion of unspecified finger with damage to nail, subsequent encounter: Secondary | ICD-10-CM

## 2013-06-05 DIAGNOSIS — Z5189 Encounter for other specified aftercare: Secondary | ICD-10-CM

## 2013-06-05 DIAGNOSIS — E785 Hyperlipidemia, unspecified: Secondary | ICD-10-CM

## 2013-06-05 DIAGNOSIS — E119 Type 2 diabetes mellitus without complications: Secondary | ICD-10-CM

## 2013-06-05 NOTE — Progress Notes (Signed)
Pre-visit discussion using our clinic review tool. No additional management support is needed unless otherwise documented below in the visit note.  

## 2013-06-05 NOTE — Progress Notes (Addendum)
Subjective:    Patient ID: Jim [name], male    DOB: 1965/11/13, 47 y.o.   MRN: [id number]  HPI 47 year old male with history of diabetes presents for followup. He reports that he is generally feeling well. Recent A1c was noted to be slightly improved at 7.4%. He is compliant with his medications. He did not bring record of his blood sugars today. He denies any new concerns today.  He also notes that he recently [place] his right index [place] door [place]. He was seen one of our other offices and had drainage of hematoma under his nail. He reports that symptoms of swelling and pain have improved.  Outpatient Prescriptions Prior to Visit  Medication Sig Dispense Refill  . acetaminophen (TYLENOL) 500 MG tablet Take 500 mg by mouth every 6 (six) hours as needed.      . fluticasone (FLONASE) 50 MCG/ACT nasal spray 2 sprays into each nostril daily.  16 g  6  . glipiZIDE (GLUCOTROL) 5 MG tablet Take 5 mg by mouth daily as needed.      . metFORMIN (GLUCOPHAGE) 500 MG tablet Take 1 tablet (500 mg total) by mouth 2 (two) times daily with a meal.  180 tablet  4  . sildenafil (VIAGRA) 100 MG tablet Take 1 tablet (100 mg total) by mouth daily as needed for erectile dysfunction.  3 tablet  5  . Bioflavonoid Products (GRAPE SEED PO) Take 1 tablet by mouth daily.         No facility-administered medications prior to visit.   BP 108/70  Pulse 85  Temp(Src) 98.3 F (36.8 C) (Oral)  Wt 160 lb (72.576 kg)  SpO2 96%  Review of Systems  Constitutional: Negative for fever, chills, activity change, appetite change, fatigue and unexpected weight change.  Eyes: Negative for visual disturbance.  Respiratory: Negative for cough and shortness of breath.   Cardiovascular: Negative for chest pain, palpitations and leg swelling.  Gastrointestinal: Negative for abdominal pain and abdominal distention.  Genitourinary: Negative for dysuria, urgency and difficulty urinating.  Musculoskeletal:  Negative for arthralgias and gait problem.  Skin: Negative for color change and rash.  Hematological: Negative for adenopathy.  Psychiatric/Behavioral: Negative for sleep disturbance and dysphoric mood. The patient is not nervous/anxious.        Objective:   Physical Exam  Constitutional: He is oriented to person, place, and time. He appears well-developed and well-nourished. No distress.  HENT:  Head: Normocephalic and atraumatic.  Right Ear: External ear normal.  Left Ear: External ear normal.  Nose: Nose normal.  Mouth/Throat: Oropharynx is clear and moist. No oropharyngeal exudate.  Eyes: Conjunctivae and EOM are normal. Pupils are equal, round, and reactive to light. Right eye exhibits no discharge. Left eye exhibits no discharge. No scleral icterus.  Neck: Normal range of motion. Neck supple. No tracheal deviation present. No thyromegaly present.  Cardiovascular: Normal rate, regular rhythm and normal heart sounds.  Exam reveals no gallop and no friction rub.   No murmur heard. Pulmonary/Chest: Effort normal and breath sounds normal. No respiratory distress. He has no wheezes. He has no rales. He exhibits no tenderness.  Musculoskeletal: Normal range of motion. He exhibits no edema.       Hands: Lymphadenopathy:    He has no cervical adenopathy.  Neurological: He is alert and oriented to person, place, and time. No cranial nerve deficit. Coordination normal.  Skin: Skin is warm and dry. No rash noted. He is not diaphoretic. No erythema.  No pallor.  Psychiatric: He has a normal mood and affect. His behavior is normal. Judgment and thought content normal.          Assessment & Plan:

## 2013-06-05 NOTE — Assessment & Plan Note (Signed)
Lab Results  Component Value Date   HGBA1C 7.4* 05/29/2013   Recent A1c shows improvement in blood sugars compared to previous. Will plan to continue metformin and glipizide. Eye exam and foot exam are up-to-date. Pneumovax is up-to-date. Patient declines flu vaccine.

## 2013-06-05 NOTE — Assessment & Plan Note (Signed)
Reviewed recent lipids which showed elevated triglycerides. Recommended starting statin medication for primary prevention of heart disease.. Patient declines.

## 2013-06-05 NOTE — Patient Instructions (Signed)

## 2013-06-05 NOTE — Assessment & Plan Note (Signed)
Hematoma and edema improved compared to previous based on patient report. Will continue to monitor.

## 2013-09-11 ENCOUNTER — Other Ambulatory Visit: Payer: 59

## 2013-09-16 ENCOUNTER — Other Ambulatory Visit (INDEPENDENT_AMBULATORY_CARE_PROVIDER_SITE_OTHER): Payer: 59

## 2013-09-16 ENCOUNTER — Telehealth: Payer: Self-pay | Admitting: *Deleted

## 2013-09-16 DIAGNOSIS — E1059 Type 1 diabetes mellitus with other circulatory complications: Secondary | ICD-10-CM

## 2013-09-16 LAB — COMPREHENSIVE METABOLIC PANEL
ALT: 29 U/L (ref 0–53)
AST: 18 U/L (ref 0–37)
Albumin: 4 g/dL (ref 3.5–5.2)
Alkaline Phosphatase: 69 U/L (ref 39–117)
BUN: 22 mg/dL (ref 6–23)
CALCIUM: 9.4 mg/dL (ref 8.4–10.5)
CHLORIDE: 102 meq/L (ref 96–112)
CO2: 27 meq/L (ref 19–32)
CREATININE: 1 mg/dL (ref 0.4–1.5)
GFR: 89.71 mL/min (ref >60.00)
GLUCOSE: 135 mg/dL — AB (ref 70–99)
Potassium: 4.2 mEq/L (ref 3.5–5.1)
Sodium: 136 mEq/L (ref 135–145)
Total Bilirubin: 0.8 mg/dL (ref 0.3–1.2)
Total Protein: 6.6 g/dL (ref 6.0–8.3)

## 2013-09-16 LAB — LIPID PANEL
CHOLESTEROL: 180 mg/dL (ref 0–200)
HDL: 38.4 mg/dL — ABNORMAL LOW (ref >39.00)
LDL Cholesterol: 111 mg/dL — ABNORMAL HIGH (ref 0–99)
TRIGLYCERIDES: 151 mg/dL — AB (ref 0.0–149.0)
Total CHOL/HDL Ratio: 5
VLDL: 30.2 mg/dL (ref 0.0–40.0)

## 2013-09-16 LAB — HEMOGLOBIN A1C: HEMOGLOBIN A1C: 8.2 % — AB (ref 4.6–6.5)

## 2013-09-16 NOTE — Telephone Encounter (Signed)
Cmp, a1c, lipids, urine micro/cr 250.00

## 2013-09-16 NOTE — Telephone Encounter (Signed)
What labs and dx?  

## 2013-09-18 ENCOUNTER — Ambulatory Visit (INDEPENDENT_AMBULATORY_CARE_PROVIDER_SITE_OTHER): Payer: 59 | Admitting: Internal Medicine

## 2013-09-18 ENCOUNTER — Encounter: Payer: Self-pay | Admitting: Internal Medicine

## 2013-09-18 VITALS — BP 112/78 | HR 99 | Temp 98.6°F | Wt 159.0 lb

## 2013-09-18 DIAGNOSIS — E785 Hyperlipidemia, unspecified: Secondary | ICD-10-CM

## 2013-09-18 DIAGNOSIS — E119 Type 2 diabetes mellitus without complications: Secondary | ICD-10-CM

## 2013-09-18 NOTE — Patient Instructions (Signed)
Take Glipizide daily.   Email with blood sugar readings.  Call if any blood sugars less than 70.

## 2013-09-18 NOTE — Progress Notes (Signed)
   Subjective:    Patient ID: Jim [name], male    DOB: December 22, 1965, 48 y.o.   MRN: [id number]  HPI 48YO male with DM presents for follow up. He has not recently been checking BG. Taking metformin, but has only taken Glipizide 4-5 times over last 4 months. No concerns today. Feeling well.   Review of Systems  Constitutional: Negative for fever, chills, activity change, appetite change, fatigue and unexpected weight change.  Eyes: Negative for visual disturbance.  Respiratory: Negative for cough and shortness of breath.   Cardiovascular: Negative for chest pain, palpitations and leg swelling.  Gastrointestinal: Negative for abdominal pain and abdominal distention.  Genitourinary: Negative for dysuria, urgency and difficulty urinating.  Musculoskeletal: Negative for arthralgias and gait problem.  Skin: Negative for color change and rash.  Hematological: Negative for adenopathy.  Psychiatric/Behavioral: Negative for sleep disturbance and dysphoric mood. The patient is not nervous/anxious.        Objective:    BP 112/78  Pulse 99  Temp(Src) 98.6 F (37 C) (Oral)  Wt 159 lb (72.122 kg)  SpO2 97% Physical Exam  Constitutional: He is oriented to person, place, and time. He appears well-developed and well-nourished. No distress.  HENT:  Head: Normocephalic and atraumatic.  Right Ear: External ear normal.  Left Ear: External ear normal.  Nose: Nose normal.  Mouth/Throat: Oropharynx is clear and moist. No oropharyngeal exudate.  Eyes: Conjunctivae and EOM are normal. Pupils are equal, round, and reactive to light. Right eye exhibits no discharge. Left eye exhibits no discharge. No scleral icterus.  Neck: Normal range of motion. Neck supple. No tracheal deviation present. No thyromegaly present.  Cardiovascular: Normal rate, regular rhythm and normal heart sounds.  Exam reveals no gallop and no friction rub.   No murmur heard. Pulmonary/Chest: Effort normal and breath sounds normal.  No accessory muscle usage. Not tachypneic. No respiratory distress. He has no decreased breath sounds. He has no wheezes. He has no rhonchi. He has no rales. He exhibits no tenderness.  Musculoskeletal: Normal range of motion. He exhibits no edema.  Lymphadenopathy:    He has no cervical adenopathy.  Neurological: He is alert and oriented to person, place, and time. No cranial nerve deficit. Coordination normal.  Skin: Skin is warm and dry. No rash noted. He is not diaphoretic. No erythema. No pallor.  Psychiatric: He has a normal mood and affect. His behavior is normal. Judgment and thought content normal.          Assessment & Plan:   Problem List Items Addressed This Visit   Diabetes mellitus type 2, controlled - Primary      Lab Results  Component Value Date   HGBA1C 8.2* 09/16/2013   BG more elevated recently, likely exacerbated by recent URI. Encouraged him to take Glipizide 2.5-5mg  daily in the morning with Metformin. He will monitor BG closely and call if any BG <70. Follow up 3 months and prn. Not currently on ACEi because of hypotension. Not on statin because of pt preference.    Hyperlipidemia LDL goal < 70     Lipids elevated above goal LDL<70. Recommended starting statin medication. Pt declines.        Return in about 3 months (around 12/19/2013) for Recheck of Diabetes.

## 2013-09-18 NOTE — Progress Notes (Signed)
Pre visit review using our clinic review tool, if applicable. No additional management support is needed unless otherwise documented below in the visit note. 

## 2013-09-18 NOTE — Assessment & Plan Note (Signed)
Lipids elevated above goal LDL<70. Recommended starting statin medication. Pt declines.

## 2013-09-18 NOTE — Assessment & Plan Note (Addendum)
Lab Results  Component Value Date   HGBA1C 8.2* 09/16/2013   BG more elevated recently, likely exacerbated by recent URI. Encouraged him to take Glipizide 2.5-5mg  daily in the morning with Metformin. He will monitor BG closely and call if any BG <70. Follow up 3 months and prn. Not currently on ACEi because of hypotension. Not on statin because of pt preference.

## 2013-09-19 ENCOUNTER — Telehealth: Payer: Self-pay

## 2013-09-19 NOTE — Telephone Encounter (Signed)
Relevant patient education assigned to patient using Emmi. ° °

## 2013-11-07 ENCOUNTER — Other Ambulatory Visit: Payer: Self-pay | Admitting: Internal Medicine

## 2013-12-24 ENCOUNTER — Telehealth: Payer: Self-pay | Admitting: *Deleted

## 2013-12-24 NOTE — Telephone Encounter (Signed)
Cmp, lipids, A1c, urine microalbumin/cr 272.4, 250.00

## 2013-12-24 NOTE — Telephone Encounter (Signed)
Pt is coming in tomorrow what labs and dx?  

## 2013-12-25 ENCOUNTER — Other Ambulatory Visit: Payer: Self-pay | Admitting: *Deleted

## 2013-12-25 ENCOUNTER — Other Ambulatory Visit (INDEPENDENT_AMBULATORY_CARE_PROVIDER_SITE_OTHER): Payer: 59

## 2013-12-25 DIAGNOSIS — E785 Hyperlipidemia, unspecified: Secondary | ICD-10-CM

## 2013-12-25 DIAGNOSIS — E119 Type 2 diabetes mellitus without complications: Secondary | ICD-10-CM

## 2013-12-25 LAB — COMPREHENSIVE METABOLIC PANEL
ALBUMIN: 4.1 g/dL (ref 3.5–5.2)
ALT: 32 U/L (ref 0–53)
AST: 25 U/L (ref 0–37)
Alkaline Phosphatase: 62 U/L (ref 39–117)
BUN: 26 mg/dL — AB (ref 6–23)
CALCIUM: 9.1 mg/dL (ref 8.4–10.5)
CHLORIDE: 106 meq/L (ref 96–112)
CO2: 24 meq/L (ref 19–32)
CREATININE: 0.9 mg/dL (ref 0.4–1.5)
GFR: 94.12 mL/min (ref >60.00)
GLUCOSE: 99 mg/dL (ref 70–99)
POTASSIUM: 3.9 meq/L (ref 3.5–5.1)
Sodium: 139 mEq/L (ref 135–145)
Total Bilirubin: 0.7 mg/dL (ref 0.2–1.2)
Total Protein: 6.8 g/dL (ref 6.0–8.3)

## 2013-12-25 LAB — LIPID PANEL
CHOLESTEROL: 143 mg/dL (ref 0–200)
HDL: 39.7 mg/dL (ref >39.00)
LDL Cholesterol: 85 mg/dL (ref 0–99)
NonHDL: 103.3
TRIGLYCERIDES: 90 mg/dL (ref 0.0–149.0)
Total CHOL/HDL Ratio: 4
VLDL: 18 mg/dL (ref 0.0–40.0)

## 2013-12-25 LAB — HEMOGLOBIN A1C: Hgb A1c MFr Bld: 6.1 % (ref 4.6–6.5)

## 2013-12-29 ENCOUNTER — Encounter: Payer: Self-pay | Admitting: *Deleted

## 2014-01-01 ENCOUNTER — Ambulatory Visit: Payer: 59 | Admitting: Internal Medicine

## 2014-01-14 ENCOUNTER — Encounter: Payer: Self-pay | Admitting: Internal Medicine

## 2014-01-14 ENCOUNTER — Ambulatory Visit (INDEPENDENT_AMBULATORY_CARE_PROVIDER_SITE_OTHER): Payer: 59 | Admitting: Internal Medicine

## 2014-01-14 VITALS — BP 122/72 | HR 93 | Temp 98.3°F | Ht 69.0 in | Wt 158.8 lb

## 2014-01-14 DIAGNOSIS — E785 Hyperlipidemia, unspecified: Secondary | ICD-10-CM

## 2014-01-14 DIAGNOSIS — E119 Type 2 diabetes mellitus without complications: Secondary | ICD-10-CM

## 2014-01-14 LAB — HM DIABETES FOOT EXAM: HM Diabetic Foot Exam: NORMAL

## 2014-01-14 MED ORDER — GLIPIZIDE 5 MG PO TABS: 5.0000 mg | ORAL_TABLET | Freq: Every day | ORAL | Status: DC | PRN

## 2014-01-14 NOTE — Assessment & Plan Note (Signed)
Lab Results  Component Value Date   LDLCALC 85 12/25/2013   Lipids well controlled without meds. Pt declines statin. Encouraged healthy diet and regular exercise.

## 2014-01-14 NOTE — Assessment & Plan Note (Addendum)
Lab Results  Component Value Date   HGBA1C 6.1 12/25/2013   BG well controlled on Metformin and Glipizide. Foot exam normal today. Not a candidate for ACE because of hypotension. Declines statin.Follow up in 6 months and prn.

## 2014-01-14 NOTE — Progress Notes (Signed)
   Subjective:    Patient ID: Jim [name], male    DOB: 10-Jan-1966, 48 y.o.   MRN: [id number]  HPI 48YO male presents for follow up.  DM - A1c 6.1%. Taking 1/2 Glipizide at night and metformin. Not recently checking BG.  Feeling well. No concerns today.  Review of Systems  Constitutional: Negative for fever, chills, activity change, appetite change, fatigue and unexpected weight change.  Eyes: Negative for visual disturbance.  Respiratory: Negative for cough and shortness of breath.   Cardiovascular: Negative for chest pain, palpitations and leg swelling.  Gastrointestinal: Negative for abdominal pain and abdominal distention.  Genitourinary: Negative for dysuria, urgency and difficulty urinating.  Musculoskeletal: Negative for arthralgias and gait problem.  Skin: Negative for color change and rash.  Hematological: Negative for adenopathy.  Psychiatric/Behavioral: Negative for sleep disturbance and dysphoric mood. The patient is not nervous/anxious.        Objective:    BP 122/72  Pulse 93  Temp(Src) 98.3 F (36.8 C) (Oral)  Ht 5\' 9"  (1.753 m)  Wt 158 lb 12 oz (72.009 kg)  BMI 23.43 kg/m2  SpO2 96% Physical Exam  Constitutional: He is oriented to person, place, and time. He appears well-developed and well-nourished. No distress.  HENT:  Head: Normocephalic and atraumatic.  Right Ear: External ear normal.  Left Ear: External ear normal.  Nose: Nose normal.  Mouth/Throat: Oropharynx is clear and moist. No oropharyngeal exudate.  Eyes: Conjunctivae and EOM are normal. Pupils are equal, round, and reactive to light. Right eye exhibits no discharge. Left eye exhibits no discharge. No scleral icterus.  Neck: Normal range of motion. Neck supple. No tracheal deviation present. No thyromegaly present.  Cardiovascular: Normal rate, regular rhythm and normal heart sounds.  Exam reveals no gallop and no friction rub.   No murmur heard. Pulmonary/Chest: Effort normal and breath  sounds normal. No accessory muscle usage. Not tachypneic. No respiratory distress. He has no decreased breath sounds. He has no wheezes. He has no rhonchi. He has no rales. He exhibits no tenderness.  Musculoskeletal: Normal range of motion. He exhibits no edema.  Lymphadenopathy:    He has no cervical adenopathy.  Neurological: He is alert and oriented to person, place, and time. No cranial nerve deficit. Coordination normal.  Skin: Skin is warm and dry. No rash noted. He is not diaphoretic. No erythema. No pallor.  Psychiatric: He has a normal mood and affect. His behavior is normal. Judgment and thought content normal.          Assessment & Plan:   Problem List Items Addressed This Visit     Unprioritized   Diabetes mellitus type 2, controlled - Primary      Lab Results  Component Value Date   HGBA1C 6.1 12/25/2013   BG well controlled on Metformin and Glipizide. Foot exam normal today. Not a candidate for ACE because of hypotension. Declines statin.Follow up in 6 months and prn.    Relevant Medications      glipiZIDE (GLUCOTROL) tablet   Other and unspecified hyperlipidemia      Lab Results  Component Value Date   LDLCALC 85 12/25/2013   Lipids well controlled without meds. Pt declines statin. Encouraged healthy diet and regular exercise.        Return in about 6 months (around 07/17/2014) for Recheck of Diabetes.

## 2014-01-14 NOTE — Progress Notes (Signed)
Pre visit review using our clinic review tool, if applicable. No additional management support is needed unless otherwise documented below in the visit note. 

## 2014-02-12 ENCOUNTER — Other Ambulatory Visit: Payer: Self-pay | Admitting: Internal Medicine

## 2014-03-13 ENCOUNTER — Other Ambulatory Visit: Payer: Self-pay | Admitting: *Deleted

## 2014-03-13 MED ORDER — TRUERESULT BLOOD GLUCOSE W/DEVICE KIT: PACK | Status: DC

## 2014-03-13 MED ORDER — BLOOD GLUCOSE TEST VI STRP: ORAL_STRIP | Status: DC

## 2014-03-13 NOTE — Progress Notes (Signed)
Sent Rxs per Dr. Gilford Rile

## 2014-03-13 NOTE — Telephone Encounter (Signed)
Pharmacy sent fax requesting clarification on Test strip Rx. Resent Rx to include to test 3 times daily

## 2014-03-17 ENCOUNTER — Other Ambulatory Visit: Payer: Self-pay | Admitting: *Deleted

## 2014-03-17 MED ORDER — BLOOD GLUCOSE TEST VI STRP: ORAL_STRIP | Status: DC

## 2014-03-17 NOTE — Telephone Encounter (Signed)
Pt requested 50 strips instead of 100, as they expire before he uses them all.

## 2014-04-03 ENCOUNTER — Other Ambulatory Visit: Payer: Self-pay | Admitting: Internal Medicine

## 2014-07-20 ENCOUNTER — Other Ambulatory Visit (INDEPENDENT_AMBULATORY_CARE_PROVIDER_SITE_OTHER): Payer: 59

## 2014-07-20 ENCOUNTER — Encounter: Payer: Self-pay | Admitting: *Deleted

## 2014-07-20 ENCOUNTER — Telehealth: Payer: Self-pay | Admitting: *Deleted

## 2014-07-20 DIAGNOSIS — E119 Type 2 diabetes mellitus without complications: Secondary | ICD-10-CM

## 2014-07-20 LAB — LIPID PANEL
CHOL/HDL RATIO: 5
CHOLESTEROL: 184 mg/dL (ref 0–200)
HDL: 34 mg/dL — AB (ref >39.00)
NonHDL: 150
TRIGLYCERIDES: 239 mg/dL — AB (ref 0.0–149.0)
VLDL: 47.8 mg/dL — ABNORMAL HIGH (ref 0.0–40.0)

## 2014-07-20 LAB — COMPREHENSIVE METABOLIC PANEL
ALBUMIN: 4.1 g/dL (ref 3.5–5.2)
ALT: 31 U/L (ref 0–53)
AST: 18 U/L (ref 0–37)
Alkaline Phosphatase: 70 U/L (ref 39–117)
BUN: 21 mg/dL (ref 6–23)
CHLORIDE: 104 meq/L (ref 96–112)
CO2: 27 mEq/L (ref 19–32)
Calcium: 9.3 mg/dL (ref 8.4–10.5)
Creatinine, Ser: 0.9 mg/dL (ref 0.4–1.5)
GFR: 97.54 mL/min (ref >60.00)
GLUCOSE: 143 mg/dL — AB (ref 70–99)
POTASSIUM: 4.4 meq/L (ref 3.5–5.1)
Sodium: 139 mEq/L (ref 135–145)
Total Bilirubin: 0.7 mg/dL (ref 0.2–1.2)
Total Protein: 6.5 g/dL (ref 6.0–8.3)

## 2014-07-20 LAB — HEMOGLOBIN A1C: HEMOGLOBIN A1C: 7.3 % — AB (ref 4.6–6.5)

## 2014-07-20 LAB — LDL CHOLESTEROL, DIRECT: LDL DIRECT: 96.8 mg/dL

## 2014-07-20 NOTE — Telephone Encounter (Signed)
What labs and dX?  

## 2014-07-20 NOTE — Telephone Encounter (Signed)
CMP, A1c, lipids

## 2014-07-20 NOTE — Telephone Encounter (Signed)
What dx?

## 2014-07-20 NOTE — Telephone Encounter (Signed)
diabetes

## 2014-07-22 ENCOUNTER — Encounter: Payer: Self-pay | Admitting: Internal Medicine

## 2014-07-22 ENCOUNTER — Ambulatory Visit (INDEPENDENT_AMBULATORY_CARE_PROVIDER_SITE_OTHER): Payer: 59 | Admitting: Internal Medicine

## 2014-07-22 VITALS — BP 110/67 | HR 90 | Temp 98.0°F | Ht 69.0 in | Wt 165.5 lb

## 2014-07-22 DIAGNOSIS — E785 Hyperlipidemia, unspecified: Secondary | ICD-10-CM

## 2014-07-22 DIAGNOSIS — E119 Type 2 diabetes mellitus without complications: Secondary | ICD-10-CM

## 2014-07-22 NOTE — Assessment & Plan Note (Signed)
Lab Results  Component Value Date   HGBA1C 7.3* 07/20/2014   Encouraged better compliance with healthy diet. Continue Metformin and Glipizide. Repeat A1c in 3 months.

## 2014-07-22 NOTE — Patient Instructions (Signed)
Labs in 3 months.

## 2014-07-22 NOTE — Progress Notes (Signed)
Subjective:    Patient ID: Jim [name], male    DOB: [birth year], 49 y.o.   MRN: [id number]  HPI 49YO male presents for follow up.  DM - Taking Metformin and 2.5mg  of Glipizide at night. No low BG noted. BG typically 130-170. None over 200.  Lab Results  Component Value Date   HGBA1C 7.3* 07/20/2014    Wt Readings from Last 3 Encounters:  07/22/14 165 lb 8 oz (75.07 kg)  01/14/14 158 lb 12 oz (72.009 kg)  09/18/13 159 lb (72.122 kg)    Past medical, surgical, family and social history per today's encounter.  Review of Systems  Constitutional: Negative for fever, chills, activity change, appetite change, fatigue and unexpected weight change.  Eyes: Negative for visual disturbance.  Respiratory: Negative for cough and shortness of breath.   Cardiovascular: Negative for chest pain, palpitations and leg swelling.  Gastrointestinal: Negative for nausea, vomiting, abdominal pain, diarrhea, constipation and abdominal distention.  Genitourinary: Negative for dysuria, urgency and difficulty urinating.  Musculoskeletal: Negative for arthralgias and gait problem.  Skin: Negative for color change and rash.  Hematological: Negative for adenopathy.  Psychiatric/Behavioral: Negative for sleep disturbance and dysphoric mood. The patient is not nervous/anxious.        Objective:    BP 110/67 mmHg  Pulse 90  Temp(Src) 98 F (36.7 C) (Oral)  Ht 5\' 9"  (1.753 m)  Wt 165 lb 8 oz (75.07 kg)  BMI 24.43 kg/m2  SpO2 98% Physical Exam  Constitutional: He is oriented to person, place, and time. He appears well-developed and well-nourished. No distress.  HENT:  Head: Normocephalic and atraumatic.  Right Ear: External ear normal.  Left Ear: External ear normal.  Nose: Nose normal.  Mouth/Throat: Oropharynx is clear and moist. No oropharyngeal exudate.  Eyes: Conjunctivae and EOM are normal. Pupils are equal, round, and reactive to light. Right eye exhibits no discharge. Left eye exhibits  no discharge. No scleral icterus.  Neck: Normal range of motion. Neck supple. No tracheal deviation present. No thyromegaly present.  Cardiovascular: Normal rate, regular rhythm and normal heart sounds.  Exam reveals no gallop and no friction rub.   No murmur heard. Pulmonary/Chest: Effort normal and breath sounds normal. No accessory muscle usage. No tachypnea. No respiratory distress. He has no decreased breath sounds. He has no wheezes. He has no rhonchi. He has no rales. He exhibits no tenderness.  Musculoskeletal: Normal range of motion. He exhibits no edema.  Lymphadenopathy:    He has no cervical adenopathy.  Neurological: He is alert and oriented to person, place, and time. No cranial nerve deficit. Coordination normal.  Skin: Skin is warm and dry. No rash noted. He is not diaphoretic. No erythema. No pallor.  Psychiatric: He has a normal mood and affect. His behavior is normal. Judgment and thought content normal.          Assessment & Plan:   Problem List Items Addressed This Visit      Unprioritized   Diabetes mellitus type 2, controlled - Primary    Lab Results  Component Value Date   HGBA1C 7.3* 07/20/2014   Encouraged better compliance with healthy diet. Continue Metformin and Glipizide. Repeat A1c in 3 months.    Hyperlipidemia    TG elevated on recent labs. He has been reluctant to start statin medication. Encouraged better compliance with diet. Follow up with repeat lipids in 3 months.        Return in about 3 months (around 10/21/2014) for  Recheck of Diabetes.

## 2014-07-22 NOTE — Progress Notes (Signed)
Pre visit review using our clinic review tool, if applicable. No additional management support is needed unless otherwise documented below in the visit note. 

## 2014-07-22 NOTE — Assessment & Plan Note (Signed)
TG elevated on recent labs. He has been reluctant to start statin medication. Encouraged better compliance with diet. Follow up with repeat lipids in 3 months.

## 2014-08-18 ENCOUNTER — Other Ambulatory Visit: Payer: Self-pay | Admitting: Internal Medicine

## 2014-08-18 NOTE — Telephone Encounter (Signed)
Last filled 04/24/13--last OV

## 2014-08-20 ENCOUNTER — Other Ambulatory Visit: Payer: Self-pay | Admitting: *Deleted

## 2014-08-20 MED ORDER — SILDENAFIL CITRATE 100 MG PO TABS: 100.0000 mg | ORAL_TABLET | Freq: Every day | ORAL | Status: DC | PRN

## 2014-10-15 ENCOUNTER — Other Ambulatory Visit: Payer: 59

## 2014-10-20 ENCOUNTER — Telehealth: Payer: Self-pay | Admitting: *Deleted

## 2014-10-20 ENCOUNTER — Other Ambulatory Visit (INDEPENDENT_AMBULATORY_CARE_PROVIDER_SITE_OTHER): Payer: 59

## 2014-10-20 ENCOUNTER — Other Ambulatory Visit: Payer: Self-pay | Admitting: *Deleted

## 2014-10-20 DIAGNOSIS — E119 Type 2 diabetes mellitus without complications: Secondary | ICD-10-CM | POA: Diagnosis not present

## 2014-10-20 LAB — COMPREHENSIVE METABOLIC PANEL
ALBUMIN: 4.1 g/dL (ref 3.5–5.2)
ALK PHOS: 78 U/L (ref 39–117)
ALT: 29 U/L (ref 0–53)
AST: 19 U/L (ref 0–37)
BUN: 18 mg/dL (ref 6–23)
CO2: 28 mEq/L (ref 19–32)
Calcium: 9.4 mg/dL (ref 8.4–10.5)
Chloride: 101 mEq/L (ref 96–112)
Creatinine, Ser: 1 mg/dL (ref 0.40–1.50)
GFR: 85.18 mL/min (ref >60.00)
GLUCOSE: 144 mg/dL — AB (ref 70–99)
POTASSIUM: 4.1 meq/L (ref 3.5–5.1)
Sodium: 135 mEq/L (ref 135–145)
Total Bilirubin: 0.5 mg/dL (ref 0.2–1.2)
Total Protein: 6.4 g/dL (ref 6.0–8.3)

## 2014-10-20 LAB — HEMOGLOBIN A1C: Hgb A1c MFr Bld: 7.4 % — ABNORMAL HIGH (ref 4.6–6.5)

## 2014-10-20 LAB — LIPID PANEL
Cholesterol: 193 mg/dL (ref 0–200)
HDL: 37.3 mg/dL — AB (ref >39.00)
NonHDL: 155.7
Total CHOL/HDL Ratio: 5
Triglycerides: 233 mg/dL — ABNORMAL HIGH (ref 0.0–149.0)
VLDL: 46.6 mg/dL — ABNORMAL HIGH (ref 0.0–40.0)

## 2014-10-20 LAB — LDL CHOLESTEROL, DIRECT: Direct LDL: 104 mg/dL

## 2014-10-20 NOTE — Telephone Encounter (Signed)
Labs and dx?  

## 2014-10-20 NOTE — Telephone Encounter (Signed)
CMP, A1c lipids for diabetes mellitus

## 2014-10-22 ENCOUNTER — Encounter: Payer: Self-pay | Admitting: Internal Medicine

## 2014-10-22 ENCOUNTER — Ambulatory Visit (INDEPENDENT_AMBULATORY_CARE_PROVIDER_SITE_OTHER): Payer: 59 | Admitting: Internal Medicine

## 2014-10-22 VITALS — BP 95/63 | HR 83 | Temp 98.3°F | Ht 69.0 in | Wt 164.2 lb

## 2014-10-22 DIAGNOSIS — E785 Hyperlipidemia, unspecified: Secondary | ICD-10-CM

## 2014-10-22 DIAGNOSIS — E119 Type 2 diabetes mellitus without complications: Secondary | ICD-10-CM | POA: Diagnosis not present

## 2014-10-22 MED ORDER — METFORMIN HCL ER 750 MG PO TB24: 750.0000 mg | ORAL_TABLET | Freq: Every day | ORAL | Status: DC

## 2014-10-22 NOTE — Assessment & Plan Note (Addendum)
Mild elevation of lipids. Pt has declined use of statin medication. Encouraged Mediterranean style diet and exercise.

## 2014-10-22 NOTE — Progress Notes (Signed)
Subjective:    Patient ID: Jim [name], male    DOB: Aug 21, 1965, 49 y.o.   MRN: [id number]  HPI 49YO male presents for follow up.  DM - Recent A1c 7.4%. Notes that pharmacy recently changed brand of metformin. Now having some upset stomach and diarrhea. Taking Glipizide 2.5mg  daily with dinner. Trying to follow healthy diet and exercise.   Wt Readings from Last 3 Encounters:  10/22/14 164 lb 4 oz (74.503 kg)  07/22/14 165 lb 8 oz (75.07 kg)  01/14/14 158 lb 12 oz (72.009 kg)     Past medical, surgical, family and social history per today's encounter.  Review of Systems  Constitutional: Negative for fever, chills, activity change, appetite change, fatigue and unexpected weight change.  Eyes: Negative for visual disturbance.  Respiratory: Negative for cough and shortness of breath.   Cardiovascular: Negative for chest pain, palpitations and leg swelling.  Gastrointestinal: Positive for abdominal pain and diarrhea. Negative for nausea, vomiting, constipation, blood in stool and abdominal distention.  Genitourinary: Negative for dysuria, urgency and difficulty urinating.  Musculoskeletal: Negative for arthralgias and gait problem.  Skin: Negative for color change and rash.  Hematological: Negative for adenopathy.  Psychiatric/Behavioral: Negative for sleep disturbance and dysphoric mood. The patient is not nervous/anxious.        Objective:    BP 95/63 mmHg  Pulse 83  Temp(Src) 98.3 F (36.8 C) (Oral)  Ht 5\' 9"  (1.753 m)  Wt 164 lb 4 oz (74.503 kg)  BMI 24.24 kg/m2  SpO2 96% Physical Exam  Constitutional: He is oriented to person, place, and time. He appears well-developed and well-nourished. No distress.  HENT:  Head: Normocephalic and atraumatic.  Right Ear: External ear normal.  Left Ear: External ear normal.  Nose: Nose normal.  Mouth/Throat: Oropharynx is clear and moist. No oropharyngeal exudate.  Eyes: Conjunctivae and EOM are normal. Pupils are equal,  round, and reactive to light. Right eye exhibits no discharge. Left eye exhibits no discharge. No scleral icterus.  Neck: Normal range of motion. Neck supple. No tracheal deviation present. No thyromegaly present.  Cardiovascular: Normal rate, regular rhythm and normal heart sounds.  Exam reveals no gallop and no friction rub.   No murmur heard. Pulmonary/Chest: Effort normal and breath sounds normal. No accessory muscle usage. No tachypnea. No respiratory distress. He has no decreased breath sounds. He has no wheezes. He has no rhonchi. He has no rales. He exhibits no tenderness.  Musculoskeletal: Normal range of motion. He exhibits no edema.  Lymphadenopathy:    He has no cervical adenopathy.  Neurological: He is alert and oriented to person, place, and time. No cranial nerve deficit. Coordination normal.  Skin: Skin is warm and dry. No rash noted. He is not diaphoretic. No erythema. No pallor.  Psychiatric: He has a normal mood and affect. His behavior is normal. Judgment and thought content normal.          Assessment & Plan:   Problem List Items Addressed This Visit      Unprioritized   Diabetes mellitus type 2, controlled - Primary    Having some diarrhea with metformin. Will try to change to long-acting form, starting at 750mg  daily. Increase Glipizide to 2.5mg  bid. Follow up in 3 months and prn. Note, unable to use ACEi because of hypotension.      Relevant Medications   metFORMIN (GLUCOPHAGE-XR) 24 hr tablet   Hyperlipidemia    Mild elevation of lipids. Pt has declined use of statin medication.  Encouraged Mediterranean style diet and exercise.          Return in about 3 months (around 01/21/2015) for Recheck of Diabetes.

## 2014-10-22 NOTE — Progress Notes (Signed)
Pre visit review using our clinic review tool, if applicable. No additional management support is needed unless otherwise documented below in the visit note. 

## 2014-10-22 NOTE — Assessment & Plan Note (Addendum)
Having some diarrhea with metformin. Will try to change to long-acting form, starting at 750mg  daily. Increase Glipizide to 2.5mg  bid. Follow up in 3 months and prn. Note, unable to use ACEi because of hypotension.

## 2014-10-22 NOTE — Patient Instructions (Addendum)
Stop current metformin.  Add Glucophage (Metformin extended release)750mg  daily.  Try increasing Glipizide to 2.5mg  twice daily.  Email with update. [email or web address]

## 2014-12-07 ENCOUNTER — Ambulatory Visit (INDEPENDENT_AMBULATORY_CARE_PROVIDER_SITE_OTHER): Payer: 59 | Admitting: Nurse Practitioner

## 2014-12-07 ENCOUNTER — Encounter: Payer: Self-pay | Admitting: Nurse Practitioner

## 2014-12-07 VITALS — BP 118/78 | HR 104 | Temp 98.8°F | Resp 18 | Ht 69.0 in | Wt 159.8 lb

## 2014-12-07 DIAGNOSIS — B9689 Other specified bacterial agents as the cause of diseases classified elsewhere: Secondary | ICD-10-CM | POA: Insufficient documentation

## 2014-12-07 DIAGNOSIS — J019 Acute sinusitis, unspecified: Secondary | ICD-10-CM | POA: Diagnosis not present

## 2014-12-07 MED ORDER — AMOXICILLIN-POT CLAVULANATE 875-125 MG PO TABS: 1.0000 | ORAL_TABLET | Freq: Two times a day (BID) | ORAL | Status: DC

## 2014-12-07 NOTE — Patient Instructions (Addendum)
Take the antibiotics as prescribed.   Follow up in 2 weeks if not feeling better.   You will not need extra antibiotics for the eyes- make sure to wash your hands thoroughly when touching your eyes.

## 2014-12-07 NOTE — Assessment & Plan Note (Signed)
Pt has hx of recurrent sinus infections. Had consult with ENT. Has not had problems in the last year or two. Today he has 3 weeks of symptoms. Will send 10 days of augmentin. Will follow up in 2 weeks if not helpful. Continue OTC management.

## 2014-12-07 NOTE — Progress Notes (Signed)
Pre visit review using our clinic review tool, if applicable. No additional management support is needed unless otherwise documented below in the visit note. 

## 2014-12-07 NOTE — Progress Notes (Signed)
   Subjective:    Patient ID: Jim [name], male    DOB: 01/21/1966, 48 y.o.   MRN: [id number]  HPI  [name] is a 49 yo male with a sinus issues x 3 weeks.   1) Productive cough, sinus pressure, right side swollen, hurts to touch. Eyes watering, crusty in the morning, bilaterally, denies tooth pain, bad taste in mouth from drainage.  All family members have had a form of this   30 days of antibiotics in the past for these symptoms (years prior)   Saw ENT and did not want to go through with surgery.   Ibuprofen and warm rice bag Theraflu Tylenol   Review of Systems  Constitutional: Positive for chills, diaphoresis and fatigue. Negative for fever.  HENT: Positive for congestion, sinus pressure and sore throat. Negative for postnasal drip, rhinorrhea, sneezing, tinnitus, trouble swallowing and voice change.   Eyes: Positive for discharge and redness. Negative for itching and visual disturbance.  Respiratory: Positive for cough. Negative for chest tightness, shortness of breath and wheezing.   Cardiovascular: Negative for chest pain, palpitations and leg swelling.  Gastrointestinal: Negative for nausea, vomiting and diarrhea.  Skin: Negative for rash.  Neurological: Negative for headaches.      Objective:   Physical Exam  Constitutional: He is oriented to person, place, and time. He appears well-developed and well-nourished. No distress.  BP 118/78 mmHg  Pulse 104  Temp(Src) 98.8 F (37.1 C)  Resp 18  Ht 5\' 9"  (1.753 m)  Wt 159 lb 12.8 oz (72.485 kg)  BMI 23.59 kg/m2  SpO2 94%   HENT:  Head: Normocephalic and atraumatic.  Right Ear: External ear normal.  Left Ear: External ear normal.  Mouth/Throat: Oropharynx is clear and moist.  Eyes: Conjunctivae and EOM are normal. Pupils are equal, round, and reactive to light. Right eye exhibits no discharge. Left eye exhibits no discharge. No scleral icterus.  Neck: Normal range of motion. Neck supple. No thyromegaly present.    Cardiovascular: Normal rate and regular rhythm.   Pulmonary/Chest: Effort normal and breath sounds normal. No respiratory distress. He has no wheezes. He has no rales. He exhibits no tenderness.  Lymphadenopathy:    He has no cervical adenopathy.  Neurological: He is alert and oriented to person, place, and time.  Skin: Skin is warm and dry. No rash noted. He is not diaphoretic.  Psychiatric: He has a normal mood and affect. His behavior is normal. Judgment and thought content normal.      Assessment & Plan:

## 2014-12-15 ENCOUNTER — Encounter: Payer: Self-pay | Admitting: *Deleted

## 2014-12-15 LAB — HM DIABETES EYE EXAM

## 2015-01-19 ENCOUNTER — Other Ambulatory Visit: Payer: Self-pay | Admitting: Internal Medicine

## 2015-01-29 ENCOUNTER — Other Ambulatory Visit (INDEPENDENT_AMBULATORY_CARE_PROVIDER_SITE_OTHER): Payer: 59

## 2015-01-29 ENCOUNTER — Telehealth: Payer: Self-pay | Admitting: *Deleted

## 2015-01-29 DIAGNOSIS — R7989 Other specified abnormal findings of blood chemistry: Secondary | ICD-10-CM

## 2015-01-29 DIAGNOSIS — E119 Type 2 diabetes mellitus without complications: Secondary | ICD-10-CM

## 2015-01-29 LAB — HEMOGLOBIN A1C: HEMOGLOBIN A1C: 9 % — AB (ref 4.6–6.5)

## 2015-01-29 LAB — COMPREHENSIVE METABOLIC PANEL
ALT: 25 U/L (ref 0–53)
AST: 16 U/L (ref 0–37)
Albumin: 4.1 g/dL (ref 3.5–5.2)
Alkaline Phosphatase: 89 U/L (ref 39–117)
BUN: 20 mg/dL (ref 6–23)
CHLORIDE: 103 meq/L (ref 96–112)
CO2: 27 mEq/L (ref 19–32)
Calcium: 9.3 mg/dL (ref 8.4–10.5)
Creatinine, Ser: 0.93 mg/dL (ref 0.40–1.50)
GFR: 92.51 mL/min (ref >60.00)
GLUCOSE: 198 mg/dL — AB (ref 70–99)
Potassium: 4.4 mEq/L (ref 3.5–5.1)
SODIUM: 137 meq/L (ref 135–145)
TOTAL PROTEIN: 6.4 g/dL (ref 6.0–8.3)
Total Bilirubin: 0.4 mg/dL (ref 0.2–1.2)

## 2015-01-29 LAB — LIPID PANEL
Cholesterol: 174 mg/dL (ref 0–200)
HDL: 32.2 mg/dL — ABNORMAL LOW (ref >39.00)
NONHDL: 141.8
TRIGLYCERIDES: 398 mg/dL — AB (ref 0.0–149.0)
Total CHOL/HDL Ratio: 5
VLDL: 79.6 mg/dL — ABNORMAL HIGH (ref 0.0–40.0)

## 2015-01-29 LAB — LDL CHOLESTEROL, DIRECT: Direct LDL: 86 mg/dL

## 2015-01-29 NOTE — Telephone Encounter (Signed)
CMP, A1c , lipids for diabetes

## 2015-01-29 NOTE — Telephone Encounter (Signed)
Labs and dx?  

## 2015-02-01 ENCOUNTER — Ambulatory Visit (INDEPENDENT_AMBULATORY_CARE_PROVIDER_SITE_OTHER): Payer: 59 | Admitting: Internal Medicine

## 2015-02-01 ENCOUNTER — Encounter (INDEPENDENT_AMBULATORY_CARE_PROVIDER_SITE_OTHER): Payer: Self-pay

## 2015-02-01 ENCOUNTER — Encounter: Payer: Self-pay | Admitting: Internal Medicine

## 2015-02-01 VITALS — BP 98/69 | HR 87 | Temp 98.3°F | Ht 69.0 in | Wt 158.1 lb

## 2015-02-01 DIAGNOSIS — E785 Hyperlipidemia, unspecified: Secondary | ICD-10-CM

## 2015-02-01 DIAGNOSIS — E119 Type 2 diabetes mellitus without complications: Secondary | ICD-10-CM

## 2015-02-01 LAB — HM DIABETES FOOT EXAM: HM Diabetic Foot Exam: NORMAL

## 2015-02-01 MED ORDER — GLIPIZIDE 5 MG PO TABS: 5.0000 mg | ORAL_TABLET | Freq: Two times a day (BID) | ORAL | Status: DC

## 2015-02-01 NOTE — Assessment & Plan Note (Signed)
Recent A1c much higher than previous values. Will increase Glipizide to 5mg  po bid. Email with BG readings in 1-2 weeks. Continue Metformin.

## 2015-02-01 NOTE — Assessment & Plan Note (Signed)
Lab Results  Component Value Date   LDLCALC 85 12/25/2013   Lipids well controlled. Pt declined statin medication. Encouraged healthy diet and exercise.

## 2015-02-01 NOTE — Patient Instructions (Signed)
Increase Glipizide to 5mg  twice daily.  Email with blood sugars in 1-2 weeks.

## 2015-02-01 NOTE — Progress Notes (Signed)
Subjective:    Patient ID: Jim [name], male    DOB: 1966/05/01, 49 y.o.   MRN: [id number]  HPI  49YO male presents for follow up.  DM - Notes some recent increased stress and recent URI. Questions if this may have elevated BG. BG running over 200 at times. Taking Glipizide 2.5mg  bid.   Lab Results  Component Value Date   HGBA1C 9.0* 01/29/2015   Wt Readings from Last 3 Encounters:  02/01/15 158 lb 2 oz (71.725 kg)  12/07/14 159 lb 12.8 oz (72.485 kg)  10/22/14 164 lb 4 oz (74.503 kg)     Past medical, surgical, family and social history per today's encounter.  Review of Systems  Constitutional: Negative for fever, chills, activity change, appetite change, fatigue and unexpected weight change.  Eyes: Negative for visual disturbance.  Respiratory: Negative for cough and shortness of breath.   Cardiovascular: Negative for chest pain, palpitations and leg swelling.  Gastrointestinal: Negative for nausea, vomiting, abdominal pain, diarrhea, constipation and abdominal distention.  Genitourinary: Negative for dysuria, urgency and difficulty urinating.  Musculoskeletal: Negative for myalgias, arthralgias and gait problem.  Skin: Negative for color change and rash.  Hematological: Negative for adenopathy.  Psychiatric/Behavioral: Negative for sleep disturbance and dysphoric mood. The patient is not nervous/anxious.        Objective:    BP 98/69 mmHg  Pulse 87  Temp(Src) 98.3 F (36.8 C) (Oral)  Ht 5\' 9"  (1.753 m)  Wt 158 lb 2 oz (71.725 kg)  BMI 23.34 kg/m2  SpO2 97% Physical Exam  Constitutional: He is oriented to person, place, and time. He appears well-developed and well-nourished. No distress.  HENT:  Head: Normocephalic and atraumatic.  Right Ear: External ear normal.  Left Ear: External ear normal.  Nose: Nose normal.  Mouth/Throat: Oropharynx is clear and moist. No oropharyngeal exudate.  Eyes: Conjunctivae and EOM are normal. Pupils are equal, round, and  reactive to light. Right eye exhibits no discharge. Left eye exhibits no discharge. No scleral icterus.  Neck: Normal range of motion. Neck supple. No tracheal deviation present. No thyromegaly present.  Cardiovascular: Normal rate, regular rhythm and normal heart sounds.  Exam reveals no gallop and no friction rub.   No murmur heard. Pulmonary/Chest: Effort normal and breath sounds normal. No accessory muscle usage. No tachypnea. No respiratory distress. He has no decreased breath sounds. He has no wheezes. He has no rhonchi. He has no rales. He exhibits no tenderness.  Musculoskeletal: Normal range of motion. He exhibits no edema.  Lymphadenopathy:    He has no cervical adenopathy.  Neurological: He is alert and oriented to person, place, and time. No cranial nerve deficit. Coordination normal.  Skin: Skin is warm and dry. No rash noted. He is not diaphoretic. No erythema. No pallor.  Psychiatric: He has a normal mood and affect. His behavior is normal. Judgment and thought content normal.          Assessment & Plan:   Problem List Items Addressed This Visit      Unprioritized   Diabetes mellitus type 2, controlled - Primary    Recent A1c much higher than previous values. Will increase Glipizide to 5mg  po bid. Email with BG readings in 1-2 weeks. Continue Metformin.      Relevant Medications   glipiZIDE (GLUCOTROL) 5 MG tablet   Hyperlipidemia    Lab Results  Component Value Date   LDLCALC 85 12/25/2013   Lipids well controlled. Pt declined statin medication. Encouraged  healthy diet and exercise.          Return in about 3 months (around 05/04/2015) for Recheck of Diabetes.

## 2015-02-01 NOTE — Progress Notes (Signed)
Pre visit review using our clinic review tool, if applicable. No additional management support is needed unless otherwise documented below in the visit note. 

## 2015-04-30 ENCOUNTER — Other Ambulatory Visit (INDEPENDENT_AMBULATORY_CARE_PROVIDER_SITE_OTHER): Payer: 59

## 2015-04-30 ENCOUNTER — Encounter (INDEPENDENT_AMBULATORY_CARE_PROVIDER_SITE_OTHER): Payer: Self-pay

## 2015-04-30 ENCOUNTER — Telehealth: Payer: Self-pay | Admitting: *Deleted

## 2015-04-30 DIAGNOSIS — E1121 Type 2 diabetes mellitus with diabetic nephropathy: Secondary | ICD-10-CM

## 2015-04-30 LAB — LIPID PANEL
Cholesterol: 186 mg/dL (ref 0–200)
HDL: 36.6 mg/dL — AB (ref >39.00)
NonHDL: 149.09
TRIGLYCERIDES: 251 mg/dL — AB (ref 0.0–149.0)
Total CHOL/HDL Ratio: 5
VLDL: 50.2 mg/dL — ABNORMAL HIGH (ref 0.0–40.0)

## 2015-04-30 LAB — COMPREHENSIVE METABOLIC PANEL
ALT: 27 U/L (ref 0–53)
AST: 16 U/L (ref 0–37)
Albumin: 4.2 g/dL (ref 3.5–5.2)
Alkaline Phosphatase: 81 U/L (ref 39–117)
BUN: 20 mg/dL (ref 6–23)
CHLORIDE: 101 meq/L (ref 96–112)
CO2: 27 mEq/L (ref 19–32)
Calcium: 9.5 mg/dL (ref 8.4–10.5)
Creatinine, Ser: 1.02 mg/dL (ref 0.40–1.50)
GFR: 83.07 mL/min (ref >60.00)
Glucose, Bld: 171 mg/dL — ABNORMAL HIGH (ref 70–99)
Potassium: 4.4 mEq/L (ref 3.5–5.1)
Sodium: 137 mEq/L (ref 135–145)
TOTAL PROTEIN: 6.6 g/dL (ref 6.0–8.3)
Total Bilirubin: 0.6 mg/dL (ref 0.2–1.2)

## 2015-04-30 LAB — LDL CHOLESTEROL, DIRECT: LDL DIRECT: 90 mg/dL

## 2015-04-30 LAB — HEMOGLOBIN A1C: HEMOGLOBIN A1C: 7.9 % — AB (ref 4.6–6.5)

## 2015-04-30 NOTE — Telephone Encounter (Signed)
Dx:

## 2015-04-30 NOTE — Telephone Encounter (Signed)
Labs and dx?  

## 2015-04-30 NOTE — Telephone Encounter (Signed)
CMP, A1c, Lipids

## 2015-04-30 NOTE — Telephone Encounter (Signed)
Diabetes type 2

## 2015-05-04 ENCOUNTER — Encounter: Payer: Self-pay | Admitting: Internal Medicine

## 2015-05-04 ENCOUNTER — Ambulatory Visit (INDEPENDENT_AMBULATORY_CARE_PROVIDER_SITE_OTHER): Payer: 59 | Admitting: Internal Medicine

## 2015-05-04 VITALS — BP 108/68 | HR 79 | Temp 98.0°F | Ht 69.0 in | Wt 160.4 lb

## 2015-05-04 DIAGNOSIS — E785 Hyperlipidemia, unspecified: Secondary | ICD-10-CM | POA: Diagnosis not present

## 2015-05-04 DIAGNOSIS — E119 Type 2 diabetes mellitus without complications: Secondary | ICD-10-CM

## 2015-05-04 NOTE — Progress Notes (Signed)
Subjective:    Patient ID: Jim [name], male    DOB: [birth year], 49 y.o.   MRN: [id number]  HPI  49YO male presents for follow up.  Feeling well. No concerns today. Compliant with medication. Working on low carb diet. Limiting soda and tea. Few low BG near 80s, during which he is asymptomatic with shakiness. Resolved with eating something.   Wt Readings from Last 3 Encounters:  05/04/15 160 lb 6 oz (72.746 kg)  02/01/15 158 lb 2 oz (71.725 kg)  12/07/14 159 lb 12.8 oz (72.485 kg)   BP Readings from Last 3 Encounters:  05/04/15 108/68  02/01/15 98/69  12/07/14 118/78    Past Medical History  Diagnosis Date  . Diabetes mellitus 2012  . GERD (gastroesophageal reflux disease)    Family History  Problem Relation Age of Onset  . Diabetes Brother   . Parkinsonism Maternal Grandfather   . Stroke Paternal Grandfather    Past Surgical History  Procedure Laterality Date  . Closed reduction ankle fracture     Social History   Social History  . Marital Status: Married    Spouse Name: N/A  . Number of Children: N/A  . Years of Education: N/A   Social History Main Topics  . Smoking status: Never Smoker   . Smokeless tobacco: Never Used  . Alcohol Use: Yes     Comment: ocassionally  . Drug Use: No  . Sexual Activity: Not Asked   Other Topics Concern  . None   Social History Narrative   Works for [employer]   . Lives in [place]. 2 children, daughter 15YO and son 11YO    Review of Systems  Constitutional: Negative for fever, chills, activity change, appetite change, fatigue and unexpected weight change.  Eyes: Negative for visual disturbance.  Respiratory: Negative for cough and shortness of breath.   Cardiovascular: Negative for chest pain, palpitations and leg swelling.  Gastrointestinal: Negative for nausea, vomiting, abdominal pain, diarrhea, constipation and abdominal distention.  Genitourinary: Negative for dysuria, urgency and difficulty  urinating.  Musculoskeletal: Negative for arthralgias and gait problem.  Skin: Negative for color change and rash.  Hematological: Negative for adenopathy.  Psychiatric/Behavioral: Negative for sleep disturbance and dysphoric mood. The patient is not nervous/anxious.        Objective:    BP 108/68 mmHg  Pulse 79  Temp(Src) 98 F (36.7 C) (Oral)  Ht 5\' 9"  (1.753 m)  Wt 160 lb 6 oz (72.746 kg)  BMI 23.67 kg/m2  SpO2 99% Physical Exam  Constitutional: He is oriented to person, place, and time. He appears well-developed and well-nourished. No distress.  HENT:  Head: Normocephalic and atraumatic.  Right Ear: External ear normal.  Left Ear: External ear normal.  Nose: Nose normal.  Mouth/Throat: Oropharynx is clear and moist. No oropharyngeal exudate.  Eyes: Conjunctivae and EOM are normal. Pupils are equal, round, and reactive to light. Right eye exhibits no discharge. Left eye exhibits no discharge. No scleral icterus.  Neck: Normal range of motion. Neck supple. No tracheal deviation present. No thyromegaly present.  Cardiovascular: Normal rate, regular rhythm and normal heart sounds.  Exam reveals no gallop and no friction rub.   No murmur heard. Pulmonary/Chest: Effort normal and breath sounds normal. No accessory muscle usage. No tachypnea. No respiratory distress. He has no decreased breath sounds. He has no wheezes. He has no rhonchi. He has no rales. He exhibits no tenderness.  Musculoskeletal: Normal range of motion. He exhibits no  edema.  Lymphadenopathy:    He has no cervical adenopathy.  Neurological: He is alert and oriented to person, place, and time. No cranial nerve deficit. Coordination normal.  Skin: Skin is warm and dry. No rash noted. He is not diaphoretic. No erythema. No pallor.  Psychiatric: He has a normal mood and affect. His behavior is normal. Judgment and thought content normal.          Assessment & Plan:   Problem List Items Addressed This Visit        Unprioritized   Diabetes mellitus type 2, controlled ([place]) - Primary    BG improved. Continue Metformin and Glipizide. Encouraged healthy diet and exercise. Follow up in 3 months.      Hyperlipidemia    Lipids improved. Declines statin medication. Encouraged continued healthy diet and exercise.          Return in about 3 months (around 08/04/2015) for Recheck of Diabetes.

## 2015-05-04 NOTE — Assessment & Plan Note (Signed)
Lipids improved. Declines statin medication. Encouraged continued healthy diet and exercise.

## 2015-05-04 NOTE — Progress Notes (Signed)
Pre visit review using our clinic review tool, if applicable. No additional management support is needed unless otherwise documented below in the visit note. 

## 2015-05-04 NOTE — Patient Instructions (Signed)
Follow up in 3 months

## 2015-05-04 NOTE — Assessment & Plan Note (Signed)
BG improved. Continue Metformin and Glipizide. Encouraged healthy diet and exercise. Follow up in 3 months.

## 2015-07-23 MED FILL — METFORMIN HCL ER 750 MG TAB: 750 | 90 days supply | Qty: 90 | Fill #1

## 2015-07-29 ENCOUNTER — Other Ambulatory Visit (INDEPENDENT_AMBULATORY_CARE_PROVIDER_SITE_OTHER): Payer: 59

## 2015-07-29 DIAGNOSIS — E119 Type 2 diabetes mellitus without complications: Secondary | ICD-10-CM

## 2015-07-29 LAB — COMPREHENSIVE METABOLIC PANEL
ALBUMIN: 4.3 g/dL (ref 3.5–5.2)
ALK PHOS: 81 U/L (ref 39–117)
ALT: 31 U/L (ref 0–53)
AST: 18 U/L (ref 0–37)
BILIRUBIN TOTAL: 0.5 mg/dL (ref 0.2–1.2)
BUN: 23 mg/dL (ref 6–23)
CO2: 26 mEq/L (ref 19–32)
CREATININE: 0.98 mg/dL (ref 0.40–1.50)
Calcium: 9.7 mg/dL (ref 8.4–10.5)
Chloride: 104 mEq/L (ref 96–112)
GFR: 86.9 mL/min (ref >60.00)
Glucose, Bld: 175 mg/dL — ABNORMAL HIGH (ref 70–99)
POTASSIUM: 4.5 meq/L (ref 3.5–5.1)
SODIUM: 138 meq/L (ref 135–145)
TOTAL PROTEIN: 6.5 g/dL (ref 6.0–8.3)

## 2015-07-29 LAB — HEMOGLOBIN A1C: HEMOGLOBIN A1C: 8.4 % — AB (ref 4.6–6.5)

## 2015-08-03 ENCOUNTER — Ambulatory Visit (INDEPENDENT_AMBULATORY_CARE_PROVIDER_SITE_OTHER): Payer: 59 | Admitting: Internal Medicine

## 2015-08-03 ENCOUNTER — Encounter: Payer: Self-pay | Admitting: Internal Medicine

## 2015-08-03 VITALS — BP 112/74 | HR 89 | Temp 98.7°F | Ht 69.0 in | Wt 161.0 lb

## 2015-08-03 DIAGNOSIS — E785 Hyperlipidemia, unspecified: Secondary | ICD-10-CM

## 2015-08-03 DIAGNOSIS — L989 Disorder of the skin and subcutaneous tissue, unspecified: Secondary | ICD-10-CM | POA: Diagnosis not present

## 2015-08-03 DIAGNOSIS — IMO0001 Reserved for inherently not codable concepts without codable children: Secondary | ICD-10-CM

## 2015-08-03 DIAGNOSIS — E1165 Type 2 diabetes mellitus with hyperglycemia: Secondary | ICD-10-CM

## 2015-08-03 MED ORDER — METFORMIN HCL ER 750 MG PO TB24: 750.0000 mg | ORAL_TABLET | Freq: Two times a day (BID) | ORAL | Status: DC

## 2015-08-03 MED ORDER — GLIPIZIDE 10 MG PO TABS: 10.0000 mg | ORAL_TABLET | Freq: Two times a day (BID) | ORAL | Status: DC

## 2015-08-03 NOTE — Progress Notes (Signed)
Subjective:    Patient ID: Jim [name], male    DOB: 12/05/1965, 50 y.o.   MRN: [id number]  HPI  50YO male presents for follow up.  DM - BG generally well controlled at home, mostly below 200. Compliant with medication. Notes some dietary indiscretion over Holidays.  Lab Results  Component Value Date   HGBA1C 8.4* 07/29/2015   Skin lesion - right cheek initially red, then improved. Now more brownish. Not painful.  Wt Readings from Last 3 Encounters:  08/03/15 161 lb (73.029 kg)  05/04/15 160 lb 6 oz (72.746 kg)  02/01/15 158 lb 2 oz (71.725 kg)   BP Readings from Last 3 Encounters:  08/03/15 112/74  05/04/15 108/68  02/01/15 98/69    Past Medical History  Diagnosis Date  . Diabetes mellitus 2012  . GERD (gastroesophageal reflux disease)    Family History  Problem Relation Age of Onset  . Diabetes Brother   . Parkinsonism Maternal Grandfather   . Stroke Paternal Grandfather    Past Surgical History  Procedure Laterality Date  . Closed reduction ankle fracture     Social History   Social History  . Marital Status: Married    Spouse Name: N/A  . Number of Children: N/A  . Years of Education: N/A   Social History Main Topics  . Smoking status: Never Smoker   . Smokeless tobacco: Never Used  . Alcohol Use: Yes     Comment: ocassionally  . Drug Use: No  . Sexual Activity: Not Asked   Other Topics Concern  . None   Social History Narrative   Works for [employer]   . Lives in [place]. 2 children, daughter 15YO and son 11YO    Review of Systems  Constitutional: Negative for fever, chills, activity change, appetite change, fatigue and unexpected weight change.  Eyes: Negative for visual disturbance.  Respiratory: Negative for cough and shortness of breath.   Cardiovascular: Negative for chest pain, palpitations and leg swelling.  Gastrointestinal: Negative for vomiting, abdominal pain, diarrhea, constipation and abdominal distention.    Genitourinary: Negative for dysuria, urgency and difficulty urinating.  Musculoskeletal: Negative for arthralgias and gait problem.  Skin: Positive for color change. Negative for rash.  Hematological: Negative for adenopathy.  Psychiatric/Behavioral: Negative for sleep disturbance and dysphoric mood. The patient is not nervous/anxious.        Objective:    BP 112/74 mmHg  Pulse 89  Temp(Src) 98.7 F (37.1 C) (Oral)  Ht 5\' 9"  (1.753 m)  Wt 161 lb (73.029 kg)  BMI 23.76 kg/m2  SpO2 97% Physical Exam  Constitutional: He is oriented to person, place, and time. He appears well-developed and well-nourished. No distress.  HENT:  Head: Normocephalic and atraumatic.  Right Ear: External ear normal.  Left Ear: External ear normal.  Nose: Nose normal.  Mouth/Throat: Oropharynx is clear and moist. No oropharyngeal exudate.  Eyes: Conjunctivae and EOM are normal. Pupils are equal, round, and reactive to light. Right eye exhibits no discharge. Left eye exhibits no discharge. No scleral icterus.  Neck: Normal range of motion. Neck supple. No tracheal deviation present. No thyromegaly present.  Cardiovascular: Normal rate, regular rhythm and normal heart sounds.  Exam reveals no gallop and no friction rub.   No murmur heard. Pulmonary/Chest: Effort normal and breath sounds normal. No accessory muscle usage. No tachypnea. No respiratory distress. He has no decreased breath sounds. He has no wheezes. He has no rhonchi. He has no rales. He exhibits  no tenderness.  Musculoskeletal: Normal range of motion. He exhibits no edema.  Lymphadenopathy:    He has no cervical adenopathy.  Neurological: He is alert and oriented to person, place, and time. No cranial nerve deficit. Coordination normal.  Skin: Skin is warm and dry. No rash noted. He is not diaphoretic. No erythema. No pallor.     Psychiatric: He has a normal mood and affect. His behavior is normal. Judgment and thought content normal.           Assessment & Plan:   Problem List Items Addressed This Visit      Unprioritized   Diabetes mellitus type 2, controlled ([place]) - Primary    Poor blood sugar control. Will increase Metformin to 750mg  bid. Increase Glipizide to 10mg  po bid. Limit sweets. Follow up in 3 months and prn.      Relevant Medications   metFORMIN (GLUCOPHAGE XR) 750 MG 24 hr tablet   glipiZIDE (GLUCOTROL) 10 MG tablet   Hyperlipidemia    Plan repeat lipids in 3 months.      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 Will set up dermatology evaluation.      Relevant Orders   Ambulatory referral to Dermatology       Return in about 3 months (around 11/01/2015) for Recheck of Diabetes.

## 2015-08-03 NOTE — Assessment & Plan Note (Signed)
Poor blood sugar control. Will increase Metformin to 750mg  bid. Increase Glipizide to 10mg  po bid. Limit sweets. Follow up in 3 months and prn.

## 2015-08-03 NOTE — Assessment & Plan Note (Signed)
Skin lesion right cheek concerning for early [place]. Will set up dermatology evaluation.

## 2015-08-03 NOTE — Assessment & Plan Note (Signed)
Plan repeat lipids in 3 months.

## 2015-08-03 NOTE — Progress Notes (Signed)
Pre visit review using our clinic review tool, if applicable. No additional management support is needed unless otherwise documented below in the visit note. 

## 2015-08-03 NOTE — Patient Instructions (Addendum)
Increase Metformin to 750mg  twice daily.  Increase Glipizide to 10mg  twice daily.  Call if any blood sugars <70.  Limit processed sugars.   We will set up evaluation with dermatology.

## 2015-08-31 DIAGNOSIS — L739 Follicular disorder, unspecified: Secondary | ICD-10-CM | POA: Diagnosis not present

## 2015-08-31 DIAGNOSIS — D1800 Hemangioma unspecified site: Secondary | ICD-10-CM | POA: Diagnosis not present

## 2015-08-31 DIAGNOSIS — L812 Freckles: Secondary | ICD-10-CM | POA: Diagnosis not present

## 2015-08-31 DIAGNOSIS — L918 Other hypertrophic disorders of the skin: Secondary | ICD-10-CM | POA: Diagnosis not present

## 2015-08-31 DIAGNOSIS — L821 Other seborrheic keratosis: Secondary | ICD-10-CM | POA: Diagnosis not present

## 2015-08-31 DIAGNOSIS — D485 Neoplasm of uncertain behavior of skin: Secondary | ICD-10-CM | POA: Diagnosis not present

## 2015-08-31 DIAGNOSIS — D229 Melanocytic nevi, unspecified: Secondary | ICD-10-CM | POA: Diagnosis not present

## 2015-08-31 DIAGNOSIS — D225 Melanocytic nevi of trunk: Secondary | ICD-10-CM | POA: Diagnosis not present

## 2015-09-15 ENCOUNTER — Other Ambulatory Visit: Payer: Self-pay | Admitting: Internal Medicine

## 2015-09-15 MED FILL — METFORMIN HCL ER 750 MG TAB: 750 | 90 days supply | Qty: 180 | Fill #0

## 2015-09-15 MED FILL — glipiZIDE 10 MG TABS: 10 | 90 days supply | Qty: 180 | Fill #0

## 2015-10-25 ENCOUNTER — Ambulatory Visit (INDEPENDENT_AMBULATORY_CARE_PROVIDER_SITE_OTHER): Payer: 59 | Admitting: Internal Medicine

## 2015-10-25 ENCOUNTER — Encounter: Payer: Self-pay | Admitting: Internal Medicine

## 2015-10-25 ENCOUNTER — Telehealth: Payer: Self-pay | Admitting: Internal Medicine

## 2015-10-25 VITALS — BP 107/71 | HR 94 | Temp 98.5°F | Ht 69.0 in | Wt 162.2 lb

## 2015-10-25 DIAGNOSIS — E1165 Type 2 diabetes mellitus with hyperglycemia: Secondary | ICD-10-CM

## 2015-10-25 DIAGNOSIS — E785 Hyperlipidemia, unspecified: Secondary | ICD-10-CM

## 2015-10-25 DIAGNOSIS — E119 Type 2 diabetes mellitus without complications: Secondary | ICD-10-CM

## 2015-10-25 DIAGNOSIS — IMO0001 Reserved for inherently not codable concepts without codable children: Secondary | ICD-10-CM

## 2015-10-25 DIAGNOSIS — N182 Chronic kidney disease, stage 2 (mild): Principal | ICD-10-CM

## 2015-10-25 DIAGNOSIS — IMO0002 Reserved for concepts with insufficient information to code with codable children: Secondary | ICD-10-CM

## 2015-10-25 DIAGNOSIS — Z7189 Other specified counseling: Secondary | ICD-10-CM

## 2015-10-25 DIAGNOSIS — E1122 Type 2 diabetes mellitus with diabetic chronic kidney disease: Secondary | ICD-10-CM

## 2015-10-25 DIAGNOSIS — Z7184 Encounter for health counseling related to travel: Secondary | ICD-10-CM

## 2015-10-25 DIAGNOSIS — Z0000 Encounter for general adult medical examination without abnormal findings: Secondary | ICD-10-CM | POA: Insufficient documentation

## 2015-10-25 LAB — LIPID PANEL
CHOL/HDL RATIO: 4
CHOLESTEROL: 176 mg/dL (ref 0–200)
HDL: 41.5 mg/dL (ref >39.00)
LDL CALC: 99 mg/dL (ref 0–99)
NONHDL: 134.02
TRIGLYCERIDES: 176 mg/dL — AB (ref 0.0–149.0)
VLDL: 35.2 mg/dL (ref 0.0–40.0)

## 2015-10-25 LAB — COMPREHENSIVE METABOLIC PANEL
ALBUMIN: 4.4 g/dL (ref 3.5–5.2)
ALT: 35 U/L (ref 0–53)
AST: 19 U/L (ref 0–37)
Alkaline Phosphatase: 71 U/L (ref 39–117)
BILIRUBIN TOTAL: 0.5 mg/dL (ref 0.2–1.2)
BUN: 20 mg/dL (ref 6–23)
CALCIUM: 10 mg/dL (ref 8.4–10.5)
CHLORIDE: 103 meq/L (ref 96–112)
CO2: 23 meq/L (ref 19–32)
CREATININE: 0.94 mg/dL (ref 0.40–1.50)
GFR: 91.09 mL/min (ref >60.00)
Glucose, Bld: 147 mg/dL — ABNORMAL HIGH (ref 70–99)
Potassium: 4.2 mEq/L (ref 3.5–5.1)
SODIUM: 138 meq/L (ref 135–145)
Total Protein: 6.8 g/dL (ref 6.0–8.3)

## 2015-10-25 LAB — MICROALBUMIN / CREATININE URINE RATIO
Creatinine,U: 147.1 mg/dL
MICROALB UR: 0.9 mg/dL (ref 0.0–1.9)
Microalb Creat Ratio: 0.6 mg/g (ref 0.0–30.0)

## 2015-10-25 LAB — HEMOGLOBIN A1C: Hgb A1c MFr Bld: 7.1 % — ABNORMAL HIGH (ref 4.6–6.5)

## 2015-10-25 MED ORDER — ONDANSETRON HCL 8 MG PO TABS: 8.0000 mg | ORAL_TABLET | Freq: Three times a day (TID) | ORAL | Status: DC | PRN

## 2015-10-25 MED ORDER — AZITHROMYCIN 250 MG PO TABS: ORAL_TABLET | ORAL | Status: DC

## 2015-10-25 MED FILL — AZITHROMYCIN 250 MG TABLET: 250 | 5 days supply | Qty: 6 | Fill #0

## 2015-10-25 MED FILL — ONDANSETRON HCL 8 MG TABLET: 8 | 7 days supply | Qty: 20 | Fill #0

## 2015-10-25 NOTE — Assessment & Plan Note (Signed)
He is travelling to [place] this summer. Discussed precautions for traveler's diarrhea, however risk would be low in this location. Azithromycin 1gm can be taken for Traveler's diarrhea or Z-pack dosing used for URI. Also given Rx for Ondansetron prn.

## 2015-10-25 NOTE — Addendum Note (Signed)
Addended by: Nanci Pina on: 10/25/2015 01:35 PM   Modules accepted: Orders

## 2015-10-25 NOTE — Assessment & Plan Note (Signed)
Will check lipids with labs. 

## 2015-10-25 NOTE — Telephone Encounter (Signed)
Yes, it would be great if he can have a CMP, A1c prior to his follow up

## 2015-10-25 NOTE — Assessment & Plan Note (Signed)
Will check A1c with labs. Continue Metformin and Glipizide. 

## 2015-10-25 NOTE — Patient Instructions (Signed)
Labs today.  Follow up in 3 months and sooner as needed. 

## 2015-10-25 NOTE — Telephone Encounter (Signed)
Pt would like to get labs done before follow up appt. Need orders please and thank you!

## 2015-10-25 NOTE — Progress Notes (Signed)
Pre visit review using our clinic review tool, if applicable. No additional management support is needed unless otherwise documented below in the visit note. 

## 2015-10-25 NOTE — Progress Notes (Signed)
Subjective:    Patient ID: Jim [name], male    DOB: Nov 22, 1965, 50 y.o.   MRN: [id number]  HPI  50YO male presents for follow up.  DM - BG running near 100s. Few lows near 70, symptomatic and ate food with resolution of symptoms. Compliant with medication.  Planning to travel to [place] this summer.    Wt Readings from Last 3 Encounters:  10/25/15 162 lb 4 oz (73.596 kg)  08/03/15 161 lb (73.029 kg)  05/04/15 160 lb 6 oz (72.746 kg)   BP Readings from Last 3 Encounters:  10/25/15 107/71  08/03/15 112/74  05/04/15 108/68    Past Medical History  Diagnosis Date  . Diabetes mellitus 2012  . GERD (gastroesophageal reflux disease)    Family History  Problem Relation Age of Onset  . Diabetes Brother   . Parkinsonism Maternal Grandfather   . Stroke Paternal Grandfather    Past Surgical History  Procedure Laterality Date  . Closed reduction ankle fracture     Social History   Social History  . Marital Status: Married    Spouse Name: N/A  . Number of Children: N/A  . Years of Education: N/A   Social History Main Topics  . Smoking status: Never Smoker   . Smokeless tobacco: Never Used  . Alcohol Use: Yes     Comment: ocassionally  . Drug Use: No  . Sexual Activity: Not Asked   Other Topics Concern  . None   Social History Narrative   Works for [employer]   . Lives in [place]. 2 children, daughter 15YO and son 11YO    Review of Systems  Constitutional: Negative for fever, chills, activity change, appetite change, fatigue and unexpected weight change.  Eyes: Negative for visual disturbance.  Respiratory: Negative for cough and shortness of breath.   Cardiovascular: Negative for chest pain, palpitations and leg swelling.  Gastrointestinal: Negative for nausea, vomiting, abdominal pain, diarrhea, constipation, blood in stool and abdominal distention.  Genitourinary: Negative for dysuria, urgency and difficulty urinating.  Musculoskeletal:  Negative for arthralgias and gait problem.  Skin: Negative for color change and rash.  Hematological: Negative for adenopathy.  Psychiatric/Behavioral: Negative for suicidal ideas, sleep disturbance and dysphoric mood. The patient is not nervous/anxious.        Objective:    BP 107/71 mmHg  Pulse 94  Temp(Src) 98.5 F (36.9 C) (Oral)  Ht 5\' 9"  (1.753 m)  Wt 162 lb 4 oz (73.596 kg)  BMI 23.95 kg/m2  SpO2 97% Physical Exam  Constitutional: He is oriented to person, place, and time. He appears well-developed and well-nourished. No distress.  HENT:  Head: Normocephalic and atraumatic.  Right Ear: External ear normal.  Left Ear: External ear normal.  Nose: Nose normal.  Mouth/Throat: Oropharynx is clear and moist. No oropharyngeal exudate.  Eyes: Conjunctivae and EOM are normal. Pupils are equal, round, and reactive to light. Right eye exhibits no discharge. Left eye exhibits no discharge. No scleral icterus.  Neck: Normal range of motion. Neck supple. No tracheal deviation present. No thyromegaly present.  Cardiovascular: Normal rate, regular rhythm and normal heart sounds.  Exam reveals no gallop and no friction rub.   No murmur heard. Pulmonary/Chest: Effort normal and breath sounds normal. No accessory muscle usage. No tachypnea. No respiratory distress. He has no decreased breath sounds. He has no wheezes. He has no rhonchi. He has no rales. He exhibits no tenderness.  Musculoskeletal: Normal range of motion. He exhibits no  edema.  Lymphadenopathy:    He has no cervical adenopathy.  Neurological: He is alert and oriented to person, place, and time. No cranial nerve deficit. Coordination normal.  Skin: Skin is warm and dry. No rash noted. He is not diaphoretic. No erythema. No pallor.  Psychiatric: He has a normal mood and affect. His behavior is normal. Judgment and thought content normal.          Assessment & Plan:   Problem List Items Addressed This Visit       Unprioritized   Diabetes mellitus type 2, controlled ([place]) - Primary    Will check A1c with labs. Continue Metformin and Glipizide.      Hyperlipidemia    Will check lipids with labs.      Travel advice encounter    He is travelling to [place] this summer. Discussed precautions for traveler's diarrhea, however risk would be low in this location. Azithromycin 1gm can be taken for Traveler's diarrhea or Z-pack dosing used for URI. Also given Rx for Ondansetron prn.          Return in about 3 months (around 01/24/2016) for Recheck.  Ronette Deter, MD Internal Medicine [facility]

## 2015-10-25 NOTE — Telephone Encounter (Signed)
Orders in, he can be scheduled. thanks

## 2015-10-25 NOTE — Telephone Encounter (Signed)
Patient has f/u appt scheduled for July, please advise, does he needs labs prior to that appt as he had them done today.  Thanks.  If so please order and I will schedule.

## 2015-10-26 NOTE — Telephone Encounter (Signed)
Pt is scheduled for his labs.

## 2015-11-02 ENCOUNTER — Other Ambulatory Visit: Payer: 59

## 2015-11-08 ENCOUNTER — Ambulatory Visit: Payer: 59 | Admitting: Internal Medicine

## 2016-01-04 MED FILL — METFORMIN HCL ER 750 MG TAB: 750 | 90 days supply | Qty: 180 | Fill #1

## 2016-01-20 ENCOUNTER — Other Ambulatory Visit (INDEPENDENT_AMBULATORY_CARE_PROVIDER_SITE_OTHER): Payer: 59

## 2016-01-20 DIAGNOSIS — E1165 Type 2 diabetes mellitus with hyperglycemia: Secondary | ICD-10-CM | POA: Diagnosis not present

## 2016-01-20 DIAGNOSIS — E1122 Type 2 diabetes mellitus with diabetic chronic kidney disease: Secondary | ICD-10-CM | POA: Diagnosis not present

## 2016-01-20 DIAGNOSIS — N182 Chronic kidney disease, stage 2 (mild): Secondary | ICD-10-CM

## 2016-01-20 DIAGNOSIS — IMO0002 Reserved for concepts with insufficient information to code with codable children: Secondary | ICD-10-CM

## 2016-01-20 LAB — COMPREHENSIVE METABOLIC PANEL
ALT: 37 U/L (ref 0–53)
AST: 18 U/L (ref 0–37)
Albumin: 4.3 g/dL (ref 3.5–5.2)
Alkaline Phosphatase: 78 U/L (ref 39–117)
BILIRUBIN TOTAL: 0.4 mg/dL (ref 0.2–1.2)
BUN: 25 mg/dL — AB (ref 6–23)
CO2: 24 meq/L (ref 19–32)
Calcium: 9.4 mg/dL (ref 8.4–10.5)
Chloride: 102 mEq/L (ref 96–112)
Creatinine, Ser: 0.99 mg/dL (ref 0.40–1.50)
GFR: 85.71 mL/min (ref >60.00)
GLUCOSE: 217 mg/dL — AB (ref 70–99)
POTASSIUM: 4 meq/L (ref 3.5–5.1)
SODIUM: 137 meq/L (ref 135–145)
Total Protein: 6.7 g/dL (ref 6.0–8.3)

## 2016-01-20 LAB — HEMOGLOBIN A1C: HEMOGLOBIN A1C: 8 % — AB (ref 4.6–6.5)

## 2016-01-26 ENCOUNTER — Ambulatory Visit (INDEPENDENT_AMBULATORY_CARE_PROVIDER_SITE_OTHER): Payer: 59 | Admitting: Internal Medicine

## 2016-01-26 VITALS — BP 126/70 | HR 104 | Ht 69.0 in | Wt 158.0 lb

## 2016-01-26 DIAGNOSIS — E119 Type 2 diabetes mellitus without complications: Secondary | ICD-10-CM | POA: Diagnosis not present

## 2016-01-26 MED ORDER — SILDENAFIL CITRATE 100 MG PO TABS: 100.0000 mg | ORAL_TABLET | Freq: Every day | ORAL | Status: DC | PRN

## 2016-01-26 NOTE — Patient Instructions (Signed)
Continue current medications. 

## 2016-01-26 NOTE — Assessment & Plan Note (Signed)
Lab Results  Component Value Date   HGBA1C 8.0* 01/20/2016   BG continue to be elevated, however he has not been taking Glipizide regularly. Encouraged compliance with medication. Discussed potentially adding medication if BG continue to be elevated.

## 2016-01-26 NOTE — Progress Notes (Signed)
Subjective:    Patient ID: Jim [name], male    DOB: 01/30/1966, 50 y.o.   MRN: [id number]  HPI  50YO male presents for follow up.  DM -Compliant with Glipizide, except has missed some doses this month. Taking Metformin daily. BG near 150 at home or less. Some readings close to 200. Few times below 100. Occasional urinary frequency noted when busy at work. Not waking at night to urinate. This is sporadic and comes and goes. No clear correlation with blood sugars. Occasional increased thirst.  Lab Results  Component Value Date   HGBA1C 8.0* 01/20/2016     Wt Readings from Last 3 Encounters:  01/26/16 158 lb (71.668 kg)  10/25/15 162 lb 4 oz (73.596 kg)  08/03/15 161 lb (73.029 kg)   BP Readings from Last 3 Encounters:  01/26/16 126/70  10/25/15 107/71  08/03/15 112/74    Past Medical History  Diagnosis Date  . Diabetes mellitus 2012  . GERD (gastroesophageal reflux disease)    Family History  Problem Relation Age of Onset  . Diabetes Brother   . Parkinsonism Maternal Grandfather   . Stroke Paternal Grandfather    Past Surgical History  Procedure Laterality Date  . Closed reduction ankle fracture     Social History   Social History  . Marital Status: Married    Spouse Name: N/A  . Number of Children: N/A  . Years of Education: N/A   Social History Main Topics  . Smoking status: Never Smoker   . Smokeless tobacco: Never Used  . Alcohol Use: Yes     Comment: ocassionally  . Drug Use: No  . Sexual Activity: Not on file   Other Topics Concern  . Not on file   Social History Narrative   Works for [employer]   . Lives in [place]. 2 children, daughter 15YO and son 11YO    Review of Systems  Constitutional: Negative for fever, chills, activity change, appetite change, fatigue and unexpected weight change.  Eyes: Negative for visual disturbance.  Respiratory: Negative for cough and shortness of breath.   Cardiovascular: Negative for chest  pain, palpitations and leg swelling.  Gastrointestinal: Negative for nausea, vomiting, abdominal pain, diarrhea, constipation and abdominal distention.  Genitourinary: Positive for frequency. Negative for dysuria, urgency, flank pain and difficulty urinating.  Musculoskeletal: Negative for arthralgias and gait problem.  Skin: Negative for color change and rash.  Hematological: Negative for adenopathy.  Psychiatric/Behavioral: Negative for suicidal ideas, sleep disturbance and dysphoric mood. The patient is not nervous/anxious.        Objective:    BP 126/70 mmHg  Pulse 104  Ht 5\' 9"  (1.753 m)  Wt 158 lb (71.668 kg)  BMI 23.32 kg/m2  SpO2 98% Physical Exam  Constitutional: He is oriented to person, place, and time. He appears well-developed and well-nourished. No distress.  HENT:  Head: Normocephalic and atraumatic.  Right Ear: External ear normal.  Left Ear: External ear normal.  Nose: Nose normal.  Mouth/Throat: Oropharynx is clear and moist. No oropharyngeal exudate.  Eyes: Conjunctivae and EOM are normal. Pupils are equal, round, and reactive to light. Right eye exhibits no discharge. Left eye exhibits no discharge. No scleral icterus.  Neck: Normal range of motion. Neck supple. No tracheal deviation present. No thyromegaly present.  Cardiovascular: Normal rate, regular rhythm and normal heart sounds.  Exam reveals no gallop and no friction rub.   No murmur heard. Pulmonary/Chest: Effort normal and breath sounds normal. No accessory  muscle usage. No tachypnea. No respiratory distress. He has no decreased breath sounds. He has no wheezes. He has no rhonchi. He has no rales. He exhibits no tenderness.  Musculoskeletal: Normal range of motion. He exhibits no edema.  Lymphadenopathy:    He has no cervical adenopathy.  Neurological: He is alert and oriented to person, place, and time. No cranial nerve deficit. Coordination normal.  Skin: Skin is warm and dry. No rash noted. He is not  diaphoretic. No erythema. No pallor.  Psychiatric: He has a normal mood and affect. His behavior is normal. Judgment and thought content normal.          Assessment & Plan:   Problem List Items Addressed This Visit      Unprioritized   Diabetes mellitus type 2, controlled ([place]) - Primary    Lab Results  Component Value Date   HGBA1C 8.0* 01/20/2016   BG continue to be elevated, however he has not been taking Glipizide regularly. Encouraged compliance with medication. Discussed potentially adding medication if BG continue to be elevated.      Relevant Orders   Comprehensive metabolic panel   Hemoglobin A1c   Lipid panel   Microalbumin / creatinine urine ratio       Return in about 3 months (around 04/27/2016) for New Patient.  Ronette Deter, MD [facility]

## 2016-01-26 NOTE — Progress Notes (Signed)
Pre visit review using our clinic review tool, if applicable. No additional management support is needed unless otherwise documented below in the visit note. 

## 2016-02-25 MED FILL — glipiZIDE 10 MG TABS: 10 | 90 days supply | Qty: 180 | Fill #1

## 2016-04-12 ENCOUNTER — Ambulatory Visit: Payer: 59 | Admitting: Family

## 2016-04-12 NOTE — Progress Notes (Signed)
[name] [address], [place] [address] Phone: [phone] Subjective:    I'm seeing this patient by the request  of:  Rica Mast, MD   CC: Low back pain  [id number]  Jim [name] is a 50 y.o. male coming in with complaint of low back pain. Patient past medical history significant for diabetes. Patient states Been going on for multiple weeks. Seems to be worsening. Traveling on the right leg. Associated with some mild weakness. Going on the posterior aspect the leg elevated to his foot. Patient states that it's uncomfortable with activity or without activity. Waking him up at night. Rates the severity of pain a 7 out of 10. Over-the-counter medications do not seem to be beneficial.    Past Medical History:  Diagnosis Date  . Diabetes mellitus 2012  . GERD (gastroesophageal reflux disease)    Past Surgical History:  Procedure Laterality Date  . CLOSED REDUCTION ANKLE FRACTURE     Social History   Social History  . Marital status: Married    Spouse name: N/A  . Number of children: N/A  . Years of education: N/A   Social History Main Topics  . Smoking status: Never Smoker  . Smokeless tobacco: Never Used  . Alcohol use Yes     Comment: ocassionally  . Drug use: No  . Sexual activity: Not Asked   Other Topics Concern  . None   Social History Narrative   Works for [employer]   . Lives in [place]. 2 children, daughter 15YO and son 11YO   Allergies  Allergen Reactions  . Ciprofloxacin Rash   Family History  Problem Relation Age of Onset  . Diabetes Brother   . Parkinsonism Maternal Grandfather   . Stroke Paternal Grandfather     Past medical history, social, surgical and family history all reviewed in electronic medical record.  No pertanent information unless stated regarding to the chief complaint.   Review of Systems: No headache, visual changes, nausea, vomiting, diarrhea, constipation,  dizziness, abdominal pain, skin rash, fevers, chills, night sweats, weight loss, swollen lymph nodes, body aches, joint swelling, muscle aches, chest pain, shortness of breath, mood changes.   Objective  Blood pressure 122/80, pulse 88, weight 155 lb (70.3 kg), SpO2 97 %.  General: No apparent distress alert and oriented x3 mood and affect normal, dressed appropriately.  HEENT: Pupils equal, extraocular movements intact  Respiratory: Patient's speak in full sentences and does not appear short of breath  Cardiovascular: No lower extremity edema, non tender, no erythema  Skin: Warm dry intact with no signs of infection or rash on extremities or on axial skeleton.  Abdomen: Soft nontender  Neuro: Cranial nerves II through XII are intact, neurovascularly intact in all extremities with 2+ DTRs and 2+ pulses.  Lymph: No lymphadenopathy of posterior or anterior cervical chain or axillae bilaterally.  Gait normal with good balance and coordination.  MSK:  Non tender with full range of motion and good stability and symmetric strength and tone of shoulders, elbows, wrist, hip, knee and ankles bilaterally.  Back Exam:  Inspection: Unremarkable  Motion: Flexion 35 deg, Extension 25 deg, Side Bending to 35 deg bilaterally,  Rotation to 35 deg bilaterally  SLR laying: Positive at 20 XSLR laying: Negative  Palpable tenderness: Tender to palpation in the right paraspinal musculature. FABER: negative. Sensory change: Gross sensation intact to all lumbar and sacral dermatomes.  Reflexes: 2+ at both patellar tendons, 2+ at  achilles tendons, Babinski's downgoing.  Strength at foot  Plantar-flexion: 5/5 Dorsi-flexion: 5/5 Eversion: 5/5 Inversion: 5/5  Leg strength  Quad: 5/5 Hamstring: 5/5 Hip flexor: 5/5 Hip abductors: 5/5   Gait unremarkable.    Impression and Recommendations:     This case required medical decision making of moderate complexity.      Note: This dictation was prepared with  Dragon dictation along with smaller phrase technology. Any transcriptional errors that result from this process are unintentional.

## 2016-04-13 ENCOUNTER — Encounter: Payer: Self-pay | Admitting: Family Medicine

## 2016-04-13 ENCOUNTER — Ambulatory Visit (INDEPENDENT_AMBULATORY_CARE_PROVIDER_SITE_OTHER): Payer: 59 | Admitting: Family Medicine

## 2016-04-13 ENCOUNTER — Ambulatory Visit (INDEPENDENT_AMBULATORY_CARE_PROVIDER_SITE_OTHER)
Admission: RE | Admit: 2016-04-13 | Discharge: 2016-04-13 | Disposition: A | Discharge status: Home / Self Care | Payer: 59 | Source: Ambulatory Visit | Attending: Family Medicine | Admitting: Family Medicine

## 2016-04-13 VITALS — BP 122/80 | HR 88 | Wt 155.0 lb

## 2016-04-13 DIAGNOSIS — M545 Low back pain, unspecified: Secondary | ICD-10-CM

## 2016-04-13 DIAGNOSIS — M5416 Radiculopathy, lumbar region: Secondary | ICD-10-CM

## 2016-04-13 MED ORDER — PREDNISONE 50 MG PO TABS: 50.0000 mg | ORAL_TABLET | Freq: Every day | ORAL | 0 refills | Status: DC

## 2016-04-13 MED ORDER — GABAPENTIN 300 MG PO CAPS: ORAL_CAPSULE | ORAL | 3 refills | Status: DC

## 2016-04-13 MED ORDER — TRAMADOL HCL 50 MG PO TABS: 50.0000 mg | ORAL_TABLET | Freq: Three times a day (TID) | ORAL | 0 refills | Status: DC | PRN

## 2016-04-13 MED FILL — GABAPENTIN 300 MG CAPSULE: 300 | 30 days supply | Qty: 30 | Fill #0

## 2016-04-13 MED FILL — traMADol HCL 50 MG TABS: 50 | 10 days supply | Qty: 30 | Fill #0

## 2016-04-13 MED FILL — predniSONE 50 MG TABS: 50 | 5 days supply | Qty: 5 | Fill #0

## 2016-04-13 MED FILL — METFORMIN HCL ER 750 MG TAB: 750 | 90 days supply | Qty: 180 | Fill #2

## 2016-04-13 NOTE — Patient Instructions (Signed)
Good to see you.  Ice 20 minutes 2 times daily. Usually after activity and before bed. Prednisone daily for 5 days.  Gabapentin 300mg  at night Tramadol up to 3 times a day for pain  Xray downstairs OK to double book in 1-2 weeks.

## 2016-04-13 NOTE — Assessment & Plan Note (Signed)
Patient does have more of a lumbar radiculopathy. Positive straight leg test. Severe tenderness in the paraspinal musculature. Patient given prednisone and will monitor blood sugars. We discussed icing regimen, x-rays ordered today. Patient given gabapentin at night and tramadol for breaks or pain. Follow-up again in 1-2 weeks for further evaluation. At that time if improving we will start with home exercises and potentially formal physical therapy. If worsening or no improvement patient would likely need an MRI.

## 2016-04-17 ENCOUNTER — Telehealth: Payer: Self-pay | Admitting: Internal Medicine

## 2016-04-17 NOTE — Telephone Encounter (Signed)
Pt called in said that he wanted to talk to nurse about the xray results.  He said that he thought there was a disk that Dr Tamala Julian was think there was a issue with ?

## 2016-04-18 NOTE — Telephone Encounter (Signed)
Discussed with pt & scheduled 1-2 wk f/u.

## 2016-04-25 ENCOUNTER — Encounter: Payer: Self-pay | Admitting: *Deleted

## 2016-04-25 ENCOUNTER — Ambulatory Visit (INDEPENDENT_AMBULATORY_CARE_PROVIDER_SITE_OTHER): Payer: 59 | Admitting: Family Medicine

## 2016-04-25 ENCOUNTER — Encounter: Payer: Self-pay | Admitting: Family Medicine

## 2016-04-25 DIAGNOSIS — M5416 Radiculopathy, lumbar region: Secondary | ICD-10-CM

## 2016-04-25 NOTE — Assessment & Plan Note (Signed)
Improving symptoms. Continue same medication that will do more of a oral anti-inflammatory. We discussed icing regimen, discussed return to activity, given a note for work to avoid certain activities. Patient will come back and see me again in 3 weeks. If worsening symptoms consider formal physical therapy or advance imaging

## 2016-04-25 NOTE — Patient Instructions (Addendum)
Good to see you Ice is your friend  Arloa Koh get you to start doing the exercises 3 times a week  OK to do elliptical or biking If lifting decrease weight to 50% and increase 10% a week. Start with machines only  See me again in 3 weeks to make sure we are near perfect

## 2016-04-25 NOTE — Progress Notes (Signed)
[name] [address], [place] [address] Phone: [phone] Subjective:    CC: Low back pain f/u  [id number]  Jim [name] is a 50 y.o. male coming in with complaint of low back pain. Patient past medical history significant for diabetes. Patient found to have more of a lumbar radiculopathy. Patient was given prednisone, home exercises and icing protocol. Patient was to take anything slow. Started on prednisone gabapentin. Patient states  approximate 75% better. Very minimal radiation into the buttocks region. No pain down the leg. No weakness. States that when he is on the prednisone is 90% better. Now having some very mild discomfort again. Stop taking the gabapentin and has not taken pain medication for 48 hours.     Past Medical History:  Diagnosis Date  . Diabetes mellitus 2012  . GERD (gastroesophageal reflux disease)    Past Surgical History:  Procedure Laterality Date  . CLOSED REDUCTION ANKLE FRACTURE     Social History   Social History  . Marital status: Married    Spouse name: N/A  . Number of children: N/A  . Years of education: N/A   Social History Main Topics  . Smoking status: Never Smoker  . Smokeless tobacco: Never Used  . Alcohol use Yes     Comment: ocassionally  . Drug use: No  . Sexual activity: Not Asked   Other Topics Concern  . None   Social History Narrative   Works for [employer]   . Lives in [place]. 2 children, daughter 15YO and son 11YO   Allergies  Allergen Reactions  . Ciprofloxacin Rash   Family History  Problem Relation Age of Onset  . Diabetes Brother   . Parkinsonism Maternal Grandfather   . Stroke Paternal Grandfather     Past medical history, social, surgical and family history all reviewed in electronic medical record.  No pertanent information unless stated regarding to the chief complaint.   Review of Systems: No headache, visual changes, nausea, vomiting,  diarrhea, constipation, dizziness, abdominal pain, skin rash, fevers, chills, night sweats, weight loss, swollen lymph nodes, body aches, joint swelling, muscle aches, chest pain, shortness of breath, mood changes.   Objective  Blood pressure 114/82, pulse 93, weight 154 lb (69.9 kg), SpO2 99 %.  General: No apparent distress alert and oriented x3 mood and affect normal, dressed appropriately.  HEENT: Pupils equal, extraocular movements intact  Respiratory: Patient's speak in full sentences and does not appear short of breath  Cardiovascular: No lower extremity edema, non tender, no erythema  Skin: Warm dry intact with no signs of infection or rash on extremities or on axial skeleton.  Abdomen: Soft nontender  Neuro: Cranial nerves II through XII are intact, neurovascularly intact in all extremities with 2+ DTRs and 2+ pulses.  Lymph: No lymphadenopathy of posterior or anterior cervical chain or axillae bilaterally.  Gait normal with good balance and coordination.  MSK:  Non tender with full range of motion and good stability and symmetric strength and tone of shoulders, elbows, wrist, hip, knee and ankles bilaterally.  Back Exam:  Inspection: Unremarkable  Motion: Flexion 45 deg, Extension 25 deg, Side Bending to 35 deg bilaterally,  Rotation to 35 deg bilaterally  SLR laying: Negative tight hamstrings on the left side XSLR laying: Negative  Palpable tenderness: Mild tenderness still remaining in the paraspinal musculature bilaterally FABER: negative. Sensory change: Gross sensation intact to all lumbar and sacral dermatomes.  Reflexes: 2+ at  both patellar tendons, 2+ at achilles tendons, Babinski's downgoing.  Strength at foot  Plantar-flexion: 5/5 Dorsi-flexion: 5/5 Eversion: 5/5 Inversion: 5/5  Leg strength  Quad: 5/5 Hamstring: 5/5 Hip flexor: 5/5 Hip abductors: 5/5   Gait unremarkable.  Procedure note [id number]; 15 minutes spent for Therapeutic exercises as stated in above notes.   This included exercises focusing on stretching, strengthening, with significant focus on eccentric aspects. Low back exercises that included:  Pelvic tilt/bracing instruction to focus on control of the pelvic girdle and lower abdominal muscles  Glute strengthening exercises, focusing on proper firing of the glutes without engaging the low back muscles Proper stretching techniques for maximum relief for the hamstrings, hip flexors, low back and some rotation where tolerated    Proper technique shown and discussed handout in great detail with ATC.  All questions were discussed and answered.     Impression and Recommendations:     This case required medical decision making of moderate complexity.      Note: This dictation was prepared with Dragon dictation along with smaller phrase technology. Any transcriptional errors that result from this process are unintentional.

## 2016-05-17 ENCOUNTER — Ambulatory Visit: Payer: 59 | Admitting: Family Medicine

## 2016-05-23 NOTE — Progress Notes (Signed)
[name] [address], [place] [address] Phone: [phone] Subjective:    CC: Low back pain f/u  [id number]  MALE Jim [name] is a 50 y.o. male coming in with complaint of low back pain. Patient past medical history significant for diabetes. Patient found to have more of a lumbar radiculopathy. Patient was given prednisone, home exercises and icing protocol.. Patient had made improvement. Patient was doing 75% better. Encouraged to continue the conservative therapy. Was to start increasing activity slowly. Patient states Continues to make some mild improvement. Still not completely gone. Still some mild radiation of pain to the buttocks areas bilaterally but not as much down the leg. Patient is feeling better overall he thinks.   x-rays were ordered, reviewed and independently visualized by me. X-ray 04/13/2016 show no significant bony abnormality.  Past Medical History:  Diagnosis Date  . Diabetes mellitus 2012  . GERD (gastroesophageal reflux disease)    Past Surgical History:  Procedure Laterality Date  . CLOSED REDUCTION ANKLE FRACTURE     Social History   Social History  . Marital status: Married    Spouse name: N/A  . Number of children: N/A  . Years of education: N/A   Social History Main Topics  . Smoking status: Never Smoker  . Smokeless tobacco: Never Used  . Alcohol use Yes     Comment: ocassionally  . Drug use: No  . Sexual activity: Not Asked   Other Topics Concern  . None   Social History Narrative   Works for [employer]   . Lives in [place]. 2 children, daughter 15YO and son 48YO   Allergies  Allergen Reactions  . Ciprofloxacin Rash   Family History  Problem Relation Age of Onset  . Diabetes Brother   . Parkinsonism Maternal Grandfather   . Stroke Paternal Grandfather     Past medical history, social, surgical and family history all reviewed in electronic medical record.  No pertanent  information unless stated regarding to the chief complaint.   Review of Systems: No headache, visual changes, nausea, vomiting, diarrhea, constipation, dizziness, abdominal pain, skin rash, fevers, chills, night sweats, weight loss, swollen lymph nodes, body aches, joint swelling, muscle aches, chest pain, shortness of breath, mood changes.   Objective  Blood pressure 110/82, pulse 84, height 5' 8.5" (1.74 m), weight 160 lb (72.6 kg), SpO2 97 %.  Systems examined below as of 05/24/16 General: NAD A&O x3 mood, affect normal  HEENT: Pupils equal, extraocular movements intact no nystagmus Respiratory: not short of breath at rest or with speaking Cardiovascular: No lower extremity edema, non tender Skin: Warm dry intact with no signs of infection or rash on extremities or on axial skeleton. Abdomen: Soft nontender, no masses Neuro: Cranial nerves  intact, neurovascularly intact in all extremities with 2+ DTRs and 2+ pulses. Lymph: No lymphadenopathy appreciated today  Gait normal with good balance and coordination.  MSK: Non tender with full range of motion and good stability and symmetric strength and tone of shoulders, elbows, wrist,  knee hips and ankles bilaterally.   Back Exam:  Inspection: Unremarkable  Motion: Flexion 45 deg, Extension 25 deg, Side Bending to 45 deg bilaterally,  Rotation to 45 deg bilaterally  SLR laying: Negative t XSLR laying: Negative  Palpable tenderness: Continue mild discomfort in the paraspinal musculature of L5-S1 bilaterally but not as severe as previous. FABER: negative. Sensory change: Gross sensation intact to all lumbar and sacral dermatomes.  Reflexes:  2+ at both patellar tendons, 2+ at achilles tendons, Babinski's downgoing.  Strength at foot  Plantar-flexion: 5/5 Dorsi-flexion: 5/5 Eversion: 5/5 Inversion: 5/5  Leg strength  Quad: 5/5 Hamstring: 5/5 Hip flexor: 5/5 Hip abductors: 5/5   Gait unremarkable.  Osteopathic findings T3 extended rotated  and side bent right L2 flexed rotated and side bent right Sacrum left on left     Impression and Recommendations:     This case required medical decision making of moderate complexity.      Note: This dictation was prepared with Dragon dictation along with smaller phrase technology. Any transcriptional errors that result from this process are unintentional.

## 2016-05-24 ENCOUNTER — Encounter: Payer: Self-pay | Admitting: Family Medicine

## 2016-05-24 ENCOUNTER — Ambulatory Visit (INDEPENDENT_AMBULATORY_CARE_PROVIDER_SITE_OTHER): Payer: 59 | Admitting: Family Medicine

## 2016-05-24 DIAGNOSIS — M999 Biomechanical lesion, unspecified: Secondary | ICD-10-CM | POA: Insufficient documentation

## 2016-05-24 DIAGNOSIS — M5416 Radiculopathy, lumbar region: Secondary | ICD-10-CM

## 2016-05-24 NOTE — Patient Instructions (Signed)
Good to see you  Keep it up  We tried manipulation today and you did great  See me again in 4 weeks.

## 2016-05-24 NOTE — Assessment & Plan Note (Signed)
Making progress at this time. Patient is continuing to take the gabapentin nightly. Patient is considering weaning off this. Attempted osteopathic manipulation with good resolution of pain afterwards. We discussed continuing core exercises. Decline formal physical therapy. Follow-up again in 4-6 weeks.

## 2016-05-24 NOTE — Assessment & Plan Note (Signed)
Decision today to treat with OMT was based on Physical Exam  After verbal consent patient was treated with HVLA, ME techniques in thoracic, lumbar and sacral areas  Patient tolerated the procedure well with improvement in symptoms  Patient given exercises, stretches and lifestyle modifications  See medications in patient instructions if given  Patient will follow up in 4 weeks

## 2016-06-15 MED FILL — glipiZIDE 10 MG TABS: 10 | 90 days supply | Qty: 180 | Fill #2

## 2016-06-20 NOTE — Progress Notes (Signed)
[name] [place], [place] [address] Phone: [phone] Subjective:    CC: Low back pain f/u  [id number]  Jim [name] is a 50 y.o. male coming in with complaint of low back pain. Patient past medical history significant for diabetes. Patient found to have more of a lumbar radiculopathy. Patient was given prednisone, home exercises and icing protocol.. Was doing approximately 75% better. We attempted osteopathic manipulation and patient increase his activity. Patient states  Now doing a proximal wing 95% better. Starting to increase activity. No longer having any radicular symptoms. Did respond very well to osteopathic manipulation. No new symptoms. Mild tightness in the back. Has been working out more regularly.  x-rays were ordered, reviewed and independently visualized by me. X-ray 04/13/2016 show no significant bony abnormality.  Past Medical History:  Diagnosis Date  . Diabetes mellitus 2012  . GERD (gastroesophageal reflux disease)    Past Surgical History:  Procedure Laterality Date  . CLOSED REDUCTION ANKLE FRACTURE     Social History   Social History  . Marital status: Married    Spouse name: N/A  . Number of children: N/A  . Years of education: N/A   Social History Main Topics  . Smoking status: Never Smoker  . Smokeless tobacco: Never Used  . Alcohol use Yes     Comment: ocassionally  . Drug use: No  . Sexual activity: Not Asked   Other Topics Concern  . None   Social History Narrative   Works for [employer]   . Lives in [place]. 2 children, daughter 15YO and son 11YO   Allergies  Allergen Reactions  . Ciprofloxacin Rash   Family History  Problem Relation Age of Onset  . Diabetes Brother   . Parkinsonism Maternal Grandfather   . Stroke Paternal Grandfather     Past medical history, social, surgical and family history all reviewed in electronic medical record.  No pertanent information  unless stated regarding to the chief complaint.   Review of Systems: No headache, visual changes, nausea, vomiting, diarrhea, constipation, dizziness, abdominal pain, skin rash, fevers, chills, night sweats, weight loss, swollen lymph nodes, body aches, joint swelling, muscle aches, chest pain, shortness of breath, mood changes.   Objective  Blood pressure 116/72, pulse 94, height 5' 8.5" (1.74 m), weight 161 lb (73 kg), SpO2 97 %.  Systems examined below as of 06/21/16 General: NAD A&O x3 mood, affect normal  HEENT: Pupils equal, extraocular movements intact no nystagmus Respiratory: not short of breath at rest or with speaking Cardiovascular: No lower extremity edema, non tender Skin: Warm dry intact with no signs of infection or rash on extremities or on axial skeleton. Abdomen: Soft nontender, no masses Neuro: Cranial nerves  intact, neurovascularly intact in all extremities with 2+ DTRs and 2+ pulses. Lymph: No lymphadenopathy appreciated today  Gait normal with good balance and coordination.  MSK: Non tender with full range of motion and good stability and symmetric strength and tone of shoulders, elbows, wrist,  knee hips and ankles bilaterally.    Back Exam:  Inspection: Unremarkable  Motion: Flexion 40 deg, Extension 2 5deg, Side Bending to 35 deg bilaterally,  Rotation to 35 deg bilaterally  SLR laying: Negative  XSLR laying: Negative  Palpable tenderness: Significant improvement in pain from previous exam FABER: negative. Sensory change: Gross sensation intact to all lumbar and sacral dermatomes.  Reflexes: 2+ at both patellar tendons, 2+ at achilles tendons, Babinski's downgoing.  Strength at foot  Plantar-flexion: 5/5 Dorsi-flexion: 5/5 Eversion: 5/5 Inversion: 5/5  Leg strength  Quad: 5/5 Hamstring: 5/5 Hip flexor: 5/5 Hip abductors: 5/5   Gait unremarkable.  Osteopathic findings T3 extended rotated and side bent right L4 flexed rotated and side bent right Sacrum  left on left     Impression and Recommendations:     This case required medical decision making of moderate complexity.      Note: This dictation was prepared with Dragon dictation along with smaller phrase technology. Any transcriptional errors that result from this process are unintentional.

## 2016-06-21 ENCOUNTER — Ambulatory Visit (INDEPENDENT_AMBULATORY_CARE_PROVIDER_SITE_OTHER): Payer: 59 | Admitting: Family Medicine

## 2016-06-21 ENCOUNTER — Encounter: Payer: Self-pay | Admitting: Family Medicine

## 2016-06-21 VITALS — BP 116/72 | HR 94 | Ht 68.5 in | Wt 161.0 lb

## 2016-06-21 DIAGNOSIS — M999 Biomechanical lesion, unspecified: Secondary | ICD-10-CM

## 2016-06-21 DIAGNOSIS — M5416 Radiculopathy, lumbar region: Secondary | ICD-10-CM | POA: Diagnosis not present

## 2016-06-21 NOTE — Patient Instructions (Signed)
Good to see you  Lacinda Axon or Caryl Bis Keep it up  Happy holidays!  See me again in 6-8 weeks.

## 2016-06-21 NOTE — Assessment & Plan Note (Signed)
Decision today to treat with OMT was based on Physical Exam  After verbal consent patient was treated with HVLA, ME techniques in thoracic, lumbar and sacral areas  Patient tolerated the procedure well with improvement in symptoms  Patient given exercises, stretches and lifestyle modifications  See medications in patient instructions if given  Patient will follow up in 6-8 weeks    

## 2016-06-21 NOTE — Assessment & Plan Note (Signed)
No longer having radicular symptoms. Has responded very well to conservative therapy. Still responds well to osteopathic manipulation. Encourage to work on core strength and the home exercises. Follow-up again in 6-8 weeks.

## 2016-07-21 MED FILL — METFORMIN HCL ER 750 MG TAB: 750 | 90 days supply | Qty: 180 | Fill #3

## 2016-08-08 NOTE — Progress Notes (Signed)
[name] [place], [place] [address] Phone: [phone] Subjective:    CC: Low back pain f/u  [id number]  Jim [name] is a 51 y.o. male coming in with complaint of low back pain. Patient past medical history significant for diabetes. Patient found to have more of a lumbar radiculopathy. Patient was given prednisone, home exercises and icing protocol..  Patient did very well with conservative therapy and has done very well with osteopathic manipulation. Started having some very mild discomfort going down the left leg but nothing severe. Only been going on for approximately week but is worsening of previous symptoms. Denies any new symptoms. Denies any weakness or any constant numbness.  x-rays were ordered, reviewed and independently visualized by me. X-ray 04/13/2016 show no significant bony abnormality.  Past Medical History:  Diagnosis Date  . Diabetes mellitus 2012  . GERD (gastroesophageal reflux disease)    Past Surgical History:  Procedure Laterality Date  . CLOSED REDUCTION ANKLE FRACTURE     Social History   Social History  . Marital status: Married    Spouse name: N/A  . Number of children: N/A  . Years of education: N/A   Social History Main Topics  . Smoking status: Never Smoker  . Smokeless tobacco: Never Used  . Alcohol use Yes     Comment: ocassionally  . Drug use: No  . Sexual activity: Not on file   Other Topics Concern  . Not on file   Social History Narrative   Works for [employer]   . Lives in [place]. 2 children, daughter 15YO and son 11YO   Allergies  Allergen Reactions  . Ciprofloxacin Rash   Family History  Problem Relation Age of Onset  . Diabetes Brother   . Parkinsonism Maternal Grandfather   . Stroke Paternal Grandfather     Past medical history, social, surgical and family history all reviewed in electronic medical record.  No pertanent information unless stated regarding  to the chief complaint.   Review of Systems: No headache, visual changes, nausea, vomiting, diarrhea, constipation, dizziness, abdominal pain, skin rash, fevers, chills, night sweats, weight loss, swollen lymph nodes, body aches, joint swelling, muscle aches, chest pain, shortness of breath, mood changes.    Objective  There were no vitals taken for this visit.  Systems examined below as of 08/09/16 General: NAD A&O x3 mood, affect normal  HEENT: Pupils equal, extraocular movements intact no nystagmus Respiratory: not short of breath at rest or with speaking Cardiovascular: No lower extremity edema, non tender Skin: Warm dry intact with no signs of infection or rash on extremities or on axial skeleton. Abdomen: Soft nontender, no masses Neuro: Cranial nerves  intact, neurovascularly intact in all extremities with 2+ DTRs and 2+ pulses. Lymph: No lymphadenopathy appreciated today  Gait normal with good balance and coordination.  MSK: Non tender with full range of motion and good stability and symmetric strength and tone of shoulders, elbows, wrist,  knee hips and ankles bilaterally.    Back Exam:  Inspection: Unremarkable  Motion: Flexion 45 deg, Extension 45 deg, Side Bending to 45 deg bilaterally,  Rotation to 45 deg bilaterally  SLR laying: Negative tenderness of the left hamstring XSLR laying: Negative  Palpable tenderness: None. FABER: negative. Sensory change: Gross sensation intact to all lumbar and sacral dermatomes.  Reflexes: 2+ at both patellar tendons, 2+ at achilles tendons, Babinski's downgoing.  Strength at foot  Plantar-flexion: 5/5 Dorsi-flexion: 5/5 Eversion:  5/5 Inversion: 5/5  Leg strength  Quad: 5/5 Hamstring: 5/5 Hip flexor: 5/5 Hip abductors: 5/5  Gait unremarkable.  Osteopathic findings Cervical C2 flexed rotated and side bent right C4 flexed rotated and side bent left C6 flexed rotated and side bent left T3 extended rotated and side bent right inhaled  third rib T10 extended rotated and side bent left L2 flexed rotated and side bent right Sacrum right on right      Impression and Recommendations:     This case required medical decision making of moderate complexity.      Note: This dictation was prepared with Dragon dictation along with smaller phrase technology. Any transcriptional errors that result from this process are unintentional.

## 2016-08-09 ENCOUNTER — Ambulatory Visit (INDEPENDENT_AMBULATORY_CARE_PROVIDER_SITE_OTHER): Payer: 59 | Admitting: Family Medicine

## 2016-08-09 ENCOUNTER — Encounter: Payer: Self-pay | Admitting: Family Medicine

## 2016-08-09 VITALS — BP 112/72 | HR 92 | Ht 68.5 in | Wt 158.0 lb

## 2016-08-09 DIAGNOSIS — M5416 Radiculopathy, lumbar region: Secondary | ICD-10-CM | POA: Diagnosis not present

## 2016-08-09 DIAGNOSIS — M999 Biomechanical lesion, unspecified: Secondary | ICD-10-CM | POA: Diagnosis not present

## 2016-08-09 NOTE — Assessment & Plan Note (Signed)
Decision today to treat with OMT was based on Physical Exam  After verbal consent patient was treated with HVLA, ME, FPR techniques in cervical, thoracic, lumbar and sacral areas  Patient tolerated the procedure well with improvement in symptoms  Patient given exercises, stretches and lifestyle modifications  See medications in patient instructions if given  Patient will follow up in 6-8 weeks 

## 2016-08-09 NOTE — Assessment & Plan Note (Signed)
Stable no chronic radiation down the leg and no numbness. Does respond well to the manipulation. Has tramadol for any type breakthrough pain. Follow-up again in 6-8 weeks. No significant change in management today.

## 2016-08-09 NOTE — Patient Instructions (Signed)
God to see you You are doing great  Keep up with the exercises See me again in 6-8 weeks.

## 2016-08-17 ENCOUNTER — Other Ambulatory Visit: Payer: Self-pay | Admitting: Family Medicine

## 2016-08-17 ENCOUNTER — Telehealth: Payer: Self-pay | Admitting: Family Medicine

## 2016-08-17 DIAGNOSIS — Z130 Encounter for screening for diseases of the blood and blood-forming organs and certain disorders involving the immune mechanism: Secondary | ICD-10-CM

## 2016-08-17 DIAGNOSIS — E785 Hyperlipidemia, unspecified: Secondary | ICD-10-CM

## 2016-08-17 DIAGNOSIS — E119 Type 2 diabetes mellitus without complications: Secondary | ICD-10-CM

## 2016-08-17 NOTE — Telephone Encounter (Signed)
Pt called and scheduled for 08/21/16 at 9:15 a.m.

## 2016-08-17 NOTE — Telephone Encounter (Signed)
Labs in. He can schedule lab appt.

## 2016-08-17 NOTE — Telephone Encounter (Signed)
Pt called and asked if Dr. Lacinda Axon could order labs to be done before his appt on 2/6. Please advise, thank you!  Call pt @ [phone]

## 2016-08-17 NOTE — Telephone Encounter (Signed)
Recommendations?

## 2016-08-21 ENCOUNTER — Other Ambulatory Visit (INDEPENDENT_AMBULATORY_CARE_PROVIDER_SITE_OTHER): Payer: 59

## 2016-08-21 DIAGNOSIS — E785 Hyperlipidemia, unspecified: Secondary | ICD-10-CM | POA: Diagnosis not present

## 2016-08-21 DIAGNOSIS — E119 Type 2 diabetes mellitus without complications: Secondary | ICD-10-CM | POA: Diagnosis not present

## 2016-08-21 DIAGNOSIS — Z130 Encounter for screening for diseases of the blood and blood-forming organs and certain disorders involving the immune mechanism: Secondary | ICD-10-CM

## 2016-08-21 LAB — HEMOGLOBIN A1C: HEMOGLOBIN A1C: 7.6 % — AB (ref 4.6–6.5)

## 2016-08-21 LAB — LIPID PANEL
CHOLESTEROL: 138 mg/dL (ref 0–200)
HDL: 41.8 mg/dL (ref >39.00)
LDL Cholesterol: 65 mg/dL (ref 0–99)
NonHDL: 96.24
TRIGLYCERIDES: 157 mg/dL — AB (ref 0.0–149.0)
Total CHOL/HDL Ratio: 3
VLDL: 31.4 mg/dL (ref 0.0–40.0)

## 2016-08-21 LAB — COMPREHENSIVE METABOLIC PANEL
ALBUMIN: 4.4 g/dL (ref 3.5–5.2)
ALK PHOS: 72 U/L (ref 39–117)
ALT: 30 U/L (ref 0–53)
AST: 15 U/L (ref 0–37)
BUN: 21 mg/dL (ref 6–23)
CALCIUM: 9.4 mg/dL (ref 8.4–10.5)
CO2: 25 mEq/L (ref 19–32)
Chloride: 104 mEq/L (ref 96–112)
Creatinine, Ser: 1.02 mg/dL (ref 0.40–1.50)
GFR: 82.61 mL/min (ref >60.00)
GLUCOSE: 190 mg/dL — AB (ref 70–99)
POTASSIUM: 4.1 meq/L (ref 3.5–5.1)
Sodium: 139 mEq/L (ref 135–145)
Total Bilirubin: 0.5 mg/dL (ref 0.2–1.2)
Total Protein: 7 g/dL (ref 6.0–8.3)

## 2016-08-21 LAB — CBC
HEMATOCRIT: 46.3 % (ref 39.0–52.0)
HEMOGLOBIN: 15.7 g/dL (ref 13.0–17.0)
MCHC: 33.8 g/dL (ref 30.0–36.0)
MCV: 84.6 fl (ref 78.0–100.0)
Platelets: 342 10*3/uL (ref 150.0–400.0)
RBC: 5.47 Mil/uL (ref 4.22–5.81)
RDW: 12.8 % (ref 11.5–15.5)
WBC: 14.5 10*3/uL — ABNORMAL HIGH (ref 4.0–10.5)

## 2016-08-22 ENCOUNTER — Telehealth: Payer: Self-pay | Admitting: Family Medicine

## 2016-08-22 ENCOUNTER — Other Ambulatory Visit: Payer: Self-pay | Admitting: Family Medicine

## 2016-08-22 ENCOUNTER — Encounter: Payer: Self-pay | Admitting: Family Medicine

## 2016-08-22 ENCOUNTER — Ambulatory Visit (INDEPENDENT_AMBULATORY_CARE_PROVIDER_SITE_OTHER): Payer: 59 | Admitting: Family Medicine

## 2016-08-22 DIAGNOSIS — J069 Acute upper respiratory infection, unspecified: Secondary | ICD-10-CM | POA: Diagnosis not present

## 2016-08-22 DIAGNOSIS — E785 Hyperlipidemia, unspecified: Secondary | ICD-10-CM | POA: Diagnosis not present

## 2016-08-22 DIAGNOSIS — E119 Type 2 diabetes mellitus without complications: Secondary | ICD-10-CM | POA: Diagnosis not present

## 2016-08-22 NOTE — Patient Instructions (Signed)
Continue your meds.  Follow up in 3 months.  Take care  Dr. Daionna Crossland  

## 2016-08-22 NOTE — Assessment & Plan Note (Signed)
At goal.  

## 2016-08-22 NOTE — Telephone Encounter (Signed)
Lab orders needed

## 2016-08-22 NOTE — Assessment & Plan Note (Signed)
Not at goal. Discussed adding Jardiance. Patient wants to wait. Continue Metformin and Glipizide. F/U in 3 months.

## 2016-08-22 NOTE — Progress Notes (Signed)
Subjective:  Patient ID: Jim [name], male    DOB: May 17, 1966  Age: 51 y.o. MRN: [id number]  CC: Follow up  HPI:  51 year old male with DM-2 and hyperlipidemia presents for follow up.  Also complains of URI symptoms.  DM-2  Does not check his sugars regularly.  Endorses compliance with glipizide and metformin.  No reported side effects.  A1c was done before the appointment and was elevated at 7.6.  HLD  At goal.  No meds at this time.  URI  Patient reporting a recent development of URI symptoms.  Cough, Cold, runny nose.  No associated fevers or chills.  No known exacerbating or relieving factors.  No other associated symptoms.  Social Hx   Social History   Social History  . Marital status: Married    Spouse name: N/A  . Number of children: N/A  . Years of education: N/A   Social History Main Topics  . Smoking status: Never Smoker  . Smokeless tobacco: Never Used  . Alcohol use Yes     Comment: ocassionally  . Drug use: No  . Sexual activity: Not Asked   Other Topics Concern  . None   Social History Narrative   Works for [employer]   . Lives in [place]. 2 children, daughter 15YO and son 11YO    Review of Systems  Constitutional: Negative.   Cardiovascular: Negative.    Objective:  BP 121/79   Pulse (!) 111   Temp 98 F (36.7 C) (Oral)   Wt 160 lb (72.6 kg)   SpO2 98%   BMI 23.97 kg/m   BP/Weight 08/22/2016 08/09/2016 [id number]  Systolic BP 123XX123 [id number]  Diastolic BP 79 72 72  Wt. (Lbs) 160 158 161  BMI 23.97 23.67 24.12   Physical Exam  Constitutional: He is oriented to person, place, and time. He appears well-developed. No distress.  HENT:  Mouth/Throat: Oropharynx is clear and moist.  Cardiovascular: Regular rhythm.   Tachycardia.  Pulmonary/Chest: Effort normal and breath sounds normal.  Neurological: He is alert and oriented to person, place, and time.  Psychiatric: He has a normal mood and affect.  Vitals  reviewed.   Lab Results  Component Value Date   WBC 14.5 (H) 08/21/2016   HGB 15.7 08/21/2016   HCT 46.3 08/21/2016   PLT 342.0 08/21/2016   GLUCOSE 190 (H) 08/21/2016   CHOL 138 08/21/2016   TRIG 157.0 (H) 08/21/2016   HDL 41.80 08/21/2016   LDLDIRECT 90.0 04/30/2015   LDLCALC 65 08/21/2016   ALT 30 08/21/2016   AST 15 08/21/2016   NA 139 08/21/2016   K 4.1 08/21/2016   CL 104 08/21/2016   CREATININE 1.02 08/21/2016   BUN 21 08/21/2016   CO2 25 08/21/2016   TSH 1.164 03/06/2011   INR 1.00 03/06/2011   HGBA1C 7.6 (H) 08/21/2016   MICROALBUR 0.9 10/25/2015    Assessment & Plan:   Problem List Items Addressed This Visit    Hyperlipidemia    At goal.      DM (diabetes mellitus), type 2 ([place])    Not at goal. Discussed adding Jardiance. Patient wants to wait. Continue Metformin and Glipizide. F/U in 3 months.      URI (upper respiratory infection)    New problem. Exam notable for cerumen impaction bilaterally. Irrigated with improvement. Supportive care.        Follow-up: Return in about 3 months (around 11/19/2016).  [place]

## 2016-08-22 NOTE — Assessment & Plan Note (Signed)
New problem. Exam notable for cerumen impaction bilaterally. Irrigated with improvement. Supportive care.

## 2016-08-22 NOTE — Telephone Encounter (Signed)
Pt would like to have lab done before his next appt 5/8. Please advise, thank you!

## 2016-09-26 NOTE — Progress Notes (Signed)
[name] [place], [place] [address] Phone: [phone] Subjective:    CC: Low back pain f/u  [id number]  Jim [name] is a 51 y.o. male coming in with complaint of low back pain. Patient past medical history significant for diabetes. Patient found to have more of a lumbar radiculopathy. Patient was given prednisone, home exercises and icing protocol..  Patient did very well with conservative therapy and has done very well with osteopathic manipulation.  A shot last follow-up was having significant decrease in the radicular symptoms. Patient was starting to increase activity as well. Patient states   x-rays were ordered, reviewed and independently visualized by me. X-ray 04/13/2016 show no significant bony abnormality.  Past Medical History:  Diagnosis Date  . Diabetes mellitus 2012  . GERD (gastroesophageal reflux disease)    Past Surgical History:  Procedure Laterality Date  . CLOSED REDUCTION ANKLE FRACTURE     Social History   Social History  . Marital status: Married    Spouse name: N/A  . Number of children: N/A  . Years of education: N/A   Social History Main Topics  . Smoking status: Never Smoker  . Smokeless tobacco: Never Used  . Alcohol use Yes     Comment: ocassionally  . Drug use: No  . Sexual activity: Not Asked   Other Topics Concern  . None   Social History Narrative   Works for [employer]   . Lives in [place]. 2 children, daughter 15YO and son 11YO   Allergies  Allergen Reactions  . Ciprofloxacin Rash   Family History  Problem Relation Age of Onset  . Stroke Paternal Grandfather   . Diabetes Brother   . Parkinsonism Maternal Grandfather     Past medical history, social, surgical and family history all reviewed in electronic medical record.  No pertanent information unless stated regarding to the chief complaint.   Review of Systems: No headache, visual changes, nausea, vomiting,  diarrhea, constipation, dizziness, abdominal pain, skin rash, fevers, chills, night sweats, weight loss, swollen lymph nodes, body aches, joint swelling, chest pain, shortness of breath, mood changes.   Mild positive muscle aches   Objective  Blood pressure 106/72, pulse 90, height 5' 8.5" (1.74 m), weight 159 lb (72.1 kg), SpO2 98 %.  Systems examined below as of 09/27/16 General: NAD A&O x3 mood, affect normal  HEENT: Pupils equal, extraocular movements intact no nystagmus Respiratory: not short of breath at rest or with speaking Cardiovascular: No lower extremity edema, non tender Skin: Warm dry intact with no signs of infection or rash on extremities or on axial skeleton. Abdomen: Soft nontender, no masses Neuro: Cranial nerves  intact, neurovascularly intact in all extremities with 2+ DTRs and 2+ pulses. Lymph: No lymphadenopathy appreciated today  Gait normal with good balance and coordination.  MSK: Non tender with full range of motion and good stability and symmetric strength and tone of shoulders, elbows, wrist,  knee hips and ankles bilaterally.    Back Exam:  Inspection: Unremarkable  Motion: Flexion 45 deg, Extension 25 deg, Side Bending to 45 deg bilaterally,  Rotation to 45 deg bilaterally  SLR laying: Negative tenderness of the left hamstring XSLR laying: Negative  Palpable tenderness: Tender to palpation in the paraspinal musculature of the thoracal lumbar junction right greater than left FABER: Mild increasing tightness on the right side Sensory change: Gross sensation intact to all lumbar and sacral dermatomes.  Reflexes: 2+ at both patellar  tendons, 2+ at achilles tendons, Babinski's downgoing.  Strength at foot  Plantar-flexion: 5/5 Dorsi-flexion: 5/5 Eversion: 5/5 Inversion: 5/5  Leg strength  Quad: 5/5 Hamstring: 5/5 Hip flexor: 5/5 Hip abductors: 5/5  Gait unremarkable.  Osteopathic findings Cervical C2 flexed rotated and side bent right C4 flexed rotated  and side bent left C7 flexed rotated and side bent left T3 extended rotated and side bent right inhaled third rib T8 extended rotated and side bent left L2 flexed rotated and side bent right Sacrum right on right       Impression and Recommendations:     This case required medical decision making of moderate complexity.      Note: This dictation was prepared with Dragon dictation along with smaller phrase technology. Any transcriptional errors that result from this process are unintentional.

## 2016-09-27 ENCOUNTER — Encounter: Payer: Self-pay | Admitting: Family Medicine

## 2016-09-27 ENCOUNTER — Ambulatory Visit (INDEPENDENT_AMBULATORY_CARE_PROVIDER_SITE_OTHER): Payer: 59 | Admitting: Family Medicine

## 2016-09-27 VITALS — BP 106/72 | HR 90 | Ht 68.5 in | Wt 159.0 lb

## 2016-09-27 DIAGNOSIS — M5416 Radiculopathy, lumbar region: Secondary | ICD-10-CM

## 2016-09-27 DIAGNOSIS — M999 Biomechanical lesion, unspecified: Secondary | ICD-10-CM | POA: Diagnosis not present

## 2016-09-27 NOTE — Patient Instructions (Addendum)
Good to see you  I am glad you are doing well.  I am impressed Vitamin D 2000 IU daily  Start increasing activity as tolerated.  See me again in 2 months!

## 2016-09-27 NOTE — Assessment & Plan Note (Signed)
Patient is no longer having any radicular symptoms. Seems to be doing really well. Not taking the gabapentin regularly. We discussed icing regimen, and discuss core strengthening ergonomics. Patient is doing very well. We discussed the vitamin D. Follow-up again in 2 months

## 2016-09-27 NOTE — Assessment & Plan Note (Signed)
Decision today to treat with OMT was based on Physical Exam  After verbal consent patient was treated with HVLA, ME, FPR techniques in cervical, thoracic, lumbar and sacral areas  Patient tolerated the procedure well with improvement in symptoms  Patient given exercises, stretches and lifestyle modifications  See medications in patient instructions if given  Patient will follow up in 8 weeks 

## 2016-10-04 ENCOUNTER — Other Ambulatory Visit: Payer: Self-pay | Admitting: Family Medicine

## 2016-10-04 MED ORDER — GLIPIZIDE 10 MG PO TABS: 10.0000 mg | ORAL_TABLET | Freq: Two times a day (BID) | ORAL | 3 refills | Status: DC

## 2016-10-04 MED FILL — glipiZIDE 10 MG TABS: 10 | 90 days supply | Qty: 180 | Fill #0

## 2016-11-06 ENCOUNTER — Telehealth: Payer: Self-pay | Admitting: Family Medicine

## 2016-11-06 MED ORDER — METFORMIN HCL ER 750 MG PO TB24: 750.0000 mg | ORAL_TABLET | Freq: Two times a day (BID) | ORAL | 1 refills | Status: DC

## 2016-11-06 MED FILL — METFORMIN HCL ER 750 MG TAB: 750 | 90 days supply | Qty: 180 | Fill #0

## 2016-11-06 NOTE — Telephone Encounter (Signed)
rx sent

## 2016-11-06 NOTE — Telephone Encounter (Signed)
Pt called needing a refill for the medication of metFORMIN (GLUCOPHAGE XR) 750 MG 24 hr tablet.. Pt is out of medication.  Pharmacy is [place], [place] - [address] [facility].  Call pt @ [phone]. Thank you!

## 2016-11-22 ENCOUNTER — Encounter: Payer: Self-pay | Admitting: Family Medicine

## 2016-11-22 ENCOUNTER — Ambulatory Visit (INDEPENDENT_AMBULATORY_CARE_PROVIDER_SITE_OTHER): Payer: 59 | Admitting: Family Medicine

## 2016-11-22 DIAGNOSIS — E119 Type 2 diabetes mellitus without complications: Secondary | ICD-10-CM

## 2016-11-22 DIAGNOSIS — E785 Hyperlipidemia, unspecified: Secondary | ICD-10-CM

## 2016-11-22 LAB — POCT GLYCOSYLATED HEMOGLOBIN (HGB A1C): Hemoglobin A1C: 7.4

## 2016-11-22 MED ORDER — BLOOD GLUCOSE TEST VI STRP: ORAL_STRIP | 5 refills | Status: AC

## 2016-11-22 MED ORDER — EMPAGLIFLOZIN 10 MG PO TABS: 10.0000 mg | ORAL_TABLET | Freq: Every day | ORAL | 3 refills | Status: DC

## 2016-11-22 MED ORDER — TRUEPLUS LANCETS 30G MISC: 11 refills | Status: AC

## 2016-11-22 MED FILL — TRUEplus LANCETS 30G MISC: 90 days supply | Qty: 100 | Fill #0

## 2016-11-22 MED FILL — TRUE METRIX GLUCOSE TEST ST: 30 days supply | Qty: 100 | Fill #0

## 2016-11-22 NOTE — Patient Instructions (Signed)
Continue the metformin.  Stop the glipizide.  Start the [employer].  Follow up in 3 months.  Take care  Dr. Lacinda Axon

## 2016-11-22 NOTE — Assessment & Plan Note (Signed)
Has been well controlled (LDL <70) without medication. Will continue to monitor.

## 2016-11-22 NOTE — Assessment & Plan Note (Signed)
Uncontrolled. Continue metformin. Adding Jardiance. Stopping Glipizide.

## 2016-11-22 NOTE — Progress Notes (Signed)
Subjective:  Patient ID: Jim [name], male    DOB: 11-23-1965  Age: 51 y.o. MRN: [id number]  CC: Follow up  HPI:  51 year old male with DM 2, hyperlipidemia presents for follow-up.  DM-2  Currently compliant with metformin and glipizide.  Last A1c was 7.6.  Needs A1c today.  Hyperlipidemia  Has been well controlled and at goal without medication.  Last LDL was 65.  Social Hx   Social History   Social History  . Marital status: Married    Spouse name: N/A  . Number of children: N/A  . Years of education: N/A   Social History Main Topics  . Smoking status: Never Smoker  . Smokeless tobacco: Never Used  . Alcohol use Yes     Comment: ocassionally  . Drug use: No  . Sexual activity: Not Asked   Other Topics Concern  . None   Social History Narrative   Works for [employer]   . Lives in [place]. 2 children, daughter 15YO and son 11YO    Review of Systems  Constitutional: Negative.   Endocrine: Negative.    Objective:  BP 108/74 (BP Location: Left Arm, Patient Position: Sitting, Cuff Size: Normal)   Pulse 76   Temp 98.1 F (36.7 C) (Oral)   Resp 16   Wt 157 lb 6 oz (71.4 kg)   SpO2 98%   BMI 23.58 kg/m   BP/Weight 11/22/2016 5/64/3329 11/15/8839  Systolic BP 660 630 160  Diastolic BP 74 72 79  Wt. (Lbs) 157.38 159 160  BMI 23.58 23.82 23.97   Physical Exam  Constitutional: He is oriented to person, place, and time. He appears well-developed. No distress.  Cardiovascular: Normal rate and regular rhythm.   Pulmonary/Chest: Effort normal and breath sounds normal. He has no wheezes. He has no rales.  Neurological: He is alert and oriented to person, place, and time.  Psychiatric: He has a normal mood and affect.  Vitals reviewed.   Lab Results  Component Value Date   WBC 14.5 (H) 08/21/2016   HGB 15.7 08/21/2016   HCT 46.3 08/21/2016   PLT 342.0 08/21/2016   GLUCOSE 190 (H) 08/21/2016   CHOL 138 08/21/2016   TRIG 157.0 (H)  08/21/2016   HDL 41.80 08/21/2016   LDLDIRECT 90.0 04/30/2015   LDLCALC 65 08/21/2016   ALT 30 08/21/2016   AST 15 08/21/2016   NA 139 08/21/2016   K 4.1 08/21/2016   CL 104 08/21/2016   CREATININE 1.02 08/21/2016   BUN 21 08/21/2016   CO2 25 08/21/2016   TSH 1.164 03/06/2011   INR 1.00 03/06/2011   HGBA1C 7.4 11/22/2016   MICROALBUR 0.9 10/25/2015    Assessment & Plan:   Problem List Items Addressed This Visit    DM (diabetes mellitus), type 2 ([place])    Uncontrolled. Continue metformin. Adding Jardiance. Stopping Glipizide.      Relevant Medications   empagliflozin (JARDIANCE) 10 MG TABS tablet   Other Relevant Orders   POCT glycosylated hemoglobin (Hb A1C) (Completed)   Hyperlipidemia    Has been well controlled (LDL <70) without medication. Will continue to monitor.          Meds ordered this encounter  Medications  . TRUEPLUS LANCETS 30G MISC    Sig: USE AS DIRECTED    Dispense:  100 each    Refill:  11    PATIENT NEEDS NEW RX FOR LANCETS.  . empagliflozin (JARDIANCE) 10 MG TABS tablet  Sig: Take 10 mg by mouth daily.    Dispense:  90 tablet    Refill:  3  . Glucose Blood (BLOOD GLUCOSE TEST STRIPS) STRP    Sig: True Result test strips, to test blood glucose 3 times daily, use as directed    Dispense:  100 each    Refill:  5   Follow-up: 3 months  [place]

## 2016-11-28 NOTE — Progress Notes (Signed)
[name] [address] [place], [place] [address] Phone: [phone] Subjective:    CC: Low back pain f/u  [id number]  Jim [name] is a 51 y.o. male coming in with complaint of low back pain. Patient past medical history significant for diabetes. Patient found to have more of a lumbar radiculopathy. Patient was given prednisone, home exercises and icing protocol..  Patient did very well with conservative therapy and has done very well with osteopathic manipulation.  A shot last follow-up was having significant decrease in the radicular symptoms. Patient was starting to increase activity as well. Patient states Did move his daughter out of college and had to go up and down 3 flights of stairs repetitively. Discussed an exacerbation. Increasing tightness. Denies any significant radiation at this point. States that he can get more of a burning sensations the buttocks bilaterally.   x-rays were ordered, reviewed and independently visualized by me. X-ray 04/13/2016 show no significant bony abnormality.  Past Medical History:  Diagnosis Date  . Diabetes mellitus 2012  . GERD (gastroesophageal reflux disease)    Past Surgical History:  Procedure Laterality Date  . CLOSED REDUCTION ANKLE FRACTURE     Social History   Social History  . Marital status: Married    Spouse name: N/A  . Number of children: N/A  . Years of education: N/A   Social History Main Topics  . Smoking status: Never Smoker  . Smokeless tobacco: Never Used  . Alcohol use Yes     Comment: ocassionally  . Drug use: No  . Sexual activity: Not Asked   Other Topics Concern  . None   Social History Narrative   Works for [employer]   . Lives in [place]. 2 children, daughter 15YO and son 11YO   Allergies  Allergen Reactions  . Ciprofloxacin Rash   Family History  Problem Relation Age of Onset  . Stroke Paternal Grandfather   . Diabetes Brother   . Parkinsonism  Maternal Grandfather     Past medical history, social, surgical and family history all reviewed in electronic medical record.  No pertanent information unless stated regarding to the chief complaint.   Review of Systems: No headache, visual changes, nausea, vomiting, diarrhea, constipation, dizziness, abdominal pain, skin rash, fevers, chills, night sweats, weight loss, swollen lymph nodes, body aches, joint swelling, chest pain, shortness of breath, mood changes.  Positive muscle aches   Objective  Blood pressure 104/70, pulse 95, height 5' 8.5" (1.74 m), weight 156 lb (70.8 kg), SpO2 98 %.  Systems examined below as of 11/29/16 General: NAD A&O x3 mood, affect normal  HEENT: Pupils equal, extraocular movements intact no nystagmus Respiratory: not short of breath at rest or with speaking Cardiovascular: No lower extremity edema, non tender Skin: Warm dry intact with no signs of infection or rash on extremities or on axial skeleton. Abdomen: Soft nontender, no masses Neuro: Cranial nerves  intact, neurovascularly intact in all extremities with 2+ DTRs and 2+ pulses. Lymph: No lymphadenopathy appreciated today  Gait normal with good balance and coordination.  MSK: Non tender with full range of motion and good stability and symmetric strength and tone of shoulders, elbows, wrist,  knee hips and ankles bilaterally.    Back Exam:  Inspection: Unremarkable  Motion: Flexion 40 deg, Extension 25 deg, Side Bending to 40 deg bilaterally,  Rotation to 40 deg bilaterally mild increase in stiffness SLR laying: Negative  XSLR laying: Negative  Palpable tenderness:  Tender to palpation in the paraspinal musculature of the lumbar spine left couldn't and right some more tenderness in the thoracolumbar juncture as well. FABER: negative. Sensory change: Gross sensation intact to all lumbar and sacral dermatomes.  Reflexes: 2+ at both patellar tendons, 2+ at achilles tendons, Babinski's downgoing.    Strength at foot  Plantar-flexion: 5/5 Dorsi-flexion: 5/5 Eversion: 5/5 Inversion: 5/5  Leg strength  Quad: 5/5 Hamstring: 5/5 Hip flexor: 5/5 Hip abductors: 5/5  Gait unremarkable.   Osteopathic findings C2 flexed rotated and side bent right C4 flexed rotated and side bent left C7 flexed rotated and side bent right T3 extended rotated and side bent right inhaled third rib T6 extended rotated and side bent left L2 flexed rotated and side bent right Sacrum right on right     Impression and Recommendations:     This case required medical decision making of moderate complexity.      Note: This dictation was prepared with Dragon dictation along with smaller phrase technology. Any transcriptional errors that result from this process are unintentional.

## 2016-11-29 ENCOUNTER — Ambulatory Visit (INDEPENDENT_AMBULATORY_CARE_PROVIDER_SITE_OTHER): Payer: 59 | Admitting: Family Medicine

## 2016-11-29 ENCOUNTER — Encounter: Payer: Self-pay | Admitting: Family Medicine

## 2016-11-29 VITALS — BP 104/70 | HR 95 | Ht 68.5 in | Wt 156.0 lb

## 2016-11-29 DIAGNOSIS — M5416 Radiculopathy, lumbar region: Secondary | ICD-10-CM

## 2016-11-29 DIAGNOSIS — M999 Biomechanical lesion, unspecified: Secondary | ICD-10-CM

## 2016-11-29 MED FILL — JARDIANCE 10 MG TABLET: 10 | 90 days supply | Qty: 90 | Fill #0

## 2016-11-29 NOTE — Assessment & Plan Note (Signed)
Decision today to treat with OMT was based on Physical Exam  After verbal consent patient was treated with HVLA, ME, FPR techniques in cervical, thoracic, lumbar and sacral areas  Patient tolerated the procedure well with improvement in symptoms  Patient given exercises, stretches and lifestyle modifications  See medications in patient instructions if given  Patient will follow up in 3-6 weeks 

## 2016-11-29 NOTE — Patient Instructions (Signed)
Good to see you Ice is your friend when needed If you get to the gym work on leg strength, I think it could help  Keep trucking away with my exercises  See me again in 3-6 weeks to make sure you are doing well.

## 2016-11-29 NOTE — Assessment & Plan Note (Signed)
Stable at the moment. Patient continues to lose weight. I do hope that this will be beneficial but we discussed the potential of further imaging that patient would like to hold. Likely more of the repetitive activity gives him some difficulty. Patient is going to continue with the medications. Not taking the gabapentin on a regular basis at this time. Encourage him to do so. We discussed different medications for any breaks or pain. Patient will follow-up with me again in 3-6 weeks.

## 2017-01-01 ENCOUNTER — Encounter: Payer: Self-pay | Admitting: Family Medicine

## 2017-01-01 ENCOUNTER — Ambulatory Visit (INDEPENDENT_AMBULATORY_CARE_PROVIDER_SITE_OTHER): Payer: 59 | Admitting: Family Medicine

## 2017-01-01 DIAGNOSIS — M5416 Radiculopathy, lumbar region: Secondary | ICD-10-CM

## 2017-01-01 MED ORDER — MELOXICAM 15 MG PO TABS: 15.0000 mg | ORAL_TABLET | Freq: Every day | ORAL | 0 refills | Status: DC

## 2017-01-01 MED ORDER — METHYLPREDNISOLONE ACETATE 80 MG/ML IJ SUSP
80.0000 mg | Freq: Once | INTRAMUSCULAR | Status: AC
  Administered 2017-01-01: 80 mg via INTRAMUSCULAR

## 2017-01-01 MED ORDER — KETOROLAC TROMETHAMINE 60 MG/2ML IM SOLN
60.0000 mg | Freq: Once | INTRAMUSCULAR | Status: AC
  Administered 2017-01-01: 60 mg via INTRAMUSCULAR

## 2017-01-01 NOTE — Addendum Note (Signed)
Addended by: Douglass Rivers T on: 01/01/2017 01:21 PM   Modules accepted: Orders

## 2017-01-01 NOTE — Assessment & Plan Note (Signed)
Severe worsening symptoms. Patient is on gabapentin RAD. Patient given 2 injections today. We'll avoid prednisone secondary to patient's diabetes. We discussed icing regimen, home exercises, we discussed avoiding certain activities. Following up with me again in 1 week. Worsening symptoms or weakness advance imaging would be warranted.

## 2017-01-01 NOTE — Patient Instructions (Addendum)
Good to see you  We gave you 2 injections today  Meloxicam daily for 10 days start tomorrow.  I front better by wed or Thursday then we will need to consider MRI  See me again in 7-10 days (ok to double book)

## 2017-01-01 NOTE — Progress Notes (Signed)
[name] [address] [place], [place] [address] Phone: [phone] Subjective:    CC: Low back pain f/u  [id number]  Jim [name] is a 51 y.o. male coming in with complaint of low back pain. Patient past medical history significant for diabetes. Patient found to have more of a lumbar radiculopathy. Was doing well. Patient unfortunately is having worsening symptoms again. States that he went to pick up and down. Started having radiation down the left leg again.     x-rays were ordered, reviewed and independently visualized by me. X-ray 04/13/2016 show no significant bony abnormality.  Past Medical History:  Diagnosis Date  . Diabetes mellitus 2012  . GERD (gastroesophageal reflux disease)    Past Surgical History:  Procedure Laterality Date  . CLOSED REDUCTION ANKLE FRACTURE     Social History   Social History  . Marital status: Married    Spouse name: N/A  . Number of children: N/A  . Years of education: N/A   Social History Main Topics  . Smoking status: Never Smoker  . Smokeless tobacco: Never Used  . Alcohol use Yes     Comment: ocassionally  . Drug use: No  . Sexual activity: Not Asked   Other Topics Concern  . None   Social History Narrative   Works for [employer]   . Lives in [place]. 2 children, daughter 15YO and son 11YO   Allergies  Allergen Reactions  . Ciprofloxacin Rash   Family History  Problem Relation Age of Onset  . Stroke Paternal Grandfather   . Diabetes Brother   . Parkinsonism Maternal Grandfather     Past medical history, social, surgical and family history all reviewed in electronic medical record.  No pertanent information unless stated regarding to the chief complaint.   Review of Systems: No headache, visual changes, nausea, vomiting, diarrhea, constipation, dizziness, abdominal pain, skin rash, fevers, chills, night sweats, , swollen lymph nodes, body aches, joint swelling,  chest  pain, shortness of breath, mood changes.  Positive muscle aches positive weight loss   Objective  Blood pressure 120/80, pulse 85, height 5' 8.5" (1.74 m), weight 159 lb (72.1 kg), SpO2 98 %.  Systems examined below as of 01/01/17 General: NAD A&O x3 mood, affect normal  HEENT: Pupils equal, extraocular movements intact no nystagmus Respiratory: not short of breath at rest or with speaking Cardiovascular: No lower extremity edema, non tender Skin: Warm dry intact with no signs of infection or rash on extremities or on axial skeleton. Abdomen: Soft nontender, no masses Neuro: Cranial nerves  intact, neurovascularly intact in all extremities with 2+ DTRs and 2+ pulses. Lymph: No lymphadenopathy appreciated today  Gait antalgic gait MSK: Non tender with full range of motion and good stability and symmetric strength and tone of shoulders, elbows, wrist,  knee hips and ankles bilaterally.    Back Exam:  Inspection: Unremarkable  Motion: Flexion 25 deg, Extension 25 deg, Side Bending to 25 deg bilaterally,  Rotation to 35 deg bilaterally  SLR laying: Positive left XSLR laying: Negative  Palpable tenderness: Tender to palpation mostly over the left sacroiliac joint and L4-L5 paraspinal musculature.Marland Kitchen FABER: Unable to do secondary to pain. Sensory change: Gross sensation intact to all lumbar and sacral dermatomes.  Reflexes: 2+ at both patellar tendons, 2+ at achilles tendons, Babinski's downgoing.  Strength at foot  Unable to do strength testing secondary to pain. No true weakness. .    Impression and Recommendations:  This case required medical decision making of moderate complexity.      Note: This dictation was prepared with Dragon dictation along with smaller phrase technology. Any transcriptional errors that result from this process are unintentional.

## 2017-01-03 ENCOUNTER — Ambulatory Visit: Payer: 59 | Admitting: Family Medicine

## 2017-01-07 NOTE — Progress Notes (Signed)
[name] [address] [place], [place] [address] Phone: [phone] Subjective:    CC: Low back pain f/u  [id number]  Jim [name] is a 51 y.o. male coming in with complaint of low back pain. Patient past medical history significant for diabetes. Patient found to have more of a lumbar radiculopathy. Patient is failed conservative therapy and continues to have radicular symptoms. Patient states Doing relatively well at this time. Patient states that the back is feeling better. Still some intermittent radicular symptoms but no weakness. Patient thinks overall seems to be doing relatively well. Still some discomfort   Wt Readings from Last 3 Encounters:  01/08/17 148 lb (67.1 kg)  01/01/17 159 lb (72.1 kg)  11/29/16 156 lb (70.8 kg)       x-rays were ordered, reviewed and independently visualized by me. X-ray 04/13/2016 show no significant bony abnormality.  Past Medical History:  Diagnosis Date  . Diabetes mellitus 2012  . GERD (gastroesophageal reflux disease)    Past Surgical History:  Procedure Laterality Date  . CLOSED REDUCTION ANKLE FRACTURE     Social History   Social History  . Marital status: Married    Spouse name: N/A  . Number of children: N/A  . Years of education: N/A   Social History Main Topics  . Smoking status: Never Smoker  . Smokeless tobacco: Never Used  . Alcohol use Yes     Comment: ocassionally  . Drug use: No  . Sexual activity: Not Asked   Other Topics Concern  . None   Social History Narrative   Works for [employer]   . Lives in [place]. 2 children, daughter 15YO and son 11YO   Allergies  Allergen Reactions  . Ciprofloxacin Rash   Family History  Problem Relation Age of Onset  . Stroke Paternal Grandfather   . Diabetes Brother   . Parkinsonism Maternal Grandfather     Past medical history, social, surgical and family history all reviewed in electronic medical record.  No  pertanent information unless stated regarding to the chief complaint.   Review of Systems: No headache, visual changes, nausea, vomiting, diarrhea, constipation, dizziness, abdominal pain, skin rash, fevers, chills, night sweats, weight loss, swollen lymph nodes, body aches, joint swelling, muscle aches, chest pain, shortness of breath, mood changes.     Objective  Blood pressure 114/80, pulse 91, height 5' 8.5" (1.74 m), weight 148 lb (67.1 kg), SpO2 96 %.  Systems examined below as of 01/08/17 General: NAD A&O x3 mood, affect normal  HEENT: Pupils equal, extraocular movements intact no nystagmus Respiratory: not short of breath at rest or with speaking Cardiovascular: No lower extremity edema, non tender Skin: Warm dry intact with no signs of infection or rash on extremities or on axial skeleton. Abdomen: Soft nontender, no masses Neuro: Cranial nerves  intact, neurovascularly intact in all extremities with 2+ DTRs and 2+ pulses. Lymph: No lymphadenopathy appreciated today  Gait normal with good balance and coordination.  MSK: Non tender with full range of motion and good stability and symmetric strength and tone of shoulders, elbows, wrist,  knee hips and ankles bilaterally.     Back Exam:  Inspection: Unremarkable  Motion: Flexion 45 deg, Extension 25 deg, Side Bending to 35 deg bilaterally,  Rotation to 35 deg bilaterally  SLR laying: Negative  XSLR laying: Negative  Palpable tenderness:  Severe tenderness to palpation and appears palmar musculature. FABER: negative. Sensory change: Gross sensation intact to  all lumbar and sacral dermatomes.  Reflexes: 2+ at both patellar tendons, 2+ at achilles tendons, Babinski's downgoing.  Strength at foot  Plantar-flexion: 5/5 Dorsi-flexion: 5/5 Eversion: 5/5 Inversion: 5/5  Leg strength  Quad: 5/5 Hamstring: 5/5 Hip flexor: 5/5 Hip abductors: 5/5  Gait unremarkable.  Osteopathic findings C2 flexed rotated and side bent right C4 flexed  rotated and side bent left C7 flexed rotated and side bent left T3 extended rotated and side bent right inhaled third rib T8 extended rotated and side bent left L3 flexed rotated and side bent right Sacrum right on right     Impression and Recommendations:     This case required medical decision making of moderate complexity.      Note: This dictation was prepared with Dragon dictation along with smaller phrase technology. Any transcriptional errors that result from this process are unintentional.

## 2017-01-08 ENCOUNTER — Ambulatory Visit (INDEPENDENT_AMBULATORY_CARE_PROVIDER_SITE_OTHER): Payer: 59 | Admitting: Family Medicine

## 2017-01-08 ENCOUNTER — Encounter: Payer: Self-pay | Admitting: Family Medicine

## 2017-01-08 DIAGNOSIS — M5416 Radiculopathy, lumbar region: Secondary | ICD-10-CM | POA: Diagnosis not present

## 2017-01-08 DIAGNOSIS — M999 Biomechanical lesion, unspecified: Secondary | ICD-10-CM | POA: Diagnosis not present

## 2017-01-08 NOTE — Assessment & Plan Note (Signed)
Lumbar radiculopathy. Patient and send this previously. Doing well with the gabapentin and meloxicam. Patient is going to continue to do conservative therapy. Patient did respond really well to osteopathic manipulation. Patient will follow-up with me again in 2-3 weeks for further evaluation and treatment.

## 2017-01-08 NOTE — Patient Instructions (Signed)
keep me in the loop Ice is your friend Consider MRI  See me again in 2-4 weeks.

## 2017-01-08 NOTE — Assessment & Plan Note (Signed)
Decision today to treat with OMT was based on Physical Exam  After verbal consent patient was treated with HVLA, ME, FPR techniques in cervical, thoracic, lumbar and sacral areas  Patient tolerated the procedure well with improvement in symptoms  Patient given exercises, stretches and lifestyle modifications  See medications in patient instructions if given  Patient will follow up in 4 weeks 

## 2017-01-28 ENCOUNTER — Other Ambulatory Visit: Payer: Self-pay | Admitting: Family Medicine

## 2017-02-06 ENCOUNTER — Encounter: Payer: Self-pay | Admitting: Family Medicine

## 2017-02-06 ENCOUNTER — Ambulatory Visit (INDEPENDENT_AMBULATORY_CARE_PROVIDER_SITE_OTHER): Payer: 59 | Admitting: Family Medicine

## 2017-02-06 VITALS — BP 112/70 | HR 97 | Ht 68.5 in | Wt 150.0 lb

## 2017-02-06 DIAGNOSIS — M5416 Radiculopathy, lumbar region: Secondary | ICD-10-CM

## 2017-02-06 DIAGNOSIS — M999 Biomechanical lesion, unspecified: Secondary | ICD-10-CM

## 2017-02-06 NOTE — Patient Instructions (Signed)
Good to see you  At next lab draw we will check a couple more things.  Stay active Work on range of motion OK to increase activity as tolerated  Keep working on the protein  See me again in 4 weeks.

## 2017-02-06 NOTE — Assessment & Plan Note (Signed)
Still significant tightness of the lower back. Still responding fairly well to osteopathic manipulation. Discussed with him about potential further workup. Inflammatory markers ordered today. Patient will have a strongly follows up with primary care provider. We discussed icing regimen, home exercise, which activities doing which ones to avoid. Patient will come back and see me again in 4 weeks.

## 2017-02-06 NOTE — Progress Notes (Signed)
[name] [place] [place], [place] [address] Phone: [phone] Subjective:    CC: Low back pain f/u  [id number]  Jim [name] is a 51 y.o. male coming in with complaint of low back pain. Patient past medical history significant for diabetes. Patient continued to have significant tightness of the back. Patient states that unfortunately still seems to be more localized. Not going down the legs as much. Patient denies any weakness. Still having difficulty with gaining weight. Has felt a little fatigued. Patient is following up with primary care provider in 2 weeks.   Wt Readings from Last 3 Encounters:  02/06/17 150 lb (68 kg)  01/08/17 148 lb (67.1 kg)  01/01/17 159 lb (72.1 kg)       x-rays were ordered, reviewed and independently visualized by me. X-ray 04/13/2016 show no significant bony abnormality.  Past Medical History:  Diagnosis Date  . Diabetes mellitus 2012  . GERD (gastroesophageal reflux disease)    Past Surgical History:  Procedure Laterality Date  . CLOSED REDUCTION ANKLE FRACTURE     Social History   Social History  . Marital status: Married    Spouse name: N/A  . Number of children: N/A  . Years of education: N/A   Social History Main Topics  . Smoking status: Never Smoker  . Smokeless tobacco: Never Used  . Alcohol use Yes     Comment: ocassionally  . Drug use: No  . Sexual activity: Not Asked   Other Topics Concern  . None   Social History Narrative   Works for [employer]   . Lives in [place]. 2 children, daughter 15YO and son 11YO   Allergies  Allergen Reactions  . Ciprofloxacin Rash   Family History  Problem Relation Age of Onset  . Stroke Paternal Grandfather   . Diabetes Brother   . Parkinsonism Maternal Grandfather     Past medical history, social, surgical and family history all reviewed in electronic medical record.  No pertanent information unless stated regarding to the  chief complaint.   Review of Systems: No headache, visual changes, nausea, vomiting, diarrhea, constipation, dizziness, abdominal pain, skin rash, fevers, chills, night sweats, weight loss, swollen lymph nodes, body aches, joint swelling, chest pain, shortness of breath, mood changes.  Positive muscle aches    Objective  Blood pressure 112/70, pulse 97, height 5' 8.5" (1.74 m), weight 150 lb (68 kg), SpO2 97 %.  Systems examined below as of 02/06/17 General: NAD A&O x3 mood, affect normal  HEENT: Pupils equal, extraocular movements intact no nystagmus Respiratory: not short of breath at rest or with speaking Cardiovascular: No lower extremity edema, non tender Skin: Warm dry intact with no signs of infection or rash on extremities or on axial skeleton. Abdomen: Soft nontender, no masses Neuro: Cranial nerves  intact, neurovascularly intact in all extremities with 2+ DTRs and 2+ pulses. Lymph: No lymphadenopathy appreciated today  Gait normal with good balance and coordination.  MSK: Non tender with full range of motion and good stability and symmetric strength and tone of shoulders, elbows, wrist,  knee hips and ankles bilaterally.    Back Exam:  Inspection: Loss of lordosis Motion: Flexion 45 deg, Extension 45 deg, Side Bending to 45 deg bilaterally,  Rotation to 45 deg bilaterally  SLR laying: Negative  XSLR laying: Negative  Palpable tenderness: Tender to palpation in the paraspinal musculature mostly around the lumbosacral area. FABER: negative. Sensory change: Gross sensation intact to  all lumbar and sacral dermatomes.  Reflexes: 2+ at both patellar tendons, 2+ at achilles tendons, Babinski's downgoing.  Strength at foot  Plantar-flexion: 5/5 Dorsi-flexion: 5/5 Eversion: 5/5 Inversion: 5/5  Leg strength  Quad: 5/5 Hamstring: 5/5 Hip flexor: 5/5 Hip abductors: 4/5  Gait unremarkable.   Osteopathic findings C2 flexed rotated and side bent right C4 flexed rotated and side  bent left T3 extended rotated and side bent right inhaled third rib T8 extended rotated and side bent left L3 flexed rotated and side bent right Sacrum right on right    Impression and Recommendations:     This case required medical decision making of moderate complexity.      Note: This dictation was prepared with Dragon dictation along with smaller phrase technology. Any transcriptional errors that result from this process are unintentional.

## 2017-02-06 NOTE — Assessment & Plan Note (Signed)
Decision today to treat with OMT was based on Physical Exam  After verbal consent patient was treated with HVLA, ME, FPR techniques in cervical, thoracic, lumbar and sacral areas  Patient tolerated the procedure well with improvement in symptoms  Patient given exercises, stretches and lifestyle modifications  See medications in patient instructions if given  Patient will follow up in 4 weeks 

## 2017-02-06 NOTE — Addendum Note (Signed)
Addended by: Douglass Rivers T on: 02/06/2017 04:52 PM   Modules accepted: Orders

## 2017-02-16 ENCOUNTER — Telehealth: Payer: Self-pay | Admitting: Family Medicine

## 2017-02-16 NOTE — Telephone Encounter (Signed)
Pt would like to get labs done. Need orders please and thank you!  Call pt @ [phone].

## 2017-02-19 ENCOUNTER — Other Ambulatory Visit: Payer: Self-pay | Admitting: Family Medicine

## 2017-02-19 DIAGNOSIS — Z130 Encounter for screening for diseases of the blood and blood-forming organs and certain disorders involving the immune mechanism: Secondary | ICD-10-CM

## 2017-02-19 DIAGNOSIS — E785 Hyperlipidemia, unspecified: Secondary | ICD-10-CM

## 2017-02-19 DIAGNOSIS — E119 Type 2 diabetes mellitus without complications: Secondary | ICD-10-CM

## 2017-02-19 NOTE — Telephone Encounter (Signed)
Patient will call to schedule appointment.

## 2017-02-19 NOTE — Telephone Encounter (Signed)
Orders in 

## 2017-02-20 MED FILL — JARDIANCE 10 MG TABLET: 10 | 90 days supply | Qty: 90 | Fill #1

## 2017-02-20 MED FILL — METFORMIN HCL ER 750 MG TAB: 750 | 90 days supply | Qty: 180 | Fill #1

## 2017-02-21 ENCOUNTER — Ambulatory Visit: Payer: 59 | Admitting: Family Medicine

## 2017-03-08 ENCOUNTER — Ambulatory Visit (INDEPENDENT_AMBULATORY_CARE_PROVIDER_SITE_OTHER): Payer: 59 | Admitting: Family

## 2017-03-08 ENCOUNTER — Encounter: Payer: Self-pay | Admitting: Family

## 2017-03-08 VITALS — BP 140/70 | HR 112 | Temp 100.0°F | Ht 68.5 in | Wt 146.1 lb

## 2017-03-08 DIAGNOSIS — H109 Unspecified conjunctivitis: Secondary | ICD-10-CM | POA: Diagnosis not present

## 2017-03-08 DIAGNOSIS — J0110 Acute frontal sinusitis, unspecified: Secondary | ICD-10-CM

## 2017-03-08 MED ORDER — ERYTHROMYCIN 5 MG/GM OP OINT: TOPICAL_OINTMENT | OPHTHALMIC | 0 refills | Status: DC

## 2017-03-08 MED ORDER — DOXYCYCLINE HYCLATE 100 MG PO TABS: 100.0000 mg | ORAL_TABLET | Freq: Two times a day (BID) | ORAL | 0 refills | Status: DC

## 2017-03-08 MED FILL — DOXYCYCLINE HYCLATE 100 MG: 100 | 7 days supply | Qty: 14 | Fill #0

## 2017-03-08 MED FILL — ERYTHROMYCIN EYE OINTMENT: 5 | 7 days supply | Qty: 4 | Fill #0

## 2017-03-08 NOTE — Progress Notes (Signed)
Subjective:    Patient ID: Jim [name], male    DOB: [birth year], 51 y.o.   MRN: [id number]  CC: Jim [name] is a 51 y.o. male who presents today for an acute visit.    HPI: CC: sinus congestion x 2 weeks, waxing and waning.  Taking mucinex, nasal saline spray with some relief . Endorses fever, white discharge from bilateral eyes, sore throat, ears muffled, cough. Feels sob as 'congestion gets in chest.' states bilateral eyes matted shut in mornings. Eyes aren't itchy. Slight redness to left eye.  NO eye pain, vision changes, gritty sensation in eyes, HA, photophobia, foreign body sensation, wheezing.   Doesn't wear eye contacts.   Had had recurrent sinus infections and followed with ENT in the past, decied not to pursue surgery.     HISTORY:  Past Medical History:  Diagnosis Date  . Diabetes mellitus 2012  . GERD (gastroesophageal reflux disease)    Past Surgical History:  Procedure Laterality Date  . CLOSED REDUCTION ANKLE FRACTURE     Family History  Problem Relation Age of Onset  . Stroke Paternal Grandfather   . Diabetes Brother   . Parkinsonism Maternal Grandfather     Allergies: Ciprofloxacin Current Outpatient Prescriptions on File Prior to Visit  Medication Sig Dispense Refill  . acetaminophen (TYLENOL) 500 MG tablet Take 500 mg by mouth every 6 (six) hours as needed.    . empagliflozin (JARDIANCE) 10 MG TABS tablet Take 10 mg by mouth daily. 90 tablet 3  . fluticasone (FLONASE) 50 MCG/ACT nasal spray 2 sprays into each nostril daily. 16 g 6  . gabapentin (NEURONTIN) 300 MG capsule nightly 30 capsule 3  . Glucose Blood (BLOOD GLUCOSE TEST STRIPS) STRP True Result test strips, to test blood glucose 3 times daily, use as directed 100 each 5  . meloxicam (MOBIC) 15 MG tablet Take 1 tablet (15 mg total) by mouth daily. 30 tablet 0  . meloxicam (MOBIC) 15 MG tablet TAKE 1 TABLET BY MOUTH EVERY DAY 30 tablet 0  . metFORMIN (GLUCOPHAGE XR) 750 MG 24 hr tablet  Take 1 tablet (750 mg total) by mouth 2 (two) times daily. 180 tablet 1  . sildenafil (VIAGRA) 100 MG tablet Take 1 tablet (100 mg total) by mouth daily as needed for erectile dysfunction. 3 tablet 11  . TRUEPLUS LANCETS 30G MISC USE AS DIRECTED 100 each 11   No current facility-administered medications on file prior to visit.     Social History  Substance Use Topics  . Smoking status: Never Smoker  . Smokeless tobacco: Never Used  . Alcohol use Yes     Comment: ocassionally    Review of Systems  Constitutional: Positive for fever. Negative for chills.  HENT: Positive for congestion, ear pain, rhinorrhea, sinus pressure and sore throat.   Eyes: Positive for discharge and redness. Negative for photophobia, pain, itching and visual disturbance.  Respiratory: Positive for cough. Negative for wheezing.   Cardiovascular: Negative for chest pain, palpitations and leg swelling.  Gastrointestinal: Negative for diarrhea, nausea and vomiting.  Genitourinary: Negative for dysuria.  Musculoskeletal: Negative for myalgias.  Skin: Negative for rash.  Neurological: Negative for headaches.  Hematological: Negative for adenopathy.      Objective:    There were no vitals taken for this visit.   Physical Exam  Constitutional: He appears well-developed and well-nourished.  HENT:  Head: Normocephalic and atraumatic.  Right Ear: Hearing, tympanic membrane, external ear and ear canal normal. No drainage,  swelling or tenderness. Tympanic membrane is not injected, not erythematous and not bulging. No middle ear effusion. No decreased hearing is noted.  Left Ear: Hearing, tympanic membrane, external ear and ear canal normal. No drainage, swelling or tenderness. Tympanic membrane is not injected, not erythematous and not bulging.  No middle ear effusion. No decreased hearing is noted.  Nose: Right sinus exhibits frontal sinus tenderness. Right sinus exhibits no maxillary sinus tenderness. Left sinus  exhibits frontal sinus tenderness. Left sinus exhibits no maxillary sinus tenderness.  Mouth/Throat: Uvula is midline, oropharynx is clear and moist and mucous membranes are normal. No oropharyngeal exudate, posterior oropharyngeal edema, posterior oropharyngeal erythema or tonsillar abscesses.  Eyes: Pupils are equal, round, and reactive to light. EOM and lids are normal. Lids are everted and swept, no foreign bodies found. Right eye exhibits no discharge, no exudate and no hordeolum. No foreign body present in the right eye. Left eye exhibits no discharge, no exudate and no hordeolum. No foreign body present in the left eye. Right conjunctiva is injected. Right conjunctiva has no hemorrhage. Left conjunctiva is injected. Left conjunctiva has no hemorrhage.  No external eye lesions. Surrounding skin intact.   Bilateral eyes:   Mild injection of the conjunctiva. No white spots, opacity, or foreign body appreciated. No collection of blood or pus in the anterior chamber. No ciliary flush surrounding iris.   No photophobia or eye pain appreciated during exam.     Cardiovascular: Regular rhythm and normal heart sounds.   Pulmonary/Chest: Effort normal and breath sounds normal. No respiratory distress. He has no wheezes. He has no rhonchi. He has no rales.  Lymphadenopathy:       Head (right side): No submental, no submandibular, no tonsillar, no preauricular, no posterior auricular and no occipital adenopathy present.       Head (left side): No submental, no submandibular, no tonsillar, no preauricular, no posterior auricular and no occipital adenopathy present.    He has no cervical adenopathy.       Right cervical: No superficial cervical, no deep cervical and no posterior cervical adenopathy present.      Left cervical: No superficial cervical, no deep cervical and no posterior cervical adenopathy present.  Neurological: He is alert.  Skin: Skin is warm and dry.  Psychiatric: He has a normal  mood and affect. His speech is normal and behavior is normal.  Vitals reviewed.      Assessment & Plan:   1. Acute non-recurrent frontal sinusitis Temperature 100 today. Patient is well-appearing however based on duration of symptoms, we jointly agreed to start antibiotic therapy today. Return precautions given. - doxycycline (VIBRA-TABS) 100 MG tablet; Take 1 tablet (100 mg total) by mouth 2 (two) times daily.  Dispense: 14 tablet; Refill: 0  2. Bacterial conjunctivitis of both eyes Based on discharge and eyes being 'matted shut', will treat empirically for bacterial conjunctivitis. Return precautions given. - erythromycin ophthalmic ointment; Use one half inch four times daily to affected eye (s) x 7 days.  Dispense: 3.5 g; Refill: 0    I am having Mr. Luckey maintain his acetaminophen, fluticasone, sildenafil, gabapentin, metFORMIN, TRUEPLUS LANCETS 30G, empagliflozin, BLOOD GLUCOSE TEST STRIPS, meloxicam, and meloxicam.   No orders of the defined types were placed in this encounter.   Return precautions given.   Risks, benefits, and alternatives of the medications and treatment plan prescribed today were discussed, and patient expressed understanding.   Education regarding symptom management and diagnosis given to patient on AVS.  Continue to follow with Coral Spikes, DO for routine health maintenance.   Jim [name] and I agreed with plan.   Mable Paris, FNP

## 2017-03-08 NOTE — Progress Notes (Signed)
Pre visit review using our clinic review tool, if applicable. No additional management support is needed unless otherwise documented below in the visit note. 

## 2017-03-08 NOTE — Patient Instructions (Signed)
Start doxycycline as discussed. We'll also treat empirically for bacterial conjunctivitis. Continue Mucinex Plenty of water Let us know if you're not better

## 2017-04-11 ENCOUNTER — Telehealth: Payer: Self-pay | Admitting: Family

## 2017-04-11 DIAGNOSIS — E119 Type 2 diabetes mellitus without complications: Secondary | ICD-10-CM

## 2017-04-11 NOTE — Telephone Encounter (Signed)
Pt called wanting to get labs done before his appt. Need orders. Thank you!

## 2017-04-11 NOTE — Telephone Encounter (Signed)
Patient has been informed.

## 2017-04-11 NOTE — Telephone Encounter (Signed)
Ordered  Let pt know These are non fasting

## 2017-04-11 NOTE — Telephone Encounter (Signed)
Please advise 

## 2017-04-16 ENCOUNTER — Other Ambulatory Visit (INDEPENDENT_AMBULATORY_CARE_PROVIDER_SITE_OTHER): Payer: 59

## 2017-04-16 DIAGNOSIS — Z130 Encounter for screening for diseases of the blood and blood-forming organs and certain disorders involving the immune mechanism: Secondary | ICD-10-CM | POA: Diagnosis not present

## 2017-04-16 DIAGNOSIS — E119 Type 2 diabetes mellitus without complications: Secondary | ICD-10-CM

## 2017-04-16 DIAGNOSIS — E785 Hyperlipidemia, unspecified: Secondary | ICD-10-CM

## 2017-04-16 LAB — COMPREHENSIVE METABOLIC PANEL
ALT: 22 U/L (ref 0–53)
AST: 14 U/L (ref 0–37)
Albumin: 4.4 g/dL (ref 3.5–5.2)
Alkaline Phosphatase: 63 U/L (ref 39–117)
BUN: 19 mg/dL (ref 6–23)
CO2: 24 meq/L (ref 19–32)
Calcium: 9.8 mg/dL (ref 8.4–10.5)
Chloride: 102 mEq/L (ref 96–112)
Creatinine, Ser: 0.76 mg/dL (ref 0.40–1.50)
GFR: 115.69 mL/min (ref >60.00)
GLUCOSE: 170 mg/dL — AB (ref 70–99)
POTASSIUM: 4.2 meq/L (ref 3.5–5.1)
SODIUM: 139 meq/L (ref 135–145)
Total Bilirubin: 0.5 mg/dL (ref 0.2–1.2)
Total Protein: 6.4 g/dL (ref 6.0–8.3)

## 2017-04-16 LAB — LIPID PANEL
CHOL/HDL RATIO: 4
Cholesterol: 160 mg/dL (ref 0–200)
HDL: 39 mg/dL — AB (ref >39.00)
LDL Cholesterol: 94 mg/dL (ref 0–99)
NONHDL: 120.88
Triglycerides: 135 mg/dL (ref 0.0–149.0)
VLDL: 27 mg/dL (ref 0.0–40.0)

## 2017-04-16 LAB — CBC
HEMATOCRIT: 46.3 % (ref 39.0–52.0)
HEMOGLOBIN: 15.4 g/dL (ref 13.0–17.0)
MCHC: 33.2 g/dL (ref 30.0–36.0)
MCV: 86.7 fl (ref 78.0–100.0)
PLATELETS: 329 10*3/uL (ref 150.0–400.0)
RBC: 5.34 Mil/uL (ref 4.22–5.81)
RDW: 14.1 % (ref 11.5–15.5)
WBC: 8.4 10*3/uL (ref 4.0–10.5)

## 2017-04-16 LAB — HEMOGLOBIN A1C: Hgb A1c MFr Bld: 8.9 % — ABNORMAL HIGH (ref 4.6–6.5)

## 2017-04-18 ENCOUNTER — Encounter: Payer: Self-pay | Admitting: Family

## 2017-04-18 ENCOUNTER — Ambulatory Visit (INDEPENDENT_AMBULATORY_CARE_PROVIDER_SITE_OTHER): Payer: 59 | Admitting: Family

## 2017-04-18 VITALS — BP 106/72 | HR 89 | Temp 98.4°F | Ht 68.5 in | Wt 147.4 lb

## 2017-04-18 DIAGNOSIS — E119 Type 2 diabetes mellitus without complications: Secondary | ICD-10-CM | POA: Diagnosis not present

## 2017-04-18 MED ORDER — GLIPIZIDE 10 MG PO TABS: 10.0000 mg | ORAL_TABLET | Freq: Two times a day (BID) | ORAL | 3 refills | Status: DC

## 2017-04-18 NOTE — Patient Instructions (Addendum)
Stop jardiance  Start glipizide again.   Consult with endocrine    This is  Dr. Lupita Dawn  example of a  "Low GI"  Diet:  It will allow you to lose 4 to 8  lbs  per month if you follow it carefully.  Your goal with exercise is a minimum of 30 minutes of aerobic exercise 5 days per week (Walking does not count once it becomes easy!)    All of the foods can be found at grocery stores and in bulk at [employer].  The Atkins protein bars and shakes are available in more varieties at Target, [employer] and [place].     7 AM Breakfast:  Choose from the following:  Low carbohydrate Protein  Shakes (I recommend the  Premier Protein chocolate shakes,  EAS AdvantEdge "Carb Control" shakes  Or the Atkins shakes all are under 3 net carbs)     a scrambled egg/bacon/cheese burrito made with Mission's "carb balance" whole wheat tortilla  (about 10 net carbs )  Regulatory affairs officer (basically a quiche without the pastry crust) that is eaten cold and very convenient way to get your eggs.  8 carbs)  If you make your own protein shakes, avoid bananas and pineapple,  And use low carb greek yogurt or original /unsweetened almond or soy milk    Avoid cereal and bananas, oatmeal and cream of wheat and grits. They are loaded with carbohydrates!   10 AM: high protein snack:  Protein bar by Atkins (the snack size, under 200 cal, usually < 6 net carbs).    A stick of cheese:  Around 1 carb,  100 cal     Dannon Light n Fit [place] Yogurt  (80 cal, 8 carbs)  Other so called "protein bars" and Greek yogurts tend to be loaded with carbohydrates.  Remember, in food advertising, the word "energy" is synonymous for " carbohydrate."  Lunch:   A Sandwich using the bread choices listed, Can use any  Eggs,  lunchmeat, grilled meat or canned tuna), avocado, regular mayo/mustard  and cheese.  A Salad using blue cheese, ranch,  Goddess or vinagrette,  Avoid taco shells, croutons or "confetti" and no "candied  nuts" but regular nuts OK.   No pretzels, nabs  or chips.  Pickles and miniature sweet peppers are a good low carb alternative that provide a "crunch"  The bread is the only source of carbohydrate in a sandwich and  can be decreased by trying some of the attached alternatives to traditional loaf bread   Avoid "Low fat dressings, as well as Perrin dressings They are loaded with sugar!   3 PM/ Mid day  Snack:  Consider  1 ounce of  almonds, walnuts, pistachios, pecans, peanuts,  Macadamia nuts or a nut medley.  Avoid "granola and granola bars "  Mixed nuts are ok in moderation as long as there are no raisins,  cranberries or dried fruit.   KIND bars are OK if you get the low glycemic index variety   Try the prosciutto/mozzarella cheese sticks by [employer]  In deli /backery section   High protein      6 PM  Dinner:     Meat/fowl/fish with a green salad, and either broccoli, cauliflower, green beans, spinach, brussel sprouts or  Lima beans. DO NOT BREAD THE PROTEIN!!      There is a low carb pasta by Dreamfield's that is acceptable and tastes great: only 5 digestible  carbs/serving.( All grocery stores but BJs carry it ) Several ready made meals are available low carb:   Try Michel Angelo's chicken piccata or chicken or eggplant parm over low carb pasta.(Lowes and BJs)   Marjory Lies Sanchez's "Carnitas" (pulled pork, no sauce,  0 carbs) or his beef pot roast to make a dinner burrito (at [employer]J's)  Pesto over low carb pasta (bj's sells a good quality pesto in the center refrigerated section of the deli   Try satueeing  Cheral Marker with mushroooms as a good side   [employer] Giant makes a mashed cauliflower that tastes like mashed potatoes  Whole wheat pasta is still full of digestible carbs and  Not as low in glycemic index as Dreamfield's.   Brown rice is still rice,  So skip the rice and noodles if you eat [place] or [place] (or at least limit to 1/2 cup)  9 PM snack :   Breyer's "low carb"  fudgsicle or  ice cream bar (Carb Smart line), or  Weight Watcher's ice cream bar , or another "no sugar added" ice cream;  a serving of fresh berries/cherries with whipped cream   Cheese or DANNON'S LlGHT N FIT GREEK YOGURT  8 ounces of [place] unsweetened almond/cococunut milk    Treat yourself to a parfait made with whipped cream blueberiies, walnuts and vanilla greek yogurt  Avoid bananas, pineapple, grapes  and watermelon on a regular basis because they are high in sugar.  THINK OF THEM AS DESSERT  Remember that snack Substitutions should be less than 10 NET carbs per serving and meals < 20 carbs. Remember to subtract fiber grams to get the "net carbs."  @TULLOBREADPACKAGE @

## 2017-04-18 NOTE — Progress Notes (Signed)
Subjective:    Patient ID: Jim [name], male    DOB: 05-24-1966, 51 y.o.   MRN: [id number]  CC: [name] is a 51 y.o. male who presents today for follow up.   HPI: DM- last a1c 8.9 States has been loosing weight since starting jardiance 11/2016. Recently stopped glipizide. Has also noticed increased thirst. No night sweats, bone pain. 'maybe a little lower energy.' Eating more lately. No increased exercise.  FBG- 130-160  Weight of 160 08/2016, 157 11/2016, and then today 146.       HISTORY:  Past Medical History:  Diagnosis Date  . Diabetes mellitus 2012  . GERD (gastroesophageal reflux disease)    Past Surgical History:  Procedure Laterality Date  . CLOSED REDUCTION ANKLE FRACTURE     Family History  Problem Relation Age of Onset  . Stroke Paternal Grandfather   . Diabetes Brother   . Parkinsonism Maternal Grandfather     Allergies: Ciprofloxacin Current Outpatient Prescriptions on File Prior to Visit  Medication Sig Dispense Refill  . acetaminophen (TYLENOL) 500 MG tablet Take 500 mg by mouth every 6 (six) hours as needed.    . doxycycline (VIBRA-TABS) 100 MG tablet Take 1 tablet (100 mg total) by mouth 2 (two) times daily. 14 tablet 0  . erythromycin ophthalmic ointment Use one half inch four times daily to affected eye (s) x 7 days. 3.5 g 0  . fluticasone (FLONASE) 50 MCG/ACT nasal spray 2 sprays into each nostril daily. 16 g 6  . gabapentin (NEURONTIN) 300 MG capsule nightly 30 capsule 3  . Glucose Blood (BLOOD GLUCOSE TEST STRIPS) STRP True Result test strips, to test blood glucose 3 times daily, use as directed 100 each 5  . meloxicam (MOBIC) 15 MG tablet Take 1 tablet (15 mg total) by mouth daily. 30 tablet 0  . meloxicam (MOBIC) 15 MG tablet TAKE 1 TABLET BY MOUTH EVERY DAY 30 tablet 0  . metFORMIN (GLUCOPHAGE XR) 750 MG 24 hr tablet Take 1 tablet (750 mg total) by mouth 2 (two) times daily. 180 tablet 1  . sildenafil (VIAGRA) 100 MG tablet Take 1  tablet (100 mg total) by mouth daily as needed for erectile dysfunction. 3 tablet 11  . TRUEPLUS LANCETS 30G MISC USE AS DIRECTED 100 each 11   No current facility-administered medications on file prior to visit.     Social History  Substance Use Topics  . Smoking status: Never Smoker  . Smokeless tobacco: Never Used  . Alcohol use Yes     Comment: ocassionally    Review of Systems  Constitutional: Negative for chills and fever.  Respiratory: Negative for cough.   Cardiovascular: Negative for chest pain and palpitations.  Gastrointestinal: Negative for nausea and vomiting.      Objective:    BP 106/72   Pulse 89   Temp 98.4 F (36.9 C) (Oral)   Ht 5' 8.5" (1.74 m)   Wt 147 lb 6.4 oz (66.9 kg)   SpO2 98%   BMI 22.09 kg/m  BP Readings from Last 3 Encounters:  04/18/17 106/72  03/08/17 140/70  02/06/17 112/70   Wt Readings from Last 3 Encounters:  04/18/17 147 lb 6.4 oz (66.9 kg)  03/08/17 146 lb 1.9 oz (66.3 kg)  02/06/17 150 lb (68 kg)    Physical Exam  Constitutional: He appears well-developed and well-nourished.  Cardiovascular: Regular rhythm and normal heart sounds.   Pulmonary/Chest: Effort normal and breath sounds normal. No respiratory distress.  He has no wheezes. He has no rhonchi. He has no rales.  Neurological: He is alert.  Skin: Skin is warm and dry.  Psychiatric: He has a normal mood and affect. His speech is normal and behavior is normal.  Vitals reviewed.      Assessment & Plan:   Problem List Items Addressed This Visit      Endocrine   DM (diabetes mellitus), type 2 ([place]) - Primary    a1c 8.9. 16 pound unintentional weight loss. referral to endocrine for formal evaluation for DM I versus DM II; concerned as patient continues to loose weight and doesn't have expected body habitus of DM II. Advised to stop jardiance for now and start back on glipizide to see if this effects weight. Will follow.       Relevant Medications   glipiZIDE  (GLUCOTROL) 10 MG tablet   Other Relevant Orders   Ambulatory referral to Endocrinology       I have discontinued Mr. Stief empagliflozin. I am also having him maintain his acetaminophen, fluticasone, sildenafil, gabapentin, metFORMIN, TRUEPLUS LANCETS 30G, BLOOD GLUCOSE TEST STRIPS, meloxicam, meloxicam, doxycycline, erythromycin, and glipiZIDE.   Meds ordered this encounter  Medications  . glipiZIDE (GLUCOTROL) 10 MG tablet    Sig: Take 1 tablet (10 mg total) by mouth 2 (two) times daily before a meal.    Dispense:  180 tablet    Refill:  3    Order Specific Question:   Supervising Provider    Answer:   Crecencio Mc [[id number]]    Return precautions given.   Risks, benefits, and alternatives of the medications and treatment plan prescribed today were discussed, and patient expressed understanding.   Education regarding symptom management and diagnosis given to patient on AVS.  Continue to follow with Burnard Hawthorne, FNP for routine health maintenance.   Jim [name] and I agreed with plan.   Mable Paris, FNP

## 2017-04-18 NOTE — Progress Notes (Signed)
Pre visit review using our clinic review tool, if applicable. No additional management support is needed unless otherwise documented below in the visit note. 

## 2017-04-18 NOTE — Assessment & Plan Note (Signed)
a1c 8.9. 16 pound unintentional weight loss. referral to endocrine for formal evaluation for DM I versus DM II; concerned as patient continues to loose weight and doesn't have expected body habitus of DM II. Advised to stop jardiance for now and start back on glipizide to see if this effects weight. Will follow.

## 2017-05-11 ENCOUNTER — Ambulatory Visit: Payer: Self-pay

## 2017-05-11 ENCOUNTER — Encounter: Payer: Self-pay | Admitting: Family Medicine

## 2017-05-11 ENCOUNTER — Ambulatory Visit (INDEPENDENT_AMBULATORY_CARE_PROVIDER_SITE_OTHER): Payer: 59 | Admitting: Family Medicine

## 2017-05-11 VITALS — BP 110/78 | HR 97 | Ht 68.0 in | Wt 152.0 lb

## 2017-05-11 DIAGNOSIS — M999 Biomechanical lesion, unspecified: Secondary | ICD-10-CM | POA: Diagnosis not present

## 2017-05-11 DIAGNOSIS — M79671 Pain in right foot: Secondary | ICD-10-CM | POA: Diagnosis not present

## 2017-05-11 DIAGNOSIS — M5416 Radiculopathy, lumbar region: Secondary | ICD-10-CM | POA: Diagnosis not present

## 2017-05-11 DIAGNOSIS — M84374A Stress fracture, right foot, initial encounter for fracture: Secondary | ICD-10-CM | POA: Insufficient documentation

## 2017-05-11 DIAGNOSIS — E1165 Type 2 diabetes mellitus with hyperglycemia: Secondary | ICD-10-CM | POA: Diagnosis not present

## 2017-05-11 MED ORDER — VITAMIN D (ERGOCALCIFEROL) 1.25 MG (50000 UNIT) PO CAPS: 50000.0000 [IU] | ORAL_CAPSULE | ORAL | 0 refills | Status: DC

## 2017-05-11 MED FILL — VIT D2 1.25 MG (50,000 UNIT: 1.25 MG | 84 days supply | Qty: 12 | Fill #0

## 2017-05-11 NOTE — Assessment & Plan Note (Signed)
Stable at the moment. Responding very well to conservative therapy. We discussed icing regimen and home exercises. Discussed the importance of core strengthening. We discussed proper shoe choices. Follow-up again in 6 weeks

## 2017-05-11 NOTE — Progress Notes (Signed)
[name] [address], [place] [address] Phone: [phone] Subjective:     CC: Back pain follow-up  [id number]  Jim [name] is a 50 y.o. male coming in with complaint of neck pain follow-up. Found to have more of a lumbar radiculopathy but has responded fairly well to conservative therapy. Patient recently was found to have significant worsening of his diabetes. A1c of 8.9. Has had weight loss. Has been referred to endocrinology but has not seen anyone yet.  Today he has right foot pain. 4 weeks ago he felt pain in his foot. For a few days he couldn't walk on it. It eventually got better. He says in the morning his foot tightens up and pop on the lateral side. Pain is worse in the mornings. He's tried ice and heat that didn't help. He's used heel gels that didn't help the pain.Patient states this seems to be worse after sitting for long amount time of the first steps in the morning. Patient states though that most the pain is on the dorsal aspect of the foot. No numbness associated with it.       Past Medical History:  Diagnosis Date  . Diabetes mellitus 2012  . GERD (gastroesophageal reflux disease)    Past Surgical History:  Procedure Laterality Date  . CLOSED REDUCTION ANKLE FRACTURE     Social History   Social History  . Marital status: Married    Spouse name: N/A  . Number of children: N/A  . Years of education: N/A   Social History Main Topics  . Smoking status: Never Smoker  . Smokeless tobacco: Never Used  . Alcohol use Yes     Comment: ocassionally  . Drug use: No  . Sexual activity: Not Asked   Other Topics Concern  . None   Social History Narrative   Works for [employer]   . Lives in [place]. 2 children, daughter 15YO and son 11YO   Allergies  Allergen Reactions  . Ciprofloxacin Rash   Family History  Problem Relation Age of Onset  . Stroke Paternal Grandfather   . Diabetes Brother   .  Parkinsonism Maternal Grandfather      Past medical history, social, surgical and family history all reviewed in electronic medical record.  No pertanent information unless stated regarding to the chief complaint.   Review of Systems:Review of systems updated and as accurate as of 05/11/17  No headache, visual changes, nausea, vomiting, diarrhea, constipation, dizziness, abdominal pain, skin rash, fevers, chills, night sweats, weight loss, swollen lymph nodes, body aches, joint swelling, muscle aches, chest pain, shortness of breath, mood changes.   Objective  Blood pressure 110/78, pulse 97, height 5\' 8"  (1.727 m), weight 152 lb (68.9 kg), SpO2 98 %. Systems examined below as of 05/11/17   General: No apparent distress alert and oriented x3 mood and affect normal, dressed appropriately.  HEENT: Pupils equal, extraocular movements intact  Respiratory: Patient's speak in full sentences and does not appear short of breath  Cardiovascular: No lower extremity edema, non tender, no erythema  Skin: Warm dry intact with no signs of infection or rash on extremities or on axial skeleton.  Abdomen: Soft nontender  Neuro: Cranial nerves II through XII are intact, neurovascularly intact in all extremities with 2+ DTRs and 2+ pulses.  Lymph: No lymphadenopathy of posterior or anterior cervical chain or axillae bilaterally.  Gait normal with good balance and coordination.  MSK:  Non tender  with full range of motion and good stability and symmetric strength and tone of shoulders, elbows, wrist, hip, knee and ankles bilaterally.  Back Exam:  Inspection: Mild loss of lordosis Motion: Flexion 45 deg, Extension 25 deg, Side Bending to 45 deg bilaterally,  Rotation to 45 deg bilaterally  SLR laying: Negative  XSLR laying: Negative  Palpable tenderness: Tender to palpation of the paraspinal musculature lumbar spine right greater than left. FABER: Mild tightness bilaterally. Sensory change: Gross sensation  intact to all lumbar and sacral dermatomes.  Reflexes: 2+ at both patellar tendons, 2+ at achilles tendons, Babinski's downgoing.  Strength at foot  Plantar-flexion: 5/5 Dorsi-flexion: 5/5 Eversion: 5/5 Inversion: 5/5  Leg strength  Quad: 5/5 Hamstring: 5/5 Hip flexor: 5/5 Hip abductors: 5/5  Gait unremarkable.     Impression and Recommendations:     This case required medical decision making of moderate complexity.      Note: This dictation was prepared with Dragon dictation along with smaller phrase technology. Any transcriptional errors that result from this process are unintentional.

## 2017-05-11 NOTE — Patient Instructions (Signed)
Good to see you  Ice is your friend pennsaid pinkie amount topically 2 times daily as needed.   Once weekly vitamin D for 12 weeks.  Good shoes with rigid bottom.  Jalene Mullet, [place] or New balance greater then 700 Avoid being barefoot.  See me again in 6 weeks.

## 2017-05-11 NOTE — Assessment & Plan Note (Signed)
Fourth metatarsal proximal. Discussed icing regimen. We discussed home exercise. We discussed which activities doing which ones to avoid. We discussed proper shoes. Once weekly vitamin D prescribed. Follow-up again in 6 weeks

## 2017-05-11 NOTE — Assessment & Plan Note (Signed)
Decision today to treat with OMT was based on Physical Exam  After verbal consent patient was treated with HVLA, ME, FPR techniques in cervical, thoracic, lumbar and sacral areas  Patient tolerated the procedure well with improvement in symptoms  Patient given exercises, stretches and lifestyle modifications  See medications in patient instructions if given  Patient will follow up in 6 weeks 

## 2017-05-30 ENCOUNTER — Telehealth: Payer: Self-pay | Admitting: Family

## 2017-05-30 ENCOUNTER — Other Ambulatory Visit: Payer: Self-pay | Admitting: Family Medicine

## 2017-05-30 MED ORDER — METFORMIN HCL ER 750 MG PO TB24: 750.0000 mg | ORAL_TABLET | Freq: Two times a day (BID) | ORAL | 1 refills | Status: DC

## 2017-05-30 MED FILL — glipiZIDE 10 MG TABS: 10 | 90 days supply | Qty: 180 | Fill #1

## 2017-05-30 MED FILL — METFORMIN HCL ER 750 MG TAB: 750 | 90 days supply | Qty: 180 | Fill #0

## 2017-05-30 NOTE — Telephone Encounter (Signed)
Medication has been refilled.

## 2017-05-30 NOTE — Telephone Encounter (Signed)
Copied from [place] [phone]. Topic: General - Other >> May 30, 2017 11:32 AM Darl Householder, RMA wrote: Reason for CRM: patient is requesting refill on Metformin 750 mg, please send to [facility]

## 2017-05-30 NOTE — Telephone Encounter (Signed)
Please advise 

## 2017-06-20 ENCOUNTER — Ambulatory Visit (INDEPENDENT_AMBULATORY_CARE_PROVIDER_SITE_OTHER): Payer: 59 | Admitting: Internal Medicine

## 2017-06-20 ENCOUNTER — Encounter: Payer: Self-pay | Admitting: Internal Medicine

## 2017-06-20 VITALS — BP 118/78 | HR 94 | Wt 157.4 lb

## 2017-06-20 DIAGNOSIS — E119 Type 2 diabetes mellitus without complications: Secondary | ICD-10-CM

## 2017-06-20 DIAGNOSIS — E1165 Type 2 diabetes mellitus with hyperglycemia: Secondary | ICD-10-CM | POA: Diagnosis not present

## 2017-06-20 LAB — GLUCOSE, POCT (MANUAL RESULT ENTRY): POC Glucose: 299 mg/dl — AB (ref 70–99)

## 2017-06-20 MED ORDER — SITAGLIPTIN PHOSPHATE 100 MG PO TABS
100.0000 mg | ORAL_TABLET | Freq: Every day | ORAL | 5 refills | Status: DC
Start: 2017-06-20 — End: 2017-08-31

## 2017-06-20 MED ORDER — GLIPIZIDE 10 MG PO TABS: 5.0000 mg | ORAL_TABLET | Freq: Two times a day (BID) | ORAL | 3 refills | Status: DC

## 2017-06-20 MED FILL — JANUVIA 100 MG TABLET: 100 | 30 days supply | Qty: 30 | Fill #0

## 2017-06-20 NOTE — Addendum Note (Signed)
Addended by: Drucilla Schmidt on: 06/20/2017 09:05 AM   Modules accepted: Orders

## 2017-06-20 NOTE — Patient Instructions (Signed)
Please continue: - Metformin ER 750 mg 2x a day with meals  Please decrease: - Glipizide to 5 mg and move this 30 min before b'fast and dinner  Please add: - Januvia 100 mg daily 30 min before b'fast  Please let me know if the sugars are consistently <80 or >200.  Please return in 1.5 months with your sugar log.   PATIENT INSTRUCTIONS FOR TYPE 2 DIABETES:  DIET AND EXERCISE Diet and exercise is an important part of diabetic treatment.  We recommended aerobic exercise in the form of brisk walking (working between 40-60% of maximal aerobic capacity, similar to brisk walking) for 150 minutes per week (such as 30 minutes five days per week) along with 3 times per week performing 'resistance' training (using various gauge rubber tubes with handles) 5-10 exercises involving the major muscle groups (upper body, lower body and core) performing 10-15 repetitions (or near fatigue) each exercise. Start at half the above goal but build slowly to reach the above goals. If limited by weight, joint pain, or disability, we recommend daily walking in a swimming pool with water up to waist to reduce pressure from joints while allow for adequate exercise.    BLOOD GLUCOSES Monitoring your blood glucoses is important for continued management of your diabetes. Please check your blood glucoses 2-4 times a day: fasting, before meals and at bedtime (you can rotate these measurements - e.g. one day check before the 3 meals, the next day check before 2 of the meals and before bedtime, etc.).   HYPOGLYCEMIA (low blood sugar) Hypoglycemia is usually a reaction to not eating, exercising, or taking too much insulin/ other diabetes drugs.  Symptoms include tremors, sweating, hunger, confusion, headache, etc. Treat IMMEDIATELY with 15 grams of Carbs: . 4 glucose tablets .  cup regular juice/soda . 2 tablespoons raisins . 4 teaspoons sugar . 1 tablespoon honey Recheck blood glucose in 15 mins and repeat above if  still symptomatic/blood glucose <100.  RECOMMENDATIONS TO REDUCE YOUR RISK OF DIABETIC COMPLICATIONS: * Take your prescribed MEDICATION(S) * Follow a DIABETIC diet: Complex carbs, fiber rich foods, (monounsaturated and polyunsaturated) fats * AVOID saturated/trans fats, high fat foods, >2,300 mg salt per day. * EXERCISE at least 5 times a week for 30 minutes or preferably daily.  * DO NOT SMOKE OR DRINK more than 1 drink a day. * Check your FEET every day. Do not wear tightfitting shoes. Contact us if you develop an ulcer * See your EYE doctor once a year or more if needed * Get a FLU shot once a year * Get a PNEUMONIA vaccine once before and once after age 84 years  GOALS:  * Your Hemoglobin A1c of <7%  * fasting sugars need to be <130 * after meals sugars need to be <180 (2h after you start eating) * Your Systolic BP should be 102 or lower  * Your Diastolic BP should be 80 or lower  * Your HDL (Good Cholesterol) should be 40 or higher  * Your LDL (Bad Cholesterol) should be 100 or lower. * Your Triglycerides should be 150 or lower  * Your Urine microalbumin (kidney function) should be <30 * Your Body Mass Index should be 25 or lower    Please consider the following ways to cut down carbs and fat and increase fiber and micronutrients in your diet: - substitute whole grain for white bread or pasta - substitute brown rice for white rice - substitute 90-calorie flat bread pieces for slices  of bread when possible - substitute sweet potatoes or yams for white potatoes - substitute humus for margarine - substitute tofu for cheese when possible - substitute almond or rice milk for regular milk (would not drink soy milk daily due to concern for soy estrogen influence on breast cancer risk) - substitute dark chocolate for other sweets when possible - substitute water - can add lemon or orange slices for taste - for diet sodas (artificial sweeteners will trick your body that you can eat  sweets without getting calories and will lead you to overeating and weight gain in the long run) - do not skip breakfast or other meals (this will slow down the metabolism and will result in more weight gain over time)  - can try smoothies made from fruit and almond/rice milk in am instead of regular breakfast - can also try old-fashioned (not instant) oatmeal made with almond/rice milk in am - order the dressing on the side when eating salad at a restaurant (pour less than half of the dressing on the salad) - eat as little meat as possible - can try juicing, but should not forget that juicing will get rid of the fiber, so would alternate with eating raw veg./fruits or drinking smoothies - use as little oil as possible, even when using olive oil - can dress a salad with a mix of balsamic vinegar and lemon juice, for e.g. - use agave nectar, stevia sugar, or regular sugar rather than artificial sweateners - steam or broil/roast veggies  - snack on veggies/fruit/nuts (unsalted, preferably) when possible, rather than processed foods - reduce or eliminate aspartame in diet (it is in diet sodas, chewing gum, etc) Read the labels!  Try to read Dr. Janene Harvey book: "Program for Reversing Diabetes" for other ideas for healthy eating.

## 2017-06-20 NOTE — Progress Notes (Signed)
Patient ID: [name], male   DOB: 20-Apr-1966, 51 y.o.   MRN: [id number]   HPI: Jim [name] is a 51 y.o.-year-old male, referred by his PCP, Jim Schwalbe Yvetta Coder, FNP, for management of DM2, dx in 02/2011, non-insulin-dependent, uncontrolled, without long complications.  Last hemoglobin A1c was: Lab Results  Component Value Date   HGBA1C 8.9 (H) 04/16/2017   HGBA1C 7.4 11/22/2016   HGBA1C 7.6 (H) 08/21/2016   Pt is on a regimen of: - Metformin ER 750 2x a day, with meals - Glipizide 10 mg 2x a day He was on Jardiance 10 mg daily in am - started 11/2016 >> lost weight (15 lbs) and sugars were higher >> stopped 04/2017 He was on insulin right after dx.  Pt checks his sugars 0-1x a day and they are: - am: 140-160, 200 x1 - 2h after b'fast: n/c - before lunch: 100 - 2h after lunch: n/c - before dinner: n/c (8-9 pm) - 2h after dinner: n/c - bedtime: n/c - nighttime: n/c Lowest sugar was 90s; he has hypoglycemia awareness at 90.  Highest sugar was 240s (after a meal).  Glucometer: TruePlus  Pt's meals are: - Breakfast: bowl cereals, 1/2 bagel + cream cheese, coffee - Lunch: sandwich/sub/burrito, half and half sweet tea - Dinner: chicken or fish + veggies + starches - Snacks: protein bar if needed in am; may have cereal bowl after dinner No formal exercise, but does walk a lot at work.  - no CKD, last BUN/creatinine:  Lab Results  Component Value Date   BUN 19 04/16/2017   BUN 21 08/21/2016   CREATININE 0.76 04/16/2017   CREATININE 1.02 08/21/2016   - + Dyslipidemia; last set of lipids: Lab Results  Component Value Date   CHOL 160 04/16/2017   HDL 39.00 (L) 04/16/2017   LDLCALC 94 04/16/2017   LDLDIRECT 90.0 04/30/2015   TRIG 135.0 04/16/2017   CHOLHDL 4 04/16/2017   - last eye exam was in 2017. No DR.  - No numbness and tingling in his feet. He was on neurontin 300 mg in hs for back pain.  Pt has FH of DM in older brother, nephew  (DM1).  ROS: Constitutional: + weight gain, but prev. Weight loss, no fatigue, no subjective hyperthermia/hypothermia Eyes: no blurry vision, no xerophthalmia ENT: no sore throat, no nodules palpated in throat, no dysphagia/odynophagia, no hoarseness, + tinnitus, + hypoacusis Cardiovascular: no CP/SOB/palpitations/leg swelling Respiratory: no cough/SOB Gastrointestinal: no N/V/D/C Musculoskeletal: + both:  muscle/joint aches Skin: no rashes Neurological: no tremors/numbness/tingling/dizziness Psychiatric: no depression/anxiety  Past Medical History:  Diagnosis Date  . Diabetes mellitus 2012  . GERD (gastroesophageal reflux disease)    Past Surgical History:  Procedure Laterality Date  . CLOSED REDUCTION ANKLE FRACTURE     Social History   Socioeconomic History  . Marital status: Married    Spouse name: Not on file  . Number of children: 2  Social Needs  Occupational History  .  [employer]  Tobacco Use  . Smoking status: Never Smoker  . Smokeless tobacco: Never Used  Substance and Sexual Activity  . Alcohol use: Yes    Comment: ocassionally, less than 1 drink per week, wine beer  . Drug use: No  Social History Narrative   Works for [employer]   . Lives in [place]. 2 children, daughter 68YO and son 11YO   Current Outpatient Medications on File Prior to Visit  Medication Sig Dispense Refill  . gabapentin (NEURONTIN) 300  MG capsule nightly 30 capsule 3  . glipiZIDE (GLUCOTROL) 10 MG tablet Take 1 tablet (10 mg total) by mouth 2 (two) times daily before a meal. 180 tablet 3  . Glucose Blood (BLOOD GLUCOSE TEST STRIPS) STRP True Result test strips, to test blood glucose 3 times daily, use as directed 100 each 5  . metFORMIN (GLUCOPHAGE-XR) 750 MG 24 hr tablet Take 1 tablet (750 mg total) 2 (two) times daily by mouth. 180 tablet 1  . TRUEPLUS LANCETS 30G MISC USE AS DIRECTED 100 each 11  . Vitamin D, Ergocalciferol, (DRISDOL) 50000 units CAPS capsule Take  1 capsule (50,000 Units total) by mouth every 7 (seven) days. 12 capsule 0  . acetaminophen (TYLENOL) 500 MG tablet Take 500 mg by mouth every 6 (six) hours as needed.    . fluticasone (FLONASE) 50 MCG/ACT nasal spray 2 sprays into each nostril daily. (Patient not taking: Reported on 06/20/2017) 16 g 6  . meloxicam (MOBIC) 15 MG tablet Take 1 tablet (15 mg total) by mouth daily. (Patient not taking: Reported on 06/20/2017) 30 tablet 0  . sildenafil (VIAGRA) 100 MG tablet Take 1 tablet (100 mg total) by mouth daily as needed for erectile dysfunction. (Patient not taking: Reported on 06/20/2017) 3 tablet 11   No current facility-administered medications on file prior to visit.    Allergies  Allergen Reactions  . Ciprofloxacin Rash   Family History  Problem Relation Age of Onset  . Stroke Paternal Grandfather   . Diabetes Brother   . Parkinsonism Maternal Grandfather     PE: BP 118/78   Pulse 94   Wt 157 lb 6.4 oz (71.4 kg)   SpO2 97%   BMI 23.93 kg/m  Wt Readings from Last 3 Encounters:  06/20/17 157 lb 6.4 oz (71.4 kg)  05/11/17 152 lb (68.9 kg)  04/18/17 147 lb 6.4 oz (66.9 kg)   Constitutional: normal weight, in NAD Eyes: PERRLA, EOMI, no exophthalmos ENT: moist mucous membranes, no thyromegaly, no cervical lymphadenopathy Cardiovascular: Tachycardia, RR, No MRG Respiratory: CTA B Gastrointestinal: abdomen soft, NT, ND, BS+ Musculoskeletal: no deformities, strength intact in all 4 Skin: moist, warm, no rashes Neurological: no tremor with outstretched hands, DTR normal in all 4  ASSESSMENT: 1. DM2, non-insulin-dependent, uncontrolled, without long-term complications, but with hyperglycemia  PLAN:  1. Patient with long-standing, uncontrolled diabetes, on oral antidiabetic regimen, which became insufficient.  He is currently on metformin and glipizide, but previously he did poorly on Jardiance, with weight loss and more hyperglycemia.  Jardiance was stopped 2 months ago and  glipizide was restarted. - At this visit, he describes a.m. fasting sugars that are higher than target and he occasionally checks postprandial glucose levels and these are usually high at today's visit, a glucose level obtained after breakfast (bowl of cereal with milk and also one bagel) was 299.  We discussed about healthier choices for breakfast with less carb and also less fat. - Based on his body habitus, his response to Jardiance, and his blood sugar patterns, I suspect that he may have a degree of insulin deficiency.  As his sugar today is 299, we cannot check his insulin production today, but will do this at next visit. - At this visit, I suggested to add Januvia and also will move glipizide before meals, since now he is taking it after meals.  I will also decrease the dose of glipizide to 5 mg twice a day to avoid overstimulation of his pancreas for now. - I  advised him to contact me before next visit if his sugars are not better controlled - I suggested to:  Patient Instructions  Please continue: - Metformin ER 750 mg 2x a day with meals  Please decrease: - Glipizide to 5 mg and move this 30 min before b'fast and dinner  Please add: - Januvia 100 mg daily 30 min before b'fast  Please let me know if the sugars are consistently <80 or >200.  Please return in 1.5 months with your sugar log.   - Strongly advised him to start checking sugars at different times of the day - check 1-2 times a day, rotating checks - given sugar log and advised how to fill it and to bring it at next appt  - given foot care handout and explained the principles  - given instructions for hypoglycemia management "15-15 rule"  - advised for yearly eye exams >> he is due - Return to clinic in 1.5 mo with sugar log   Philemon Kingdom, MD PhD [facility] Endocrinology

## 2017-07-19 MED FILL — JANUVIA 100 MG TABLET: 100 | 30 days supply | Qty: 30 | Fill #1

## 2017-08-21 MED FILL — JANUVIA 100 MG TABLET: 100 | 30 days supply | Qty: 30 | Fill #2

## 2017-08-28 MED FILL — METFORMIN HCL ER 750 MG TAB: 750 | 90 days supply | Qty: 180 | Fill #1 | Status: TO

## 2017-08-31 ENCOUNTER — Encounter: Payer: Self-pay | Admitting: Internal Medicine

## 2017-08-31 ENCOUNTER — Ambulatory Visit: Payer: 59 | Admitting: Internal Medicine

## 2017-08-31 VITALS — BP 122/82 | HR 95 | Ht 68.0 in | Wt 160.4 lb

## 2017-08-31 DIAGNOSIS — E1165 Type 2 diabetes mellitus with hyperglycemia: Secondary | ICD-10-CM

## 2017-08-31 DIAGNOSIS — E785 Hyperlipidemia, unspecified: Secondary | ICD-10-CM

## 2017-08-31 LAB — POCT GLUCOSE (DEVICE FOR HOME USE): POC Glucose: 264 mg/dl — AB (ref 70–99)

## 2017-08-31 LAB — POCT GLYCOSYLATED HEMOGLOBIN (HGB A1C): Hemoglobin A1C: 7.4

## 2017-08-31 MED ORDER — DULAGLUTIDE 0.75 MG/0.5ML ~~LOC~~ SOAJ: SUBCUTANEOUS | 1 refills | Status: DC

## 2017-08-31 MED FILL — TRULICITY 0.75 MG/0.5 ML PE: 0.75 | 28 days supply | Qty: 2 | Fill #0

## 2017-08-31 NOTE — Progress Notes (Signed)
Patient ID: Jim [name], male   DOB: 1965/09/26, 52 y.o.   MRN: [id number]   HPI: Jim [name] is a 52 y.o.-year-old male, returning for follow-up for DM2, dx in 02/2011, non-insulin-dependent, uncontrolled, without long complications.  Last visit 2.5 months ago.  Last hemoglobin A1c was: Lab Results  Component Value Date   HGBA1C 8.9 (H) 04/16/2017   HGBA1C 7.4 11/22/2016   HGBA1C 7.6 (H) 08/21/2016   Pt is on a regimen of:  Metformin ER 750 mg 2x a day with meals  Glipizide 5 mg 30 minutes before breakfast and dinner  Januvia 100 mg 30 minutes before breakfast - added 06/2017 He was on Jardiance 10 mg daily in am - started 11/2016 >> lost weight (15 lbs) and sugars were higher >> stopped 04/2017 He was on insulin right after dx.  Pt checks his sugars  Once a day: - am: 140-160, 200 x1 >> 90s, 100, 140-150, 200s (snack at night) - 2h after b'fast: n/c >> 264 (today) - before lunch: 100 >> n/c - 2h after lunch: n/c >> 180-190s - before dinner: n/c (8-9 pm) >> n/c - 2h after dinner: n/c - bedtime: n/c - nighttime: n/c Lowest sugar was 90s >> 90s; he has hypoglycemia awareness in the 90s. Highest sugar was 240s (after a meal) >> 200s.  Glucometer: TruePlus  Pt's meals are: - Breakfast: bowl cereals, 1/2 bagel + cream cheese, coffee - Lunch: sandwich/sub/burrito, half and half sweet tea - Dinner: chicken or fish + veggies + starches - Snacks: protein bar if needed in am; may have cereal bowl after dinner No formal exercise, but does a lot of walking at work.  -No CKD, last BUN/creatinine:  Lab Results  Component Value Date   BUN 19 04/16/2017   BUN 21 08/21/2016   CREATININE 0.76 04/16/2017   CREATININE 1.02 08/21/2016  Not on an ACE inhibitor or ARB. -+ Dyslipidemia; last set of lipids: Lab Results  Component Value Date   CHOL 160 04/16/2017   HDL 39.00 (L) 04/16/2017   LDLCALC 94 04/16/2017   LDLDIRECT 90.0 04/30/2015   TRIG 135.0 04/16/2017   CHOLHDL 4  04/16/2017  Not on a statin. - last eye exam was in 2017: No DR -No numbness and tingling in his feet.   Pt has FH of DM in older brother, nephew (DM1).  ROS: Constitutional: no weight gain/no weight loss, no fatigue, no subjective hyperthermia, no subjective hypothermia Eyes: no blurry vision, no xerophthalmia ENT: no sore throat, no nodules palpated in throat, no dysphagia, no odynophagia, no hoarseness Cardiovascular: no CP/no SOB/no palpitations/no leg swelling Respiratory: no cough/no SOB/no wheezing Gastrointestinal: no N/no V/no D/no C/no acid reflux Musculoskeletal: no muscle aches/no joint aches Skin: no rashes, no hair loss Neurological: no tremors/no numbness/no tingling/no dizziness  I reviewed pt's medications, allergies, PMH, social hx, family hx, and changes were documented in the history of present illness. Otherwise, unchanged from my initial visit note.  Past Medical History:  Diagnosis Date  . Diabetes mellitus 2012  . GERD (gastroesophageal reflux disease)    Past Surgical History:  Procedure Laterality Date  . CLOSED REDUCTION ANKLE FRACTURE     Social History   Socioeconomic History  . Marital status: Married    Spouse name: Not on file  . Number of children: 2  Social Needs  Occupational History  .  [employer]  Tobacco Use  . Smoking status: Never Smoker  . Smokeless tobacco: Never Used  Substance and  Sexual Activity  . Alcohol use: Yes    Comment: ocassionally, less than 1 drink per week, wine beer  . Drug use: No  Social History Narrative   Works for [employer]   . Lives in [place]. 2 children, daughter 53YO and son 11YO   Current Outpatient Medications on File Prior to Visit  Medication Sig Dispense Refill  . acetaminophen (TYLENOL) 500 MG tablet Take 500 mg by mouth every 6 (six) hours as needed.    . fluticasone (FLONASE) 50 MCG/ACT nasal spray 2 sprays into each nostril daily. (Patient not taking: Reported on  06/20/2017) 16 g 6  . gabapentin (NEURONTIN) 300 MG capsule nightly 30 capsule 3  . glipiZIDE (GLUCOTROL) 10 MG tablet Take 0.5 tablets (5 mg total) by mouth 2 (two) times daily before a meal. 180 tablet 3  . Glucose Blood (BLOOD GLUCOSE TEST STRIPS) STRP True Result test strips, to test blood glucose 3 times daily, use as directed 100 each 5  . meloxicam (MOBIC) 15 MG tablet Take 1 tablet (15 mg total) by mouth daily. (Patient not taking: Reported on 06/20/2017) 30 tablet 0  . metFORMIN (GLUCOPHAGE-XR) 750 MG 24 hr tablet Take 1 tablet (750 mg total) 2 (two) times daily by mouth. 180 tablet 1  . sildenafil (VIAGRA) 100 MG tablet Take 1 tablet (100 mg total) by mouth daily as needed for erectile dysfunction. (Patient not taking: Reported on 06/20/2017) 3 tablet 11  . sitaGLIPtin (JANUVIA) 100 MG tablet Take 1 tablet (100 mg total) by mouth daily. 30 tablet 5  . TRUEPLUS LANCETS 30G MISC USE AS DIRECTED 100 each 11  . Vitamin D, Ergocalciferol, (DRISDOL) 50000 units CAPS capsule Take 1 capsule (50,000 Units total) by mouth every 7 (seven) days. 12 capsule 0   No current facility-administered medications on file prior to visit.    Allergies  Allergen Reactions  . Ciprofloxacin Rash   Family History  Problem Relation Age of Onset  . Stroke Paternal Grandfather   . Diabetes Brother   . Parkinsonism Maternal Grandfather     PE: BP 122/82 (BP Location: Left Arm, Patient Position: Sitting, Cuff Size: Normal)   Pulse 95   Ht 5\' 8"  (1.727 m)   Wt 160 lb 6.4 oz (72.8 kg)   BMI 24.39 kg/m  Wt Readings from Last 3 Encounters:  08/31/17 160 lb 6.4 oz (72.8 kg)  06/20/17 157 lb 6.4 oz (71.4 kg)  05/11/17 152 lb (68.9 kg)   Constitutional: Normal weight, in NAD Eyes: PERRLA, EOMI, no exophthalmos ENT: moist mucous membranes, no thyromegaly, no cervical lymphadenopathy Cardiovascular: + tachycardia, RR, No MRG Respiratory: CTA B Gastrointestinal: abdomen soft, NT, ND, BS+ Musculoskeletal: no  deformities, strength intact in all 4 Skin: moist, warm, no rashes Neurological: no tremor with outstretched hands, DTR normal in all 4  ASSESSMENT: 1. DM2, non-insulin-dependent, uncontrolled, without long-term complications, but with hyperglycemia  2.  Dyslipidemia  PLAN:  1. Patient with long-standing, uncontrolled, diabetes, on oral antidiabetic regimen, which we modified at last visit.  He was previously on Jardiance, which caused weight loss and more hyperglycemia.  At last visit, we started Januvia and moved glipizide before rather than after meals as his postprandial sugars were high.  We also discussed about healthier meals at last visit. - Due to his previous response to Jardiance, I suspect that he may be insulin deficient.  His glucose during last visit was 299, too high to check for insulin deficiency.  At this visit, glucose  is 264, so we still cannot check a C-peptide - since last visit, sugars improved, but they are still high >> will switch from Januvia to Trulicity >> discussed possibility of wt loss - I suggested to:  Patient Instructions  Please continue:  Metformin ER 750 mg 2x a day with meals  Glipizide 5 mg 30 minutes before breakfast and dinner  Please start Trulicity 8.45 mg weekly. Let me know when you are close to running out to call in the higher dose to your pharmacy (1.5 mg).  Please stop:  Januvia   Please return in 3 months with your sugar log.   - today, HbA1c is 7.4% (impoved) - continue checking sugars at different times of the day - check 1x a day, rotating checks - advised for yearly eye exams >> he is not UTD - Return to clinic in 3 mo with sugar log   2.  Dyslipidemia  Reviewed his most recent lipid panel from 04/2017: LDL lower than 100, but HDL slightly low  He is not on a statin  Discussed about improving his diet >> advised to limit meats, cheese, eggs   Philemon Kingdom, MD PhD [facility] Endocrinology

## 2017-08-31 NOTE — Patient Instructions (Signed)
Please continue:  Metformin ER 750 mg 2x a day with meals  Glipizide 5 mg 30 minutes before breakfast and dinner  Please start Trulicity 6.28 mg weekly. Let me know when you are close to running out to call in the higher dose to your pharmacy (1.5 mg).  Please stop:  Januvia   Please return in 3 months with your sugar log.

## 2017-09-25 ENCOUNTER — Telehealth: Payer: Self-pay | Admitting: Internal Medicine

## 2017-09-25 NOTE — Telephone Encounter (Signed)
Patient just finished up the 4 th week of Trulicity .75. Patient wants to know when to begin upping dosage  to 1.5. Patient wants to change his Pharmacy to [facility]. Please send new RX for Trulicty 1.5 to [facility]

## 2017-09-26 MED ORDER — DULAGLUTIDE 1.5 MG/0.5ML ~~LOC~~ SOAJ: SUBCUTANEOUS | 1 refills | Status: DC

## 2017-09-26 MED FILL — TRULICITY 1.5 MG/0.5 ML PEN: 1.5 | 28 days supply | Qty: 2 | Fill #0

## 2017-09-26 NOTE — Telephone Encounter (Signed)
Spoke to patient. Advised Rx sent to pharmacy as requested. Ok to begin next week. Patient verbalized understanding.

## 2017-09-27 ENCOUNTER — Encounter: Payer: Self-pay | Admitting: Internal Medicine

## 2017-09-27 ENCOUNTER — Ambulatory Visit (INDEPENDENT_AMBULATORY_CARE_PROVIDER_SITE_OTHER): Payer: 59 | Admitting: Internal Medicine

## 2017-09-27 ENCOUNTER — Ambulatory Visit (INDEPENDENT_AMBULATORY_CARE_PROVIDER_SITE_OTHER): Payer: 59

## 2017-09-27 VITALS — BP 126/64 | HR 119 | Temp 99.2°F | Resp 15 | Ht 68.0 in | Wt 154.4 lb

## 2017-09-27 DIAGNOSIS — R062 Wheezing: Secondary | ICD-10-CM | POA: Diagnosis not present

## 2017-09-27 DIAGNOSIS — J22 Unspecified acute lower respiratory infection: Secondary | ICD-10-CM | POA: Diagnosis not present

## 2017-09-27 DIAGNOSIS — J209 Acute bronchitis, unspecified: Secondary | ICD-10-CM

## 2017-09-27 LAB — CBC WITH DIFFERENTIAL/PLATELET
BASOS PCT: 0.7 % (ref 0.0–3.0)
Basophils Absolute: 0.1 10*3/uL (ref 0.0–0.1)
EOS PCT: 0.2 % (ref 0.0–5.0)
Eosinophils Absolute: 0 10*3/uL (ref 0.0–0.7)
HEMATOCRIT: 39.6 % (ref 39.0–52.0)
HEMOGLOBIN: 13.5 g/dL (ref 13.0–17.0)
LYMPHS PCT: 9.5 % — AB (ref 12.0–46.0)
Lymphs Abs: 1.4 10*3/uL (ref 0.7–4.0)
MCHC: 34.1 g/dL (ref 30.0–36.0)
MCV: 83.9 fl (ref 78.0–100.0)
MONO ABS: 2.1 10*3/uL — AB (ref 0.1–1.0)
Monocytes Relative: 14.2 % — ABNORMAL HIGH (ref 3.0–12.0)
Neutro Abs: 11 10*3/uL — ABNORMAL HIGH (ref 1.4–7.7)
Neutrophils Relative %: 75.4 % (ref 43.0–77.0)
Platelets: 449 10*3/uL — ABNORMAL HIGH (ref 150.0–400.0)
RBC: 4.72 Mil/uL (ref 4.22–5.81)
RDW: 12.6 % (ref 11.5–15.5)
WBC: 14.5 10*3/uL — AB (ref 4.0–10.5)

## 2017-09-27 MED ORDER — PREDNISONE 10 MG PO TABS: ORAL_TABLET | ORAL | 0 refills | Status: DC

## 2017-09-27 MED ORDER — BENZONATATE 200 MG PO CAPS: 200.0000 mg | ORAL_CAPSULE | Freq: Two times a day (BID) | ORAL | 0 refills | Status: DC | PRN

## 2017-09-27 MED ORDER — AMOXICILLIN-POT CLAVULANATE 875-125 MG PO TABS: 1.0000 | ORAL_TABLET | Freq: Two times a day (BID) | ORAL | 0 refills | Status: DC

## 2017-09-27 MED FILL — predniSONE 10 MG (21) TBPK: 10 | 6 days supply | Qty: 21 | Fill #0

## 2017-09-27 MED FILL — BENZONATATE 200 MG CAPS: 200 | 10 days supply | Qty: 20 | Fill #0

## 2017-09-27 MED FILL — AMOX TR-K CLV 875-125 MG TA: 875-125 | 7 days supply | Qty: 14 | Fill #0

## 2017-09-27 NOTE — Patient Instructions (Signed)
I am treating you for pneumonia based on your presentation  I will let you know if your flu test was positive and how your chest x ray looks   augmentin twice daily  With food for 7 days  Prednisone taper starting today for 6 days  Tessalon cough capsules every 8 hours if needed for cough  Delsym for nighttimecough  Rest and fluids  Daily use of a probiotic is  iadvised for 3 weeks. Please take a probiotic ( Align, Floraque or Culturelle), the generic version of one of these over the counter medications, or an alternative form (kombucha,  Yogurt, or another dietary source) for a minimum of 3 weeks to prevent a serious antibiotic associated diarrhea  Called clostridium dificile colitis.

## 2017-09-27 NOTE — Progress Notes (Signed)
Subjective:  Patient ID: Jim [name], male    DOB: [birth year]  Age: 52 y.o. MRN: [id number]  CC: The primary encounter diagnosis was Lower respiratory infection (e.g., bronchitis, pneumonia, pneumonitis, pulmonitis). A diagnosis of Acute bronchitis, unspecified organism was also pertinent to this visit.  HPI Jim [name] presents for persistent  lower respiratory  symptoms  Started 2 weeks ago with   fever of 100.2 .  He had  subjective fevers, body aches, loose stools for one day , accompanied by persistnet profound  Fatigue. ,  Had multiple Sick contacts.  Has been staying up  at night due to cough.  Started to feel al little better for a day,  Then cough returned   Nonproductive .  Chest tightness hears a rattling.    No pleurisy . Chest and stomach and back muscles are all sore . Fatigue has not improved  meds tried: dayquil , delsym. (did not tolerate one dose of delsym due to drowsiness) .  Gets super drowsy with otc meds.    No flu vaccine,   Outpatient Medications Prior to Visit  Medication Sig Dispense Refill  . acetaminophen (TYLENOL) 500 MG tablet Take 500 mg by mouth every 6 (six) hours as needed.    . Dulaglutide (TRULICITY) 1.5 WU/9.8JX SOPN Inject under skin 1.5 mg weekly in am 4 pen 1  . glipiZIDE (GLUCOTROL) 10 MG tablet Take 0.5 tablets (5 mg total) by mouth 2 (two) times daily before a meal. 180 tablet 3  . Glucose Blood (BLOOD GLUCOSE TEST STRIPS) STRP True Result test strips, to test blood glucose 3 times daily, use as directed 100 each 5  . metFORMIN (GLUCOPHAGE-XR) 750 MG 24 hr tablet Take 1 tablet (750 mg total) 2 (two) times daily by mouth. 180 tablet 1  . sildenafil (VIAGRA) 100 MG tablet Take 1 tablet (100 mg total) by mouth daily as needed for erectile dysfunction. 3 tablet 11  . TRUEPLUS LANCETS 30G MISC USE AS DIRECTED 100 each 11  . fluticasone (FLONASE) 50 MCG/ACT nasal spray 2 sprays into each nostril daily. (Patient not taking: Reported on 09/27/2017)  16 g 6  . gabapentin (NEURONTIN) 300 MG capsule nightly (Patient not taking: Reported on 08/31/2017) 30 capsule 3  . meloxicam (MOBIC) 15 MG tablet Take 1 tablet (15 mg total) by mouth daily. (Patient not taking: Reported on 09/27/2017) 30 tablet 0   No facility-administered medications prior to visit.     Review of Systems;  Patient denies headache, fevers, malaise, unintentional weight loss, skin rash, eye pain, sinus congestion and sinus pain, sore throat, dysphagia,  hemoptysis , cough, dyspnea, wheezing, chest pain, palpitations, orthopnea, edema, abdominal pain, nausea, melena, diarrhea, constipation, flank pain, dysuria, hematuria, urinary  Frequency, nocturia, numbness, tingling, seizures,  Focal weakness, Loss of consciousness,  Tremor, insomnia, depression, anxiety, and suicidal ideation.      Objective:  BP 126/64 (BP Location: Left Arm, Patient Position: Sitting, Cuff Size: Normal)   Pulse (!) 119   Temp 99.2 F (37.3 C) (Oral)   Resp 15   Ht 5\' 8"  (1.727 m)   Wt 154 lb 6.4 oz (70 kg)   SpO2 97%   BMI 23.48 kg/m   BP Readings from Last 3 Encounters:  09/27/17 126/64  08/31/17 122/82  06/20/17 118/78    Wt Readings from Last 3 Encounters:  09/27/17 154 lb 6.4 oz (70 kg)  08/31/17 160 lb 6.4 oz (72.8 kg)  06/20/17 157 lb 6.4 oz (71.4 kg)  General appearance: alert, cooperative and appears stated age. Appears ill.  Ears: normal TM's and external ear canals both ears Throat: lips, mucosa, and tongue normal; teeth and gums normal Neck: no adenopathy, no carotid bruit, supple, symmetrical, trachea midline and thyroid not enlarged, symmetric, no tenderness/mass/nodules Back: symmetric, no curvature. ROM normal. No CVA tenderness. Lungs: bilateral wheezing,   Some ronchi, but GAM Heart: regular rate and rhythm, S1, S2 normal, no murmur, click, rub or gallop Abdomen: soft, non-tender; bowel sounds normal; no masses,  no organomegaly Pulses: 2+ and symmetric Skin:  diaphoretic .  Skin color, normal. No rashes or lesions Lymph nodes: Cervical, supraclavicular, and axillary nodes normal.  Lab Results  Component Value Date   HGBA1C 7.4 08/31/2017   HGBA1C 8.9 (H) 04/16/2017   HGBA1C 7.4 11/22/2016    Lab Results  Component Value Date   CREATININE 0.76 04/16/2017   CREATININE 1.02 08/21/2016   CREATININE 0.99 01/20/2016    Lab Results  Component Value Date   WBC 14.5 (H) 09/27/2017   HGB 13.5 09/27/2017   HCT 39.6 09/27/2017   PLT 449.0 (H) 09/27/2017   GLUCOSE 170 (H) 04/16/2017   CHOL 160 04/16/2017   TRIG 135.0 04/16/2017   HDL 39.00 (L) 04/16/2017   LDLDIRECT 90.0 04/30/2015   LDLCALC 94 04/16/2017   ALT 22 04/16/2017   AST 14 04/16/2017   NA 139 04/16/2017   K 4.2 04/16/2017   CL 102 04/16/2017   CREATININE 0.76 04/16/2017   BUN 19 04/16/2017   CO2 24 04/16/2017   TSH 1.164 03/06/2011   INR 1.00 03/06/2011   HGBA1C 7.4 08/31/2017   MICROALBUR 0.9 10/25/2015     Assessment & Plan:   Problem List Items Addressed This Visit    Bronchitis, acute    With mild leukocytosis ,  Normal chest x ray.  Empiric antibiotics ,  Steroids, and cough suppressant.  Push fluids ,   Work note written to use for 48 hours of rest.        Other Visit Diagnoses    Lower respiratory infection (e.g., bronchitis, pneumonia, pneumonitis, pulmonitis)    -  Primary   Relevant Orders   DG Chest 2 View (Completed)   CBC with Differential/Platelet (Completed)   POCT Influenza A/B (Completed)      I have discontinued Tami Lin. Pitz's gabapentin and meloxicam. I am also having him start on amoxicillin-clavulanate, predniSONE, and benzonatate. Additionally, I am having him maintain his acetaminophen, fluticasone, sildenafil, TRUEPLUS LANCETS 30G, BLOOD GLUCOSE TEST STRIPS, metFORMIN, glipiZIDE, and Dulaglutide.  Meds ordered this encounter  Medications  . amoxicillin-clavulanate (AUGMENTIN) 875-125 MG tablet    Sig: Take 1 tablet by mouth 2  (two) times daily.    Dispense:  14 tablet    Refill:  0  . predniSONE (DELTASONE) 10 MG tablet    Sig: 6 tablets on Day 1 , then reduce by 1 tablet daily until gone    Dispense:  21 tablet    Refill:  0  . benzonatate (TESSALON) 200 MG capsule    Sig: Take 1 capsule (200 mg total) by mouth 2 (two) times daily as needed for cough.    Dispense:  20 capsule    Refill:  0    Medications Discontinued During This Encounter  Medication Reason  . gabapentin (NEURONTIN) 300 MG capsule Patient has not taken in last 30 days  . meloxicam (MOBIC) 15 MG tablet Patient has not taken in last 30 days  Follow-up: No Follow-up on file.   Crecencio Mc, MD

## 2017-09-28 ENCOUNTER — Encounter: Payer: Self-pay | Admitting: Internal Medicine

## 2017-09-29 DIAGNOSIS — J209 Acute bronchitis, unspecified: Secondary | ICD-10-CM | POA: Insufficient documentation

## 2017-09-29 LAB — POCT INFLUENZA A/B
INFLUENZA A, POC: NEGATIVE
Influenza B, POC: NEGATIVE

## 2017-09-29 NOTE — Assessment & Plan Note (Addendum)
With mild leukocytosis ,  Normal chest x ray.  Empiric antibiotics ,  Steroids, and cough suppressant.  Push fluids ,   Work note written to use for 48 hours of rest.

## 2017-10-10 ENCOUNTER — Other Ambulatory Visit: Payer: Self-pay | Admitting: Family Medicine

## 2017-10-12 MED FILL — glipiZIDE 10 MG TABS: 10 | 90 days supply | Qty: 180 | Fill #0

## 2017-10-19 ENCOUNTER — Other Ambulatory Visit: Payer: Self-pay

## 2017-10-22 ENCOUNTER — Other Ambulatory Visit: Payer: Self-pay

## 2017-10-22 MED ORDER — GLIPIZIDE 5 MG PO TABS: 5.0000 mg | ORAL_TABLET | Freq: Two times a day (BID) | ORAL | 1 refills | Status: DC

## 2017-10-22 MED FILL — glipiZIDE 5 MG TABS: 5 | 90 days supply | Qty: 180 | Fill #0

## 2017-10-24 MED FILL — TRULICITY 1.5 MG/0.5 ML PEN: 1.5 | 28 days supply | Qty: 2 | Fill #1

## 2017-11-20 ENCOUNTER — Other Ambulatory Visit: Payer: Self-pay | Admitting: Internal Medicine

## 2017-11-20 MED FILL — TRULICITY 1.5 MG/0.5 ML PEN: 1.5 | 28 days supply | Qty: 2 | Fill #0

## 2017-11-26 ENCOUNTER — Other Ambulatory Visit: Payer: Self-pay | Admitting: Family

## 2017-11-27 MED FILL — METFORMIN HCL ER 750 MG TAB: 750 | 90 days supply | Qty: 180 | Fill #0

## 2017-11-28 ENCOUNTER — Other Ambulatory Visit: Payer: Self-pay | Admitting: Internal Medicine

## 2017-11-28 ENCOUNTER — Ambulatory Visit (INDEPENDENT_AMBULATORY_CARE_PROVIDER_SITE_OTHER): Payer: 59 | Admitting: Internal Medicine

## 2017-11-28 ENCOUNTER — Encounter: Payer: Self-pay | Admitting: Internal Medicine

## 2017-11-28 VITALS — BP 108/72 | HR 102 | Ht 68.0 in | Wt 152.0 lb

## 2017-11-28 DIAGNOSIS — E1165 Type 2 diabetes mellitus with hyperglycemia: Secondary | ICD-10-CM

## 2017-11-28 DIAGNOSIS — E785 Hyperlipidemia, unspecified: Secondary | ICD-10-CM

## 2017-11-28 LAB — POCT GLUCOSE (DEVICE FOR HOME USE): POC Glucose: 175 mg/dl — AB (ref 70–99)

## 2017-11-28 LAB — POCT GLYCOSYLATED HEMOGLOBIN (HGB A1C): HEMOGLOBIN A1C: 7.2

## 2017-11-28 MED ORDER — DULAGLUTIDE 1.5 MG/0.5ML ~~LOC~~ SOAJ: SUBCUTANEOUS | 3 refills | Status: DC

## 2017-11-28 NOTE — Patient Instructions (Signed)
Please continue:  Metformin ER 750 2X a day with meals ls  Glipizide 5 mg 30 minutes before breakfast and dinner  Trulicity 1.5 mg weekly  Please return in 4 months with your sugar log.

## 2017-11-28 NOTE — Progress Notes (Signed)
Patient ID: Jim [name], male   DOB: [birth year], 52 y.o.   MRN: [id number]   HPI: Jim [name] is a 52 y.o.-year-old male, returning for follow-up for DM2, dx in 02/2011, non-insulin-dependent, uncontrolled, without long complications.  Last visit 3 months ago.  He had a severe URI late 03-10/2017 >> sugars higher: 200s.   Last hemoglobin A1c was: Lab Results  Component Value Date   HGBA1C 7.4 08/31/2017   HGBA1C 8.9 (H) 04/16/2017   HGBA1C 7.4 11/22/2016   Pt is on a regimen of:  Metformin ER 750 mg 2x a day with meals  Glipizide 5 mg 30 minutes before breakfast and dinner    Trulicity 1.5 mg weekly-started 08/2017 He was on Jardiance 10 mg daily in am - started 11/2016 >> lost weight (15 lbs) and sugars were higher >> stopped 04/2017 He was on insulin right after dx.  Pt checks his sugars 1X a day: - am: 140-160, 200 x1 >> 90s, 100, 140-150, 200s >> 89, 134-162, 204 - 2h after b'fast: n/c >> 264 (today) >> n/c - before lunch: 100 >> n/c - 2h after lunch: n/c >> 180-190s >> n/c  - before dinner: n/c (8-9 pm) >> n/c >> 107-131 - 2h after dinner: n/c - bedtime: n/c - nighttime: n/c Lowest sugar was 90s >> 89; he has hypoglycemia awareness  in the 90s. Highest sugar was 200s >> 316 when sick   Glucometer: TruePlus  Pt's meals are: - Breakfast: bowl cereals, 1/2 bagel + cream cheese, coffee - Lunch: sandwich/sub/burrito, half and half sweet tea - Dinner: chicken or fish + veggies + starches - Snacks: protein bar if needed in am; may have cereal bowl after dinner No formal exercise, but does a lot of walking at work.  -No CKD, last BUN/creatinine:  Lab Results  Component Value Date   BUN 19 04/16/2017   BUN 21 08/21/2016   CREATININE 0.76 04/16/2017   CREATININE 1.02 08/21/2016  N not on an ACE inhibitor/ARB -+ Dyslipidemia ; last set of lipids: Lab Results  Component Value Date   CHOL 160 04/16/2017   HDL 39.00 (L) 04/16/2017   LDLCALC 94 04/16/2017   LDLDIRECT 90.0 04/30/2015   TRIG 135.0 04/16/2017   CHOLHDL 4 04/16/2017  Not on a statin. - last eye exam was in 2017: No DR - no numbness and tingling in his feet.   Pt has FH of DM in older brother, nephew (DM1).  ROS: Constitutional: + weight loss, no fatigue, no subjective hyperthermia, no subjective hypothermia Eyes: no blurry vision, no xerophthalmia ENT: no sore throat, no nodules palpated in throat, no dysphagia, no odynophagia, no hoarseness Cardiovascular: no CP/no SOB/no palpitations/no leg swelling Respiratory: no cough/no SOB/no wheezing Gastrointestinal: no N/no V/no D/no C/no acid reflux Musculoskeletal: no muscle aches/no joint aches Skin: no rashes, no hair loss Neurological: no tremors/no numbness/no tingling/no dizziness  I reviewed pt's medications, allergies, PMH, social hx, family hx, and changes were documented in the history of present illness. Otherwise, unchanged from my initial visit note.  Past Medical History:  Diagnosis Date  . Diabetes mellitus 2012  . GERD (gastroesophageal reflux disease)    Past Surgical History:  Procedure Laterality Date  . CLOSED REDUCTION ANKLE FRACTURE     Social History   Socioeconomic History  . Marital status: Married    Spouse name: Not on file  . Number of children: 2  Social Needs  Occupational History  .  [employer]  Tobacco Use  .  Smoking status: Never Smoker  . Smokeless tobacco: Never Used  Substance and Sexual Activity  . Alcohol use: Yes    Comment: ocassionally, less than 1 drink per week, wine beer  . Drug use: No  Social History Narrative   Works for [employer]   . Lives in [place]. 2 children, daughter 48YO and son 11YO   Current Outpatient Medications on File Prior to Visit  Medication Sig Dispense Refill  . acetaminophen (TYLENOL) 500 MG tablet Take 500 mg by mouth every 6 (six) hours as needed.    Marland Kitchen amoxicillin-clavulanate (AUGMENTIN) 875-125 MG tablet Take 1 tablet by  mouth 2 (two) times daily. 14 tablet 0  . benzonatate (TESSALON) 200 MG capsule Take 1 capsule (200 mg total) by mouth 2 (two) times daily as needed for cough. 20 capsule 0  . fluticasone (FLONASE) 50 MCG/ACT nasal spray 2 sprays into each nostril daily. (Patient not taking: Reported on 09/27/2017) 16 g 6  . glipiZIDE (GLUCOTROL) 5 MG tablet Take 1 tablet (5 mg total) by mouth 2 (two) times daily before a meal. 180 tablet 1  . Glucose Blood (BLOOD GLUCOSE TEST STRIPS) STRP True Result test strips, to test blood glucose 3 times daily, use as directed 100 each 5  . metFORMIN (GLUCOPHAGE-XR) 750 MG 24 hr tablet TAKE 1 TABLET BY MOUTH 2 TIMES DAILY. 180 tablet 0  . predniSONE (DELTASONE) 10 MG tablet 6 tablets on Day 1 , then reduce by 1 tablet daily until gone 21 tablet 0  . sildenafil (VIAGRA) 100 MG tablet Take 1 tablet (100 mg total) by mouth daily as needed for erectile dysfunction. 3 tablet 11  . TRUEPLUS LANCETS 30G MISC USE AS DIRECTED 100 each 11  . TRULICITY 1.5 AY/3.0ZS SOPN INJECT 1 PEN UNDER THE SKIN WEEKLY IN THE MORNING 2 mL 1   No current facility-administered medications on file prior to visit.    Allergies  Allergen Reactions  . Ciprofloxacin Rash   Family History  Problem Relation Age of Onset  . Stroke Paternal Grandfather   . Diabetes Brother   . Parkinsonism Maternal Grandfather     PE: BP 108/72   Pulse (!) 102   Ht 5\' 8"  (1.727 m)   Wt 152 lb (68.9 kg)   SpO2 97%   BMI 23.11 kg/m  Wt Readings from Last 3 Encounters:  11/28/17 152 lb (68.9 kg)  09/27/17 154 lb 6.4 oz (70 kg)  08/31/17 160 lb 6.4 oz (72.8 kg)   Constitutional: Normal weight, in NAD Eyes: PERRLA, EOMI, no exophthalmos ENT: moist mucous membranes, no thyromegaly, no cervical lymphadenopathy Cardiovascular: tachycardia, RR, No MRG Respiratory: CTA B Gastrointestinal: abdomen soft, NT, ND, BS+ Musculoskeletal: no deformities, strength intact in all 4 Skin: moist, warm, no rashes Neurological:  no tremor with outstretched hands, DTR normal in all 4  ASSESSMENT: 1. DM2, non-insulin-dependent, uncontrolled, without long-term complications, but with hyperglycemia  2.  Dyslipidemia  PLAN:  1. Patient with long-standing, uncontrolled, type 2 diabetes, on oral antidiabetic regimen, to which we added Trulicity at last visit.  He did well with a lower dose of Trulicity and we increase this to the recommended dose recently.  We stopped Januvia when we started the GLP-1 receptor agonist.  At last visit, we also discussed about healthier meals. - sugars are mostly at goal later in the day and slightly high in am, but they were much higher when he was sick - in the 200s.  - overall, sugars are  better >> continue current regimen - we can check type 1 DM labs as his CBG today is 175: Orders Placed This Encounter  Procedures  . C-peptide  . Glucose, fasting  . Anti-islet cell antibody  . Glutamic acid decarboxylase auto abs  . ZNT8 Antibodies  . POCT glycosylated hemoglobin (Hb A1C)  - I suggested to:  Patient Instructions  Please continue:  Metformin ER 750 2X a day with meals ls  Glipizide 5 mg 30 minutes before breakfast and dinner  Trulicity 1.5 mg weekly  Please return in 4 months with your sugar log.   - today, HbA1c is 7.2% (better) - continue checking sugars at different times of the day - check 1x a day, rotating checks - advised for yearly eye exams >> he is UTD - Return to clinic in 4 mo with sugar log    2.  Dyslipidemia - Reviewed latest lipid panel from 04/2017: LDL lower than 100, but HDL slightly low - He is not on a statin. - At last visit, we discussed about improving his diet by reducing fatty foods: Meat, cheese, eggs  Philemon Kingdom, MD PhD [facility]

## 2017-11-29 LAB — ANTI-ISLET CELL ANTIBODY: ISLET CELL AB: NEGATIVE

## 2017-12-02 LAB — GLUCOSE, FASTING: Glucose, Plasma: 153 mg/dL — ABNORMAL HIGH (ref 65–99)

## 2017-12-02 LAB — GLUTAMIC ACID DECARBOXYLASE AUTO ABS

## 2017-12-02 LAB — C-PEPTIDE: C-Peptide: 1.97 ng/mL (ref 0.80–3.85)

## 2017-12-05 LAB — ZNT8 ANTIBODIES

## 2017-12-24 MED FILL — TRULICITY 1.5 MG/0.5 ML PEN: 1.5 | 28 days supply | Qty: 2 | Fill #1

## 2017-12-31 ENCOUNTER — Ambulatory Visit (INDEPENDENT_AMBULATORY_CARE_PROVIDER_SITE_OTHER): Payer: 59 | Admitting: Family

## 2017-12-31 ENCOUNTER — Encounter: Payer: Self-pay | Admitting: Family

## 2017-12-31 VITALS — BP 126/82 | HR 90 | Temp 98.1°F | Ht 68.0 in | Wt 150.5 lb

## 2017-12-31 DIAGNOSIS — R634 Abnormal weight loss: Secondary | ICD-10-CM | POA: Insufficient documentation

## 2017-12-31 DIAGNOSIS — Z8639 Personal history of other endocrine, nutritional and metabolic disease: Secondary | ICD-10-CM | POA: Insufficient documentation

## 2017-12-31 DIAGNOSIS — S90862A Insect bite (nonvenomous), left foot, initial encounter: Secondary | ICD-10-CM | POA: Diagnosis not present

## 2017-12-31 DIAGNOSIS — W57XXXA Bitten or stung by nonvenomous insect and other nonvenomous arthropods, initial encounter: Secondary | ICD-10-CM | POA: Insufficient documentation

## 2017-12-31 DIAGNOSIS — Z125 Encounter for screening for malignant neoplasm of prostate: Secondary | ICD-10-CM

## 2017-12-31 DIAGNOSIS — Z0000 Encounter for general adult medical examination without abnormal findings: Secondary | ICD-10-CM

## 2017-12-31 DIAGNOSIS — E1165 Type 2 diabetes mellitus with hyperglycemia: Secondary | ICD-10-CM

## 2017-12-31 LAB — COMPREHENSIVE METABOLIC PANEL
ALBUMIN: 4.4 g/dL (ref 3.5–5.2)
ALK PHOS: 68 U/L (ref 39–117)
ALT: 26 U/L (ref 0–53)
AST: 15 U/L (ref 0–37)
BUN: 23 mg/dL (ref 6–23)
CALCIUM: 9.8 mg/dL (ref 8.4–10.5)
CO2: 23 mEq/L (ref 19–32)
CREATININE: 1 mg/dL (ref 0.40–1.50)
Chloride: 104 mEq/L (ref 96–112)
GFR: 84.04 mL/min (ref >60.00)
Glucose, Bld: 155 mg/dL — ABNORMAL HIGH (ref 70–99)
Potassium: 4.3 mEq/L (ref 3.5–5.1)
SODIUM: 140 meq/L (ref 135–145)
TOTAL PROTEIN: 6.5 g/dL (ref 6.0–8.3)
Total Bilirubin: 0.4 mg/dL (ref 0.2–1.2)

## 2017-12-31 LAB — LIPID PANEL
CHOLESTEROL: 157 mg/dL (ref 0–200)
HDL: 42 mg/dL (ref >39.00)
LDL Cholesterol: 86 mg/dL (ref 0–99)
NonHDL: 115.33
TRIGLYCERIDES: 145 mg/dL (ref 0.0–149.0)
Total CHOL/HDL Ratio: 4
VLDL: 29 mg/dL (ref 0.0–40.0)

## 2017-12-31 LAB — PSA: PSA: 0.7 ng/mL (ref 0.10–4.00)

## 2017-12-31 LAB — VITAMIN D 25 HYDROXY (VIT D DEFICIENCY, FRACTURES): VITD: 39.68 ng/mL (ref 30.00–100.00)

## 2017-12-31 LAB — TSH: TSH: 2.13 u[IU]/mL (ref 0.35–4.50)

## 2017-12-31 NOTE — Assessment & Plan Note (Signed)
Improving symptomatically.  No evidence of infection at this time.  Patient will stay vigilant and let me know if it does not continue to improve.

## 2017-12-31 NOTE — Progress Notes (Signed)
Subjective:    Patient ID: Jim [name], male    DOB: Jul 14, 1966, 52 y.o.   MRN: [id number]  CC: Jim [name] is a 52 y.o. male who presents today for physical exam.    HPI: Complains of bug bite left foot, states from a bee 2 days ago, pain, redness, swelling is improving. No pain    DM- follows with gerche. Now on trulicity, had been on jardiance. Pending eye appt in 02/2018  HLD- no statin  Weight loss- no cold/heat intolerance. No increased exercise.  NO night sweats, bone pain. Appetite hasnt changed.          Colorectal  Cancer Screening: due Prostate Cancer Screening: Consents to screening.  No prostate cancer family history.  Lung Cancer Screening: No 30 year pack year history and > 55 years. Immunizations       Tetanus - utd         HIV Screening- Candidate for , consents Labs: Screening labs today. Exercise: Gets regular exercise.  Alcohol use: occasional Smoking/tobacco use: Nonsmoker.  Regular dental exams: UTD Wears seat belt: Yes. Skin: no new skin lesions; declines dermatology referral  HISTORY:  Past Medical History:  Diagnosis Date  . Diabetes mellitus 2012  . GERD (gastroesophageal reflux disease)     Past Surgical History:  Procedure Laterality Date  . CLOSED REDUCTION ANKLE FRACTURE     Family History  Problem Relation Age of Onset  . Stroke Paternal Grandfather   . Diabetes Brother   . Parkinsonism Maternal Grandfather       ALLERGIES: Ciprofloxacin  Current Outpatient Medications on File Prior to Visit  Medication Sig Dispense Refill  . acetaminophen (TYLENOL) 500 MG tablet Take 500 mg by mouth every 6 (six) hours as needed.    . benzonatate (TESSALON) 200 MG capsule Take 1 capsule (200 mg total) by mouth 2 (two) times daily as needed for cough. 20 capsule 0  . Dulaglutide (TRULICITY) 1.5 TG/6.2IR SOPN INJECT 1 PEN UNDER THE SKIN WEEKLY IN THE MORNING 12 pen 3  . glipiZIDE (GLUCOTROL) 5 MG tablet Take 1 tablet (5 mg total)  by mouth 2 (two) times daily before a meal. 180 tablet 1  . Glucose Blood (BLOOD GLUCOSE TEST STRIPS) STRP True Result test strips, to test blood glucose 3 times daily, use as directed 100 each 5  . metFORMIN (GLUCOPHAGE-XR) 750 MG 24 hr tablet TAKE 1 TABLET BY MOUTH 2 TIMES DAILY. 180 tablet 0  . sildenafil (VIAGRA) 100 MG tablet Take 1 tablet (100 mg total) by mouth daily as needed for erectile dysfunction. 3 tablet 11  . TRUEPLUS LANCETS 30G MISC USE AS DIRECTED 100 each 11   No current facility-administered medications on file prior to visit.     Social History   Tobacco Use  . Smoking status: Never Smoker  . Smokeless tobacco: Never Used  Substance Use Topics  . Alcohol use: Yes    Comment: ocassionally  . Drug use: No    Review of Systems  Constitutional: Positive for unexpected weight change. Negative for chills, fatigue and fever.  HENT: Negative for congestion.   Respiratory: Negative for cough.   Cardiovascular: Negative for chest pain, palpitations and leg swelling.  Gastrointestinal: Negative for diarrhea, nausea and vomiting.  Endocrine: Negative for cold intolerance and heat intolerance.  Genitourinary: Negative for difficulty urinating, testicular pain and urgency.  Musculoskeletal: Negative for myalgias.  Skin: Negative for rash.  Neurological: Negative for headaches.  Hematological: Negative for adenopathy.  Psychiatric/Behavioral: Negative for confusion.      Objective:    BP 126/82 (BP Location: Left Arm, Patient Position: Sitting, Cuff Size: Normal)   Pulse 90   Temp 98.1 F (36.7 C) (Oral)   Ht 5\' 8"  (1.727 m)   Wt 150 lb 8 oz (68.3 kg)   SpO2 98%   BMI 22.88 kg/m   BP Readings from Last 3 Encounters:  12/31/17 126/82  11/28/17 108/72  09/27/17 126/64   Wt Readings from Last 3 Encounters:  12/31/17 150 lb 8 oz (68.3 kg)  11/28/17 152 lb (68.9 kg)  09/27/17 154 lb 6.4 oz (70 kg)    Physical Exam  Constitutional: He appears well-developed  and well-nourished.  Neck: No thyroid mass and no thyromegaly present.  Cardiovascular: Regular rhythm and normal heart sounds.  No palpable cords or masses. No asymmetry in calf size when compared bilaterally LE hair growth symmetric and present.  LE warm and palpable pedal pulses.   Pulmonary/Chest: Effort normal and breath sounds normal. No respiratory distress. He has no wheezes. He has no rhonchi. He has no rales.  Genitourinary: Prostate is not enlarged and not tender.  Genitourinary Comments: Prostate exam performed. No asymmetry of lobe, masses, or bogginess appreciated. Nontender.  Musculoskeletal:       Left foot: There is swelling. There is normal range of motion, no tenderness and no bony tenderness.       Feet:  Trace swelling dorsal aspect of foot as noted on diagram. No increased warmth. Slight localized erythema. Skin intact. No purulent discharge.     Lymphadenopathy:       Head (right side): No submental, no submandibular, no tonsillar, no preauricular, no posterior auricular and no occipital adenopathy present.       Head (left side): No submental, no submandibular, no tonsillar, no preauricular, no posterior auricular and no occipital adenopathy present.    He has no cervical adenopathy.    He has no axillary adenopathy.  Neurological: He is alert.  Skin: Skin is warm and dry.  Psychiatric: He has a normal mood and affect. His speech is normal and behavior is normal.  Vitals reviewed.      Assessment & Plan:   Problem List Items Addressed This Visit      Endocrine   Type 2 diabetes mellitus with hyperglycemia, without long-term current use of insulin (HCC)    Improved. Following with endocrine.  Will follow        Other   Routine physical examination - Primary    Colonoscopy ordered.  Prostate exam performed.  Screening labs ordered.      Relevant Orders   Comprehensive metabolic panel   Lipid panel   TSH   PSA   HIV antibody   Ambulatory referral  to Gastroenterology   Weight loss    Etiology unclear. Suspect hyperglycemia and medications ( ? Jardiance) contributory.  Had been 147 04/2017 so increase from that standpoint which is reassuring. Will continue to follow.       Relevant Orders   TSH   History of vitamin D deficiency   Relevant Orders   VITAMIN D 25 Hydroxy (Vit-D Deficiency, Fractures)   Insect bite    Improving symptomatically.  No evidence of infection at this time.  Patient will stay vigilant and let me know if it does not continue to improve.          I have discontinued Tami Lin. Reish's predniSONE. I am also having him maintain his  acetaminophen, sildenafil, TRUEPLUS LANCETS 30G, BLOOD GLUCOSE TEST STRIPS, benzonatate, glipiZIDE, metFORMIN, and Dulaglutide.   No orders of the defined types were placed in this encounter.   Return precautions given.   Risks, benefits, and alternatives of the medications and treatment plan prescribed today were discussed, and patient expressed understanding.   Education regarding symptom management and diagnosis given to patient on AVS.   Continue to follow with Burnard Hawthorne, FNP for routine health maintenance.   Jim [name] and I agreed with plan.   Mable Paris, FNP

## 2017-12-31 NOTE — Patient Instructions (Signed)
Labs today  Today we discussed referrals, orders. COLONOSCOPY   I have placed these orders in the system for you.  Please be sure to give [place] a call if you have not heard from our office regarding scheduling a test or regarding referral in a timely manner.  It is very important that you let me know as soon as possible.      Health Maintenance, Male A healthy lifestyle and preventive care is important for your health and wellness. Ask your health care provider about what schedule of regular examinations is right for you. What should I know about weight and diet? Eat a Healthy Diet  Eat plenty of vegetables, fruits, whole grains, low-fat dairy products, and lean protein.  Do not eat a lot of foods high in solid fats, added sugars, or salt.  Maintain a Healthy Weight Regular exercise can help you achieve or maintain a healthy weight. You should:  Do at least 150 minutes of exercise each week. The exercise should increase your heart rate and make you sweat (moderate-intensity exercise).  Do strength-training exercises at least twice a week.  Watch Your Levels of Cholesterol and Blood Lipids  Have your blood tested for lipids and cholesterol every 5 years starting at 50 years of age. If you are at high risk for heart disease, you should start having your blood tested when you are 50 years old. You may need to have your cholesterol levels checked more often if: ? Your lipid or cholesterol levels are high. ? You are older than 50 years of age. ? You are at high risk for heart disease.  What should I know about cancer screening? Many types of cancers can be detected early and may often be prevented. Lung Cancer  You should be screened every year for lung cancer if: ? You are a current smoker who has smoked for at least 30 years. ? You are a former smoker who has quit within the past 15 years.  Talk to your health care provider about your screening options, when you should start  screening, and how often you should be screened.  Colorectal Cancer  Routine colorectal cancer screening usually begins at 50 years of age and should be repeated every 5-10 years until you are 50 years old. You may need to be screened more often if early forms of precancerous polyps or small growths are found. Your health care provider may recommend screening at an earlier age if you have risk factors for colon cancer.  Your health care provider may recommend using home test kits to check for hidden blood in the stool.  A small camera at the end of a tube can be used to examine your colon (sigmoidoscopy or colonoscopy). This checks for the earliest forms of colorectal cancer.  Prostate and Testicular Cancer  Depending on your age and overall health, your health care provider may do certain tests to screen for prostate and testicular cancer.  Talk to your health care provider about any symptoms or concerns you have about testicular or prostate cancer.  Skin Cancer  Check your skin from head to toe regularly.  Tell your health care provider about any new moles or changes in moles, especially if: ? There is a change in a mole's size, shape, or color. ? You have a mole that is larger than a pencil eraser.  Always use sunscreen. Apply sunscreen liberally and repeat throughout the day.  Protect yourself by wearing long sleeves, pants, a wide-brimmed hat,  and sunglasses when outside.  What should I know about heart disease, diabetes, and high blood pressure?  If you are 27-27 years of age, have your blood pressure checked every 3-5 years. If you are 68 years of age or older, have your blood pressure checked every year. You should have your blood pressure measured twice-once when you are at a hospital or clinic, and once when you are not at a hospital or clinic. Record the average of the two measurements. To check your blood pressure when you are not at a hospital or clinic, you can use: ? An  automated blood pressure machine at a pharmacy. ? A home blood pressure monitor.  Talk to your health care provider about your target blood pressure.  If you are between 25-73 years old, ask your health care provider if you should take aspirin to prevent heart disease.  Have regular diabetes screenings by checking your fasting blood sugar level. ? If you are at a normal weight and have a low risk for diabetes, have this test once every three years after the age of 43. ? If you are overweight and have a high risk for diabetes, consider being tested at a younger age or more often.  A one-time screening for abdominal aortic aneurysm (AAA) by ultrasound is recommended for men aged 61-75 years who are current or former smokers. What should I know about preventing infection? Hepatitis B If you have a higher risk for hepatitis B, you should be screened for this virus. Talk with your health care provider to find out if you are at risk for hepatitis B infection. Hepatitis C Blood testing is recommended for:  Everyone born from [birth year] through 1963.  Anyone with known risk factors for hepatitis C.  Sexually Transmitted Diseases (STDs)  You should be screened each year for STDs including gonorrhea and chlamydia if: ? You are sexually active and are younger than 50 years of age. ? You are older than 50 years of age and your health care provider tells you that you are at risk for this type of infection. ? Your sexual activity has changed since you were last screened and you are at an increased risk for chlamydia or gonorrhea. Ask your health care provider if you are at risk.  Talk with your health care provider about whether you are at high risk of being infected with HIV. Your health care provider may recommend a prescription medicine to help prevent HIV infection.  What else can I do?  Schedule regular health, dental, and eye exams.  Stay current with your vaccines (immunizations).  Do not use  any tobacco products, such as cigarettes, chewing tobacco, and e-cigarettes. If you need help quitting, ask your health care provider.  Limit alcohol intake to no more than 2 drinks per day. One drink equals 12 ounces of beer, 5 ounces of wine, or 1 ounces of hard liquor.  Do not use street drugs.  Do not share needles.  Ask your health care provider for help if you need support or information about quitting drugs.  Tell your health care provider if you often feel depressed.  Tell your health care provider if you have ever been abused or do not feel safe at home. This information is not intended to replace advice given to you by your health care provider. Make sure you discuss any questions you have with your health care provider. Document Released: 12/30/2007 Document Revised: 03/01/2016 Document Reviewed: 04/06/2015 Elsevier Interactive Patient Education  2018 Elsevier Inc[employer]  

## 2017-12-31 NOTE — Assessment & Plan Note (Signed)
Improved. Following with endocrine.  Will follow

## 2017-12-31 NOTE — Assessment & Plan Note (Signed)
Colonoscopy ordered.  Prostate exam performed.  Screening labs ordered.

## 2017-12-31 NOTE — Assessment & Plan Note (Addendum)
Etiology unclear. Suspect hyperglycemia and medications ( ? Jardiance) contributory.  Had been 147 04/2017 so increase from that standpoint which is reassuring. Will continue to follow.

## 2018-01-01 LAB — HIV ANTIBODY (ROUTINE TESTING W REFLEX): HIV: NONREACTIVE

## 2018-01-16 ENCOUNTER — Other Ambulatory Visit: Payer: Self-pay | Admitting: Internal Medicine

## 2018-01-28 MED FILL — TRULICITY 1.5 MG/0.5 ML PEN: 1.5 | 28 days supply | Qty: 2 | Fill #0

## 2018-02-21 DIAGNOSIS — E1010 Type 1 diabetes mellitus with ketoacidosis without coma: Secondary | ICD-10-CM | POA: Diagnosis not present

## 2018-02-21 DIAGNOSIS — H43399 Other vitreous opacities, unspecified eye: Secondary | ICD-10-CM | POA: Diagnosis not present

## 2018-02-21 LAB — HM DIABETES EYE EXAM

## 2018-03-06 ENCOUNTER — Other Ambulatory Visit: Payer: Self-pay | Admitting: Family

## 2018-03-06 MED FILL — metFORMIN HCL ER 750 MG TB2: 750 | 90 days supply | Qty: 180 | Fill #0

## 2018-03-06 MED FILL — TRULICITY 1.5 MG/0.5 ML PEN: 1.5 | 28 days supply | Qty: 2 | Fill #1

## 2018-04-03 ENCOUNTER — Ambulatory Visit (INDEPENDENT_AMBULATORY_CARE_PROVIDER_SITE_OTHER): Payer: 59 | Admitting: Internal Medicine

## 2018-04-03 ENCOUNTER — Encounter: Payer: Self-pay | Admitting: Internal Medicine

## 2018-04-03 VITALS — BP 118/70 | HR 112 | Ht 68.0 in | Wt 151.4 lb

## 2018-04-03 DIAGNOSIS — E785 Hyperlipidemia, unspecified: Secondary | ICD-10-CM | POA: Diagnosis not present

## 2018-04-03 DIAGNOSIS — R634 Abnormal weight loss: Secondary | ICD-10-CM | POA: Diagnosis not present

## 2018-04-03 DIAGNOSIS — E1165 Type 2 diabetes mellitus with hyperglycemia: Secondary | ICD-10-CM | POA: Diagnosis not present

## 2018-04-03 LAB — POCT GLYCOSYLATED HEMOGLOBIN (HGB A1C): Hemoglobin A1C: 6.8 % — AB (ref 4.0–5.6)

## 2018-04-03 MED ORDER — GLIPIZIDE 5 MG PO TABS: 5.0000 mg | ORAL_TABLET | Freq: Two times a day (BID) | ORAL | 3 refills | Status: DC

## 2018-04-03 MED ORDER — METFORMIN HCL ER 750 MG PO TB24: 750.0000 mg | ORAL_TABLET | Freq: Two times a day (BID) | ORAL | 3 refills | Status: DC

## 2018-04-03 MED ORDER — DULAGLUTIDE 1.5 MG/0.5ML ~~LOC~~ SOAJ: SUBCUTANEOUS | 3 refills | Status: DC

## 2018-04-03 MED FILL — glipiZIDE 5 MG TABS: 5 | 90 days supply | Qty: 180 | Fill #0

## 2018-04-03 MED FILL — TRULICITY 1.5 MG/0.5 ML PEN: 1.5 | 84 days supply | Qty: 6 | Fill #0

## 2018-04-03 NOTE — Addendum Note (Signed)
Addended by: Cardell Peach I on: 04/03/2018 09:30 AM   Modules accepted: Orders

## 2018-04-03 NOTE — Progress Notes (Signed)
Patient ID: Jim [name], male   DOB: 1966-05-01, 52 y.o.   MRN: [id number]   HPI: Jim [name] is a 52 y.o.-year-old male, returning for follow-up for DM2, dx in 02/2011, non-insulin-dependent, uncontrolled, without long term complications.  Last visit 4 months ago.  He is ready to change jobs soon >> 8-5 pm, weekends off.  Last hemoglobin A1c was: Lab Results  Component Value Date   HGBA1C 7.2 11/28/2017   HGBA1C 7.4 08/31/2017   HGBA1C 8.9 (H) 04/16/2017   Pt is on a regimen of:  Metformin ER 750 2x a day with meals  Glipizide 5 mg 30 minutes before breakfast and dinner  Trulicity 1.5 mg weekly -started 08/2017-he tolerates this well He was on Jardiance 10 mg daily in am - started 11/2016 >> lost weight (15 lbs) and sugars were higher >> stopped 04/2017 He was on insulin right after dx.  He r/o for DM1 at last visit: Component     Latest Ref Rng & Units 11/28/2017  C-Peptide     0.80 - 3.85 ng/mL 1.97  Glucose, Plasma     65 - 99 mg/dL 153 (H)  Glutamic Acid Decarb Ab     <5 IU/mL <5  ZNT8 Antibodies     U/mL <15  Islet Cell Ab     Neg:<1:1 Negative   Pt checks his sugars seldom since last visit: - am: 89, 134-162, 204 >> 127, 136, 169 - 2h after b'fast: n/c >> 264 (today) >> n/c - before lunch: 100 >> n/c - 2h after lunch: n/c >> 180-190s >> n/c  - before dinner:  n/c >> 107-131 >> 104, 119 - 2h after dinner: n/c - bedtime: n/c - nighttime: n/c Lowest sugar was 90s >> 89 >> 89-90; he has hypoglycemia awareness in the 90s. Highest sugar was 200s >> 316 when sick >> 169  Glucometer: TruePlus  Pt's meals are: - Breakfast: bowl cereals, 1/2 bagel + cream cheese, coffee - Lunch: sandwich/sub/burrito, half and half sweet tea - Dinner: chicken or fish + veggies + starches - Snacks: protein bar if needed in am; may have cereal bowl after dinner  -No CKD, last BUN/creatinine:  Lab Results  Component Value Date   BUN 23 12/31/2017   BUN 19 04/16/2017    CREATININE 1.00 12/31/2017   CREATININE 0.76 04/16/2017  Not on ACE inhibitor/ARB -+ Dyslipidemia; last set of lipids: Lab Results  Component Value Date   CHOL 157 12/31/2017   HDL 42.00 12/31/2017   LDLCALC 86 12/31/2017   LDLDIRECT 90.0 04/30/2015   TRIG 145.0 12/31/2017   CHOLHDL 4 12/31/2017  Not on a statin. - last eye exam was on 02/21/2018: No DR - Now numbness and tingling in his feet.   Pt has FH of DM in older brother, nephew (DM1).  ROS: Constitutional: no weight gain/no weight loss, no fatigue, no subjective hyperthermia, no subjective hypothermia Eyes: no blurry vision, no xerophthalmia ENT: no sore throat, no nodules palpated in throat, no dysphagia, no odynophagia, no hoarseness Cardiovascular: no CP/no SOB/no palpitations/no leg swelling Respiratory: no cough/no SOB/no wheezing Gastrointestinal: no N/no V/no D/no C/no acid reflux Musculoskeletal: no muscle aches/no joint aches Skin: no rashes, no hair loss Neurological: no tremors/no numbness/no tingling/no dizziness  I reviewed pt's medications, allergies, PMH, social hx, family hx, and changes were documented in the history of present illness. Otherwise, unchanged from my initial visit note.  Past Medical History:  Diagnosis Date  . Diabetes mellitus 2012  . GERD (  gastroesophageal reflux disease)    Past Surgical History:  Procedure Laterality Date  . CLOSED REDUCTION ANKLE FRACTURE     Social History   Socioeconomic History  . Marital status: Married    Spouse name: Not on file  . Number of children: 2  Social Needs  Occupational History  .  [employer]  Tobacco Use  . Smoking status: Never Smoker  . Smokeless tobacco: Never Used  Substance and Sexual Activity  . Alcohol use: Yes    Comment: ocassionally, less than 1 drink per week, wine beer  . Drug use: No  Social History Narrative   Works for [employer]   . Lives in [place]. 2 children, daughter 31YO and son 11YO    Current Outpatient Medications on File Prior to Visit  Medication Sig Dispense Refill  . acetaminophen (TYLENOL) 500 MG tablet Take 500 mg by mouth every 6 (six) hours as needed.    . benzonatate (TESSALON) 200 MG capsule Take 1 capsule (200 mg total) by mouth 2 (two) times daily as needed for cough. 20 capsule 0  . Dulaglutide (TRULICITY) 1.5 RD/4.0CX SOPN INJECT 1 PEN UNDER THE SKIN WEEKLY IN THE MORNING 12 pen 3  . glipiZIDE (GLUCOTROL) 5 MG tablet Take 1 tablet (5 mg total) by mouth 2 (two) times daily before a meal. 180 tablet 1  . Glucose Blood (BLOOD GLUCOSE TEST STRIPS) STRP True Result test strips, to test blood glucose 3 times daily, use as directed 100 each 5  . metFORMIN (GLUCOPHAGE-XR) 750 MG 24 hr tablet TAKE 1 TABLET BY MOUTH 2 TIMES DAILY. 180 tablet 0  . sildenafil (VIAGRA) 100 MG tablet Take 1 tablet (100 mg total) by mouth daily as needed for erectile dysfunction. 3 tablet 11  . TRUEPLUS LANCETS 30G MISC USE AS DIRECTED 100 each 11  . TRULICITY 1.5 KG/8.1EH SOPN INJECT UNDER THE SKIN WEEKLY IN THE MORNING 2 mL 1   No current facility-administered medications on file prior to visit.    Allergies  Allergen Reactions  . Ciprofloxacin Rash   Family History  Problem Relation Age of Onset  . Stroke Paternal Grandfather   . Diabetes Brother   . Parkinsonism Maternal Grandfather     PE: BP 118/70   Pulse (!) 112   Ht 5\' 8"  (1.727 m)   Wt 151 lb 6.4 oz (68.7 kg)   SpO2 98%   BMI 23.02 kg/m  Wt Readings from Last 3 Encounters:  04/03/18 151 lb 6.4 oz (68.7 kg)  12/31/17 150 lb 8 oz (68.3 kg)  11/28/17 152 lb (68.9 kg)   Constitutional: Normal weight, in NAD Eyes: PERRLA, EOMI, no exophthalmos ENT: moist mucous membranes, no thyromegaly, no cervical lymphadenopathy Cardiovascular: tachycardia, RR, No MRG Respiratory: CTA B Gastrointestinal: abdomen soft, NT, ND, BS+ Musculoskeletal: no deformities, strength intact in all 4 Skin: moist, warm, no  rashes Neurological: no tremor with outstretched hands, DTR normal in all 4  ASSESSMENT: 1. DM2, non-insulin-dependent, uncontrolled, without long-term complications, but with hyperglycemia  2.  Dyslipidemia  3.  Weight loss  PLAN:  1. Patient with long-standing, uncontrolled, type 2 diabetes, on oral antidiabetic regimen, and now also on Trulicity.  At last visit, sugars overall were better but slightly high in the morning.  We did not change his regimen at last visit, but we discussed again about healthier meals.  We also checked him for type 1 diabetes but the results were negative.  We reviewed together these labs today. -  At this visit, sugars are at goal later in the day, but occasionally higher in the morning.  He continues to have late dinners and usually sugars are higher in the morning if this happens.  However, he is planning to change his job to an 8-5 job and is planning to move his dinners earlier.  He will also have the weekends off, while now he is working 6 out of 7 days.  He will also have scheduled time for lunch after he changes his job.  He feels that his sugars will continue to improve even more soon. - I suggested to:  Patient Instructions  Please continue:  Metformin ER 750 2x a day with meals  Glipizide 5 mg 30 minutes before breakfast and dinner  Trulicity 1.5 mg weekly  Please return in 4 months with your sugar log.   - today, HbA1c is 6.8% (better) - continue checking sugars at different times of the day - check 1x a day, rotating checks - advised for yearly eye exams >> he is UTD - refuses flu shot today - Return to clinic in 4 mo with sugar log     2.  Dyslipidemia - Reviewed latest lipid panel from 12/2017: All fractions at goal, with LDL slightly improved Lab Results  Component Value Date   CHOL 157 12/31/2017   HDL 42.00 12/31/2017   LDLCALC 86 12/31/2017   LDLDIRECT 90.0 04/30/2015   TRIG 145.0 12/31/2017   CHOLHDL 4 12/31/2017  -He is not on  a statin. -At last visits, we discussed about improving his diet by reducing fatty foods: Meat, cheese, eggs  3.  Weight loss -He was losing a little too much weight before, especially after starting Trulicity.  However, his weight has stabilized since last visit.  Philemon Kingdom, MD PhD [facility] Endocrinology

## 2018-04-03 NOTE — Patient Instructions (Signed)
Please continue:  Metformin ER 750 2x a day with meals  Glipizide 5 mg 30 minutes before breakfast and dinner  Trulicity 1.5 mg weekly  Please return in 4 months with your sugar log.

## 2018-06-07 MED FILL — metFORMIN HCL ER 750 MG TB2: 750 | 90 days supply | Qty: 180 | Fill #0

## 2018-07-03 ENCOUNTER — Ambulatory Visit: Payer: 59 | Admitting: Family Medicine

## 2018-07-03 ENCOUNTER — Encounter: Payer: Self-pay | Admitting: Family Medicine

## 2018-07-03 VITALS — BP 130/70 | HR 106 | Ht 68.0 in | Wt 156.0 lb

## 2018-07-03 DIAGNOSIS — M5416 Radiculopathy, lumbar region: Secondary | ICD-10-CM

## 2018-07-03 DIAGNOSIS — M999 Biomechanical lesion, unspecified: Secondary | ICD-10-CM

## 2018-07-03 MED ORDER — PREDNISONE 50 MG PO TABS: 50.0000 mg | ORAL_TABLET | Freq: Every day | ORAL | 0 refills | Status: DC

## 2018-07-03 MED FILL — predniSONE 50 MG TABS: 50 | 5 days supply | Qty: 5 | Fill #0

## 2018-07-03 NOTE — Progress Notes (Signed)
[name] [address], [place] [address] Phone: [phone] Subjective:    I [name] am serving as a Education administrator for Dr. Hulan Saas.   I'm seeing this patient by the request  of:    CC: Neck pain follow-up  WEX:[id number]  Jim [name] is a 52 y.o. male coming in with complaint of back pain. Pain radiates to left hip. No numbness and tingling noted.   Onset- 1 month Location- lower back Duration-  Character- sharp, sore, tight Aggravating factors- sitting for long periods of time Reliving factors- advil, heating pad  Therapies tried-  Severity-5-10 but worsening   Patient has been seen before and has had radicular symptoms.  Has responded well to manipulation.  It has been sometime since we have seen him.  Not taking any gabapentin which seemed to do well previously.  Past Medical History:  Diagnosis Date  . Diabetes mellitus 2012  . GERD (gastroesophageal reflux disease)    Past Surgical History:  Procedure Laterality Date  . CLOSED REDUCTION ANKLE FRACTURE     Social History   Socioeconomic History  . Marital status: Married    Spouse name: Not on file  . Number of children: Not on file  . Years of education: Not on file  . Highest education level: Not on file  Occupational History  . Not on file  Social Needs  . Financial resource strain: Not on file  . Food insecurity:    Worry: Not on file    Inability: Not on file  . Transportation needs:    Medical: Not on file    Non-medical: Not on file  Tobacco Use  . Smoking status: Never Smoker  . Smokeless tobacco: Never Used  Substance and Sexual Activity  . Alcohol use: Yes    Comment: ocassionally  . Drug use: No  . Sexual activity: Not on file  Lifestyle  . Physical activity:    Days per week: Not on file    Minutes per session: Not on file  . Stress: Not on file  Relationships  . Social connections:    Talks on phone: Not on file    Gets together:  Not on file    Attends religious service: Not on file    Active member of club or organization: Not on file    Attends meetings of clubs or organizations: Not on file    Relationship status: Not on file  Other Topics Concern  . Not on file  Social History Narrative   Works for [employer]   . Lives in [place]. 2 children, daughter 15YO and son 11YO   Allergies  Allergen Reactions  . Ciprofloxacin Rash   Family History  Problem Relation Age of Onset  . Stroke Paternal Grandfather   . Diabetes Brother   . Parkinsonism Maternal Grandfather     Current Outpatient Medications (Endocrine & Metabolic):  Marland Kitchen  Dulaglutide (TRULICITY) 1.5 FY/1.0FB SOPN, INJECT 1 PEN UNDER THE SKIN WEEKLY IN THE MORNING .  glipiZIDE (GLUCOTROL) 5 MG tablet, Take 1 tablet (5 mg total) by mouth 2 (two) times daily before a meal. .  metFORMIN (GLUCOPHAGE-XR) 750 MG 24 hr tablet, Take 1 tablet (750 mg total) by mouth 2 (two) times daily.  Current Outpatient Medications (Cardiovascular):  .  sildenafil (VIAGRA) 100 MG tablet, Take 1 tablet (100 mg total) by mouth daily as needed for erectile dysfunction.  Current Outpatient Medications (Respiratory):  .  benzonatate (  TESSALON) 200 MG capsule, Take 1 capsule (200 mg total) by mouth 2 (two) times daily as needed for cough.  Current Outpatient Medications (Analgesics):  .  acetaminophen (TYLENOL) 500 MG tablet, Take 500 mg by mouth every 6 (six) hours as needed.   Current Outpatient Medications (Other):  Marland Kitchen  Glucose Blood (BLOOD GLUCOSE TEST STRIPS) STRP, True Result test strips, to test blood glucose 3 times daily, use as directed .  TRUEPLUS LANCETS 30G MISC, USE AS DIRECTED    Past medical history, social, surgical and family history all reviewed in electronic medical record.  No pertanent information unless stated regarding to the chief complaint.   Review of Systems:  No headache, visual changes, nausea, vomiting, diarrhea, constipation,  dizziness, abdominal pain, skin rash, fevers, chills, night sweats, weight loss, swollen lymph nodes, body aches, joint swelling, muscle aches, chest pain, shortness of breath, mood changes.   Objective  There were no vitals taken for this visit. Systems examined below as of    General: No apparent distress alert and oriented x3 mood and affect normal, dressed appropriately.  HEENT: Pupils equal, extraocular movements intact  Respiratory: Patient's speak in full sentences and does not appear short of breath  Cardiovascular: No lower extremity edema, non tender, no erythema  Skin: Warm dry intact with no signs of infection or rash on extremities or on axial skeleton.  Abdomen: Soft nontender  Neuro: Cranial nerves II through XII are intact, neurovascularly intact in all extremities with 2+ DTRs and 2+ pulses.  Lymph: No lymphadenopathy of posterior or anterior cervical chain or axillae bilaterally.  Gait normal with good balance and coordination.  MSK:  Non tender with full range of motion and good stability and symmetric strength and tone of shoulders, elbows, wrist, hip, knee and ankles bilaterally.  Back Exam:  Inspection: Loss of lordosis Motion: Flexion 25 deg, Extension 15 deg, Side Bending to 45 deg bilaterally,  Rotation to 45 deg bilaterally  SLR laying: Tightness of hamstrings bilaterally XSLR laying: Negative  Palpable tenderness: Severe tenderness to palpation in the L5-S1 peer tightness musculature lumbar spine. FABER: Tightness noted. Sensory change: Gross sensation intact to all lumbar and sacral dermatomes.  Reflexes: 2+ at both patellar tendons, 2+ at achilles tendons, Babinski's downgoing.  Strength at foot  Plantar-flexion: 5/5 Dorsi-flexion: 5/5 Eversion: 5/5 Inversion: 5/5  Leg strength  Quad: 5/5 Hamstring: 5/5 Hip flexor: 5/5 Hip abductors: 5/5  Gait unremarkable.  Osteopathic findings C2 flexed rotated and side bent right T3 extended rotated and side bent  right inhaled third rib T5 extended rotated and side bent left L2 flexed rotated and side bent right Sacrum right on right    Impression and Recommendations:     This case required medical decision making of moderate complexity. The above documentation has been reviewed and is accurate and complete Lyndal Pulley, DO       Note: This dictation was prepared with Dragon dictation along with smaller phrase technology. Any transcriptional errors that result from this process are unintentional.

## 2018-07-03 NOTE — Patient Instructions (Addendum)
Good to see you  Ice is your friend Stay active prednisone only if you really need it See me again in 4 weeks

## 2018-07-03 NOTE — Assessment & Plan Note (Signed)
He has had radicular symptoms previously.  Seems to be doing relatively well at this time.  Discussed icing regimen and home exercises.  Discussed which activities he could be beneficial in which wants to avoid.  Follow-up with me again in 4 to 8 weeks.

## 2018-07-03 NOTE — Assessment & Plan Note (Signed)
Decision today to treat with OMT was based on Physical Exam  After verbal consent patient was treated with HVLA, ME, FPR techniques in cervical, thoracic, rib,  lumbar and sacral areas  Patient tolerated the procedure well with improvement in symptoms  Patient given exercises, stretches and lifestyle modifications  See medications in patient instructions if given  Patient will follow up in 4-8 weeks 

## 2018-07-29 MED FILL — TRULICITY 1.5 MG/0.5 ML PEN: 1.5 | 84 days supply | Qty: 6 | Fill #1

## 2018-07-29 MED FILL — glipiZIDE 5 MG TABS: 5 | 90 days supply | Qty: 180 | Fill #1

## 2018-07-30 NOTE — Progress Notes (Signed)
[name] [place], [place] [address] Phone: [phone] Subjective:    Jim [name], am serving as a scribe for Dr. Hulan Saas.  CC: Back and neck pain follow-up  [id number]  Jim [name] is a 53 y.o. male coming in with complaint of back and neck pain. Has had increase in pain in the last week. Was worse one month ago.  Has had a history of lumbar radiculopathy previously.  [name] weakness.  States that the pain is considerably less than what it has been in the past though.  Patient is to stop when to have the severe amount of pain     Past Medical History:  Diagnosis Date  . Diabetes mellitus 2012  . GERD (gastroesophageal reflux disease)    Past Surgical History:  Procedure Laterality Date  . CLOSED REDUCTION ANKLE FRACTURE     Social History   Socioeconomic History  . Marital status: Married    Spouse name: Not on file  . Number of children: Not on file  . Years of education: Not on file  . Highest education level: Not on file  Occupational History  . Not on file  Social Needs  . Financial resource strain: Not on file  . Food insecurity:    Worry: Not on file    Inability: Not on file  . Transportation needs:    Medical: Not on file    Non-medical: Not on file  Tobacco Use  . Smoking status: Never Smoker  . Smokeless tobacco: Never Used  Substance and Sexual Activity  . Alcohol use: Yes    Comment: ocassionally  . Drug use: [name]  . Sexual activity: Not on file  Lifestyle  . Physical activity:    Days per week: Not on file    Minutes per session: Not on file  . Stress: Not on file  Relationships  . Social connections:    Talks on phone: Not on file    Gets together: Not on file    Attends religious service: Not on file    Active member of club or organization: Not on file    Attends meetings of clubs or organizations: Not on file    Relationship status: Not on file  Other Topics Concern  . Not on  file  Social History Narrative   Works for [employer]   . Lives in [place]. 2 children, daughter 15YO and son 11YO   Allergies  Allergen Reactions  . Ciprofloxacin Rash   Family History  Problem Relation Age of Onset  . Stroke Paternal Grandfather   . Diabetes Brother   . Parkinsonism Maternal Grandfather     Current Outpatient Medications (Endocrine & Metabolic):  Marland Kitchen  Dulaglutide (TRULICITY) 1.5 FW/2.6VZ SOPN, INJECT 1 PEN UNDER THE SKIN WEEKLY IN THE MORNING .  glipiZIDE (GLUCOTROL) 5 MG tablet, Take 1 tablet (5 mg total) by mouth 2 (two) times daily before a meal. .  metFORMIN (GLUCOPHAGE-XR) 750 MG 24 hr tablet, Take 1 tablet (750 mg total) by mouth 2 (two) times daily. .  predniSONE (DELTASONE) 50 MG tablet, Take 1 tablet (50 mg total) by mouth daily.  Current Outpatient Medications (Cardiovascular):  .  sildenafil (VIAGRA) 100 MG tablet, Take 1 tablet (100 mg total) by mouth daily as needed for erectile dysfunction.  Current Outpatient Medications (Respiratory):  .  benzonatate (TESSALON) 200 MG capsule, Take 1 capsule (200 mg total) by mouth 2 (two) times daily as  needed for cough.  Current Outpatient Medications (Analgesics):  .  acetaminophen (TYLENOL) 500 MG tablet, Take 500 mg by mouth every 6 (six) hours as needed.   Current Outpatient Medications (Other):  Marland Kitchen  Glucose Blood (BLOOD GLUCOSE TEST STRIPS) STRP, True Result test strips, to test blood glucose 3 times daily, use as directed .  TRUEPLUS LANCETS 30G MISC, USE AS DIRECTED    Past medical history, social, surgical and family history all reviewed in electronic medical record.  [name] pertanent information unless stated regarding to the chief complaint.   Review of Systems:  [name] headache, visual changes, nausea, vomiting, diarrhea, constipation, dizziness, abdominal pain, skin rash, fevers, chills, night sweats, weight loss, swollen lymph nodes, body aches, joint swelling, muscle aches, chest pain,  shortness of breath, mood changes.   Objective  There were [name] vitals taken for this visit. Systems examined below as of    General: [name] apparent distress alert and oriented x3 mood and affect normal, dressed appropriately.  HEENT: Pupils equal, extraocular movements intact  Respiratory: Patient's speak in full sentences and does not appear short of breath  Cardiovascular: [name] lower extremity edema, non tender, [name] erythema  Skin: Warm dry intact with [name] signs of infection or rash on extremities or on axial skeleton.  Abdomen: Soft nontender  Neuro: Cranial nerves II through XII are intact, neurovascularly intact in all extremities with 2+ DTRs and 2+ pulses.  Lymph: [name] lymphadenopathy of posterior or anterior cervical chain or axillae bilaterally.  Gait normal with good balance and coordination.  MSK:  Non tender with full range of motion and good stability and symmetric strength and tone of shoulders, elbows, wrist, hip, knee and ankles bilaterally.  Back Exam:  Inspection: Unremarkable  Motion: Flexion 45 deg, Extension 30 deg, Side Bending to 35 deg bilaterally,  Rotation to 40 deg bilaterally  SLR laying: Negative  XSLR laying: Negative  Palpable tenderness: Tender to palpation of the paraspinal musculature lumbar spine. FABER: Tightness bilaterally. Sensory change: Gross sensation intact to all lumbar and sacral dermatomes.  Reflexes: 2+ at both patellar tendons, 2+ at achilles tendons, Babinski's downgoing.  Strength at foot  Plantar-flexion: 5/5 Dorsi-flexion: 5/5 Eversion: 5/5 Inversion: 5/5  Leg strength  Quad: 5/5 Hamstring: 5/5 Hip flexor: 5/5 Hip abductors: 5/5  Gait unremarkable.  Osteopathic findings  C7 flexed rotated and side bent left T3 extended rotated and side bent right inhaled third rib T5 extended rotated and side bent left L3 flexed rotated and side bent right Sacrum left on left    Impression and Recommendations:     This case required medical  decision making of moderate complexity. The above documentation has been reviewed and is accurate and complete Jim Pulley, DO       Note: This dictation was prepared with Dragon dictation along with smaller phrase technology. Any transcriptional errors that result from this process are unintentional.

## 2018-07-31 ENCOUNTER — Encounter: Payer: Self-pay | Admitting: Family Medicine

## 2018-07-31 ENCOUNTER — Ambulatory Visit: Payer: 59 | Admitting: Family Medicine

## 2018-07-31 VITALS — BP 106/74 | HR 100 | Ht 68.0 in | Wt 152.0 lb

## 2018-07-31 DIAGNOSIS — M999 Biomechanical lesion, unspecified: Secondary | ICD-10-CM | POA: Diagnosis not present

## 2018-07-31 DIAGNOSIS — M5416 Radiculopathy, lumbar region: Secondary | ICD-10-CM

## 2018-07-31 NOTE — Patient Instructions (Signed)
Happy New Year!  Overall not terrible  Work in Hospital doctor 3 times a week.  Stay active See me again in 4-6 weeks Travel safe

## 2018-07-31 NOTE — Assessment & Plan Note (Signed)
Decision today to treat with OMT was based on Physical Exam  After verbal consent patient was treated with HVLA, ME, FPR techniques in cervical, thoracic, lumbar and sacral areas  Patient tolerated the procedure well with improvement in symptoms  Patient given exercises, stretches and lifestyle modifications  See medications in patient instructions if given  Patient will follow up in 4-8 weeks 

## 2018-07-31 NOTE — Assessment & Plan Note (Signed)
No longer having true radicular symptoms.  Seems to be doing well overall.  Discussed icing regimen, continuing the once weekly vitamin D is a possibility.  Discussed topical anti-inflammatories core strengthening.  Follow-up again 4 to 8 weeks

## 2018-08-02 ENCOUNTER — Ambulatory Visit: Payer: 59 | Admitting: Family Medicine

## 2018-08-06 ENCOUNTER — Ambulatory Visit: Payer: 59 | Admitting: Internal Medicine

## 2018-08-22 DIAGNOSIS — H43399 Other vitreous opacities, unspecified eye: Secondary | ICD-10-CM | POA: Diagnosis not present

## 2018-08-22 DIAGNOSIS — E1010 Type 1 diabetes mellitus with ketoacidosis without coma: Secondary | ICD-10-CM | POA: Diagnosis not present

## 2018-08-22 LAB — HM DIABETES EYE EXAM

## 2018-09-10 NOTE — Progress Notes (Signed)
[name] [place], [place] [address] Phone: [phone] Subjective:   [name], am serving as a scribe for Dr. Hulan Saas.    CC: Back and neck pain follow-up  [id number]  Jim [name] is a 53 y.o. male coming in with complaint of back pain. Is having tightness on left lumbar region. Pain increased over the past 2 days but is doing better today.  Patient will be traveling though.  Not go to be able to do his exercises as regularly.  Somewhat concerned about that.  Wants to know somewhat abbreviated schedule at work as it can be helpful.    Past Medical History:  Diagnosis Date  . Diabetes mellitus 2012  . GERD (gastroesophageal reflux disease)    Past Surgical History:  Procedure Laterality Date  . CLOSED REDUCTION ANKLE FRACTURE     Social History   Socioeconomic History  . Marital status: Married    Spouse name: Not on file  . Number of children: Not on file  . Years of education: Not on file  . Highest education level: Not on file  Occupational History  . Not on file  Social Needs  . Financial resource strain: Not on file  . Food insecurity:    Worry: Not on file    Inability: Not on file  . Transportation needs:    Medical: Not on file    Non-medical: Not on file  Tobacco Use  . Smoking status: Never Smoker  . Smokeless tobacco: Never Used  Substance and Sexual Activity  . Alcohol use: Yes    Comment: ocassionally  . Drug use: No  . Sexual activity: Not on file  Lifestyle  . Physical activity:    Days per week: Not on file    Minutes per session: Not on file  . Stress: Not on file  Relationships  . Social connections:    Talks on phone: Not on file    Gets together: Not on file    Attends religious service: Not on file    Active member of club or organization: Not on file    Attends meetings of clubs or organizations: Not on file    Relationship status: Not on file  Other Topics Concern  .  Not on file  Social History Narrative   Works for [employer]   . Lives in [place]. 2 children, daughter 15YO and son 11YO   Allergies  Allergen Reactions  . Ciprofloxacin Rash   Family History  Problem Relation Age of Onset  . Stroke Paternal Grandfather   . Diabetes Brother   . Parkinsonism Maternal Grandfather     Current Outpatient Medications (Endocrine & Metabolic):  Marland Kitchen  Dulaglutide (TRULICITY) 1.5 DX/8.3JA SOPN, INJECT 1 PEN UNDER THE SKIN WEEKLY IN THE MORNING .  glipiZIDE (GLUCOTROL) 5 MG tablet, Take 1 tablet (5 mg total) by mouth 2 (two) times daily before a meal. .  metFORMIN (GLUCOPHAGE-XR) 750 MG 24 hr tablet, Take 1 tablet (750 mg total) by mouth 2 (two) times daily. .  predniSONE (DELTASONE) 50 MG tablet, Take 1 tablet (50 mg total) by mouth daily.  Current Outpatient Medications (Cardiovascular):  .  sildenafil (VIAGRA) 100 MG tablet, Take 1 tablet (100 mg total) by mouth daily as needed for erectile dysfunction.  Current Outpatient Medications (Respiratory):  .  benzonatate (TESSALON) 200 MG capsule, Take 1 capsule (200 mg total) by mouth 2 (two) times daily as needed for  cough.  Current Outpatient Medications (Analgesics):  .  acetaminophen (TYLENOL) 500 MG tablet, Take 500 mg by mouth every 6 (six) hours as needed.   Current Outpatient Medications (Other):  Marland Kitchen  Glucose Blood (BLOOD GLUCOSE TEST STRIPS) STRP, True Result test strips, to test blood glucose 3 times daily, use as directed .  TRUEPLUS LANCETS 30G MISC, USE AS DIRECTED    Past medical history, social, surgical and family history all reviewed in electronic medical record.  No pertanent information unless stated regarding to the chief complaint.   Review of Systems:  No headache, visual changes, nausea, vomiting, diarrhea, constipation, dizziness, abdominal pain, skin rash, fevers, chills, night sweats, weight loss, swollen lymph nodes, body aches, joint swelling, chest pain, shortness of  breath, mood changes.  Positive muscle aches  Objective  Blood pressure 118/76, pulse 95, height 5\' 8"  (1.727 m), weight 152 lb (68.9 kg), SpO2 97 %.    General: No apparent distress alert and oriented x3 mood and affect normal, dressed appropriately.  HEENT: Pupils equal, extraocular movements intact  Respiratory: Patient's speak in full sentences and does not appear short of breath  Cardiovascular: No lower extremity edema, non tender, no erythema  Skin: Warm dry intact with no signs of infection or rash on extremities or on axial skeleton.  Abdomen: Soft nontender  Neuro: Cranial nerves II through XII are intact, neurovascularly intact in all extremities with 2+ DTRs and 2+ pulses.  Lymph: No lymphadenopathy of posterior or anterior cervical chain or axillae bilaterally.  Gait normal with good balance and coordination.  MSK:  Non tender with full range of motion and good stability and symmetric strength and tone of shoulders, elbows, wrist, hip, knee and ankles bilaterally.  Neck: Inspection mild loss of lordosis. No palpable stepoffs. Negative Spurling's maneuver.  with less pain the last 5 degrees of sidebending bilaterally Grip strength and sensation normal in bilateral hands Strength good C4 to T1 distribution No sensory change to C4 to T1 Negative Hoffman sign bilaterally Reflexes normal Mild tightness of the right trapezius.  Lower back exam has some mild loss of lordosis but near full range of motion.  Tightness with Corky Sox test but negative straight leg test.  4+ out of 5 strength noted.  Osteopathic findings C2 flexed rotated and side bent right T7 extended rotated and side bent left L3 flexed rotated and side bent right Sacrum right on right    Impression and Recommendations:     This case required medical decision making of moderate complexity. The above documentation has been reviewed and is accurate and complete Lyndal Pulley, DO       Note: This  dictation was prepared with Dragon dictation along with smaller phrase technology. Any transcriptional errors that result from this process are unintentional.

## 2018-09-11 ENCOUNTER — Ambulatory Visit: Payer: 59 | Admitting: Family Medicine

## 2018-09-11 ENCOUNTER — Encounter: Payer: Self-pay | Admitting: Family Medicine

## 2018-09-11 VITALS — BP 118/76 | HR 95 | Ht 68.0 in | Wt 152.0 lb

## 2018-09-11 DIAGNOSIS — M999 Biomechanical lesion, unspecified: Secondary | ICD-10-CM

## 2018-09-11 DIAGNOSIS — M5416 Radiculopathy, lumbar region: Secondary | ICD-10-CM | POA: Diagnosis not present

## 2018-09-11 MED FILL — metFORMIN HCL ER 750 MG TB2: 750 | 90 days supply | Qty: 180 | Fill #1

## 2018-09-11 NOTE — Assessment & Plan Note (Signed)
Decision today to treat with OMT was based on Physical Exam  After verbal consent patient was treated with HVLA, ME, FPR techniques in cervical, thoracic, lumbar and sacral areas  Patient tolerated the procedure well with improvement in symptoms  Patient given exercises, stretches and lifestyle modifications  See medications in patient instructions if given  Patient will follow up in 4-6 weeks 

## 2018-09-11 NOTE — Patient Instructions (Signed)
Good o see you  You will do great  Keep working on the posture and do the exercises occasionally  See me again in 4-5 weeks

## 2018-09-11 NOTE — Assessment & Plan Note (Signed)
No radicular symptoms.  Discussed which activities to do which was to avoid.  Discussed avoiding certain activities.  Discussed posture and ergonomics.  Discussed hip abductor strengthening.  Patient will be traveling.  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  Follow-up with me again 4 to 6 weeks

## 2018-10-15 ENCOUNTER — Ambulatory Visit: Payer: 59 | Admitting: Family Medicine

## 2018-10-31 ENCOUNTER — Ambulatory Visit: Payer: 59 | Admitting: Family Medicine

## 2018-11-05 MED FILL — glipiZIDE 5 MG TABS: 5 | 90 days supply | Qty: 180 | Fill #2

## 2018-11-05 MED FILL — TRULICITY 1.5 MG/0.5 ML PEN: 1.5 | 84 days supply | Qty: 6 | Fill #2

## 2018-11-09 ENCOUNTER — Encounter: Payer: Self-pay | Admitting: Family

## 2018-11-20 NOTE — Progress Notes (Deleted)
[name] [place], [place] [address] Phone: [phone] Subjective:    I'm seeing this patient by the request  of:    CC: Neck and back pain follow-up  [id number]  Jim [name] is a 53 y.o. male coming in with complaint of ***  Onset-  Location Duration-  Character- Aggravating factors- Reliving factors-  Therapies tried-  Severity-     Past Medical History:  Diagnosis Date  . Diabetes mellitus 2012  . GERD (gastroesophageal reflux disease)    Past Surgical History:  Procedure Laterality Date  . CLOSED REDUCTION ANKLE FRACTURE     Social History   Socioeconomic History  . Marital status: Married    Spouse name: Not on file  . Number of children: Not on file  . Years of education: Not on file  . Highest education level: Not on file  Occupational History  . Not on file  Social Needs  . Financial resource strain: Not on file  . Food insecurity:    Worry: Not on file    Inability: Not on file  . Transportation needs:    Medical: Not on file    Non-medical: Not on file  Tobacco Use  . Smoking status: Never Smoker  . Smokeless tobacco: Never Used  Substance and Sexual Activity  . Alcohol use: Yes    Comment: ocassionally  . Drug use: No  . Sexual activity: Not on file  Lifestyle  . Physical activity:    Days per week: Not on file    Minutes per session: Not on file  . Stress: Not on file  Relationships  . Social connections:    Talks on phone: Not on file    Gets together: Not on file    Attends religious service: Not on file    Active member of club or organization: Not on file    Attends meetings of clubs or organizations: Not on file    Relationship status: Not on file  Other Topics Concern  . Not on file  Social History Narrative   Works for [employer]   . Lives in [place]. 2 children, daughter 15YO and son 11YO   Allergies  Allergen Reactions  . Ciprofloxacin Rash   Family  History  Problem Relation Age of Onset  . Stroke Paternal Grandfather   . Diabetes Brother   . Parkinsonism Maternal Grandfather     Current Outpatient Medications (Endocrine & Metabolic):  Marland Kitchen  Dulaglutide (TRULICITY) 1.5 RK/2.7CW SOPN, INJECT 1 PEN UNDER THE SKIN WEEKLY IN THE MORNING .  glipiZIDE (GLUCOTROL) 5 MG tablet, Take 1 tablet (5 mg total) by mouth 2 (two) times daily before a meal. .  metFORMIN (GLUCOPHAGE-XR) 750 MG 24 hr tablet, Take 1 tablet (750 mg total) by mouth 2 (two) times daily. .  predniSONE (DELTASONE) 50 MG tablet, Take 1 tablet (50 mg total) by mouth daily.  Current Outpatient Medications (Cardiovascular):  .  sildenafil (VIAGRA) 100 MG tablet, Take 1 tablet (100 mg total) by mouth daily as needed for erectile dysfunction.  Current Outpatient Medications (Respiratory):  .  benzonatate (TESSALON) 200 MG capsule, Take 1 capsule (200 mg total) by mouth 2 (two) times daily as needed for cough.  Current Outpatient Medications (Analgesics):  .  acetaminophen (TYLENOL) 500 MG tablet, Take 500 mg by mouth every 6 (six) hours as needed.   Current Outpatient Medications (Other):  Marland Kitchen  Glucose Blood (BLOOD GLUCOSE TEST STRIPS) STRP, True  Result test strips, to test blood glucose 3 times daily, use as directed .  TRUEPLUS LANCETS 30G MISC, USE AS DIRECTED    Past medical history, social, surgical and family history all reviewed in electronic medical record.  No pertanent information unless stated regarding to the chief complaint.   Review of Systems:  No headache, visual changes, nausea, vomiting, diarrhea, constipation, dizziness, abdominal pain, skin rash, fevers, chills, night sweats, weight loss, swollen lymph nodes, body aches, joint swelling, muscle aches, chest pain, shortness of breath, mood changes.   Objective  There were no vitals taken for this visit. Systems examined below as of    General: No apparent distress alert and oriented x3 mood and affect normal,  dressed appropriately.  HEENT: Pupils equal, extraocular movements intact  Respiratory: Patient's speak in full sentences and does not appear short of breath  Cardiovascular: No lower extremity edema, non tender, no erythema  Skin: Warm dry intact with no signs of infection or rash on extremities or on axial skeleton.  Abdomen: Soft nontender  Neuro: Cranial nerves II through XII are intact, neurovascularly intact in all extremities with 2+ DTRs and 2+ pulses.  Lymph: No lymphadenopathy of posterior or anterior cervical chain or axillae bilaterally.  Gait normal with good balance and coordination.  MSK:  Non tender with full range of motion and good stability and symmetric strength and tone of shoulders, elbows, wrist, hip, knee and ankles bilaterally.  Neck: Inspection unremarkable. No palpable stepoffs. Negative Spurling's maneuver. Full neck range of motion Grip strength and sensation normal in bilateral hands Strength good C4 to T1 distribution No sensory change to C4 to T1 Negative Hoffman sign bilaterally Reflexes normal  Back Exam:  Inspection: Unremarkable  Motion: Flexion 45 deg, Extension 45 deg, Side Bending to 45 deg bilaterally,  Rotation to 45 deg bilaterally  SLR laying: Negative  XSLR laying: Negative  Palpable tenderness: None. FABER: negative. Sensory change: Gross sensation intact to all lumbar and sacral dermatomes.  Reflexes: 2+ at both patellar tendons, 2+ at achilles tendons, Babinski's downgoing.  Strength at foot  Plantar-flexion: 5/5 Dorsi-flexion: 5/5 Eversion: 5/5 Inversion: 5/5  Leg strength  Quad: 5/5 Hamstring: 5/5 Hip flexor: 5/5 Hip abductors: 5/5  Gait unremarkable.   Impression and Recommendations:     This case required medical decision making of moderate complexity. The above documentation has been reviewed and is accurate and complete Lyndal Pulley, DO       Note: This dictation was prepared with Dragon dictation along with smaller  phrase technology. Any transcriptional errors that result from this process are unintentional.

## 2018-11-21 ENCOUNTER — Ambulatory Visit: Payer: 59 | Admitting: Family Medicine

## 2018-11-27 NOTE — Progress Notes (Signed)
[name] [address] [place], [place] [address] Phone: [phone] Subjective:   I Jim [name] am serving as a Education administrator for Dr. Hulan Saas.  I'm seeing this patient by the request  of:    CC: back pain  IWL:[id number]  Jim [name] is a 53 y.o. male coming in with complaint of back pain. States that his back pain is off and on.  Patient was last seen 10 weeks ago.  Has been doing relatively well overall though.  Some mild tightness recently.  Some increasing stress personal as well as with work.       Past Medical History:  Diagnosis Date  . Diabetes mellitus 2012  . GERD (gastroesophageal reflux disease)    Past Surgical History:  Procedure Laterality Date  . CLOSED REDUCTION ANKLE FRACTURE     Social History   Socioeconomic History  . Marital status: Married    Spouse name: Not on file  . Number of children: Not on file  . Years of education: Not on file  . Highest education level: Not on file  Occupational History  . Not on file  Social Needs  . Financial resource strain: Not on file  . Food insecurity:    Worry: Not on file    Inability: Not on file  . Transportation needs:    Medical: Not on file    Non-medical: Not on file  Tobacco Use  . Smoking status: Never Smoker  . Smokeless tobacco: Never Used  Substance and Sexual Activity  . Alcohol use: Yes    Comment: ocassionally  . Drug use: No  . Sexual activity: Not on file  Lifestyle  . Physical activity:    Days per week: Not on file    Minutes per session: Not on file  . Stress: Not on file  Relationships  . Social connections:    Talks on phone: Not on file    Gets together: Not on file    Attends religious service: Not on file    Active member of club or organization: Not on file    Attends meetings of clubs or organizations: Not on file    Relationship status: Not on file  Other Topics Concern  . Not on file  Social History Narrative   Works for [employer]   . Lives in [place]. 2 children, daughter 15YO and son 11YO   Allergies  Allergen Reactions  . Ciprofloxacin Rash   Family History  Problem Relation Age of Onset  . Stroke Paternal Grandfather   . Diabetes Brother   . Parkinsonism Maternal Grandfather     Current Outpatient Medications (Endocrine & Metabolic):  Marland Kitchen  Dulaglutide (TRULICITY) 1.5 DE/0.8XK SOPN, INJECT 1 PEN UNDER THE SKIN WEEKLY IN THE MORNING .  glipiZIDE (GLUCOTROL) 5 MG tablet, Take 1 tablet (5 mg total) by mouth 2 (two) times daily before a meal. .  metFORMIN (GLUCOPHAGE-XR) 750 MG 24 hr tablet, Take 1 tablet (750 mg total) by mouth 2 (two) times daily. .  predniSONE (DELTASONE) 50 MG tablet, Take 1 tablet (50 mg total) by mouth daily.  Current Outpatient Medications (Cardiovascular):  .  sildenafil (VIAGRA) 100 MG tablet, Take 1 tablet (100 mg total) by mouth daily as needed for erectile dysfunction.  Current Outpatient Medications (Respiratory):  .  benzonatate (TESSALON) 200 MG capsule, Take 1 capsule (200 mg total) by mouth 2 (two) times daily as needed for cough.  Current Outpatient Medications (Analgesics):  .  acetaminophen (TYLENOL) 500 MG tablet, Take 500 mg by mouth every 6 (six) hours as needed.   Current Outpatient Medications (Other):  Marland Kitchen  Glucose Blood (BLOOD GLUCOSE TEST STRIPS) STRP, True Result test strips, to test blood glucose 3 times daily, use as directed .  TRUEPLUS LANCETS 30G MISC, USE AS DIRECTED    Past medical history, social, surgical and family history all reviewed in electronic medical record.  No pertanent information unless stated regarding to the chief complaint.   Review of Systems:  No headache, visual changes, nausea, vomiting, diarrhea, constipation, dizziness, abdominal pain, skin rash, fevers, chills, night sweats, weight loss, swollen lymph nodes, body aches, joint swelling, muscle aches, chest pain, shortness of breath, mood changes.   Objective  Blood  pressure 100/64, pulse (!) 107, height 5\' 8"  (1.727 m), weight 153 lb (69.4 kg), SpO2 98 %. Sy   General: No apparent distress alert and oriented x3 mood and affect normal, dressed appropriately.  HEENT: Pupils equal, extraocular movements intact  Respiratory: Patient's speak in full sentences and does not appear short of breath  Cardiovascular: No lower extremity edema, non tender, no erythema  Skin: Warm dry intact with no signs of infection or rash on extremities or on axial skeleton.  Abdomen: Soft nontender  Neuro: Cranial nerves II through XII are intact, neurovascularly intact in all extremities with 2+ DTRs and 2+ pulses.  Lymph: No lymphadenopathy of posterior or anterior cervical chain or axillae bilaterally.  Gait normal with good balance and coordination.  MSK:  Non tender with full range of motion and good stability and symmetric strength and tone of shoulders, elbows, wrist, hip, knee and ankles bilaterally.  Back Exam:  Inspection: Loss of lordosis Motion: Flexion 45 deg, Extension 15 deg, Side Bending to 35 deg bilaterally,  Rotation to 45 deg bilaterally  SLR laying: Negative  XSLR laying: Negative  Palpable tenderness: Tender to palpation more over the sacroiliac joints bilaterally. FABER: Positive Faber. Sensory change: Gross sensation intact to all lumbar and sacral dermatomes.  Reflexes: 2+ at both patellar tendons, 2+ at achilles tendons, Babinski's downgoing.  Strength at foot  Plantar-flexion: 5/5 Dorsi-flexion: 5/5 Eversion: 5/5 Inversion: 5/5  Leg strength  Quad: 5/5 Hamstring: 5/5 Hip flexor: 5/5 Hip abductors: 5/5  Gait unremarkable.  Osteopathic findings  C2 flexed rotated and side bent right C6 flexed rotated and side bent left T3 extended rotated and side bent right inhaled third rib T9 extended rotated and side bent left L2 flexed rotated and side bent right Sacrum right on right    Impression and Recommendations:     This case required  medical decision making of moderate complexity. The above documentation has been reviewed and is accurate and complete Lyndal Pulley, DO       Note: This dictation was prepared with Dragon dictation along with smaller phrase technology. Any transcriptional errors that result from this process are unintentional.

## 2018-11-28 ENCOUNTER — Other Ambulatory Visit: Payer: Self-pay

## 2018-11-28 ENCOUNTER — Ambulatory Visit: Payer: 59 | Admitting: Family Medicine

## 2018-11-28 ENCOUNTER — Encounter: Payer: Self-pay | Admitting: Family Medicine

## 2018-11-28 VITALS — BP 100/64 | HR 107 | Ht 68.0 in | Wt 153.0 lb

## 2018-11-28 DIAGNOSIS — M5416 Radiculopathy, lumbar region: Secondary | ICD-10-CM | POA: Diagnosis not present

## 2018-11-28 DIAGNOSIS — M999 Biomechanical lesion, unspecified: Secondary | ICD-10-CM

## 2018-11-28 NOTE — Assessment & Plan Note (Signed)
Patient is not having any radicular symptoms in more of the sacroiliac joint.  We discussed with patient about icing regimen and home exercises.  Patient does have topical anti-inflammatories for any type of breakthrough.  Do not feel that prednisone is necessary at the time.  Responded well to manipulation.  Follow-up again 4 to 6 weeks

## 2018-11-28 NOTE — Patient Instructions (Signed)
Good to see you \ Please have Jim [name] call and I would like to look at the finger to be safe  Call [phone] whenever you guys need me.

## 2018-11-28 NOTE — Assessment & Plan Note (Signed)
Decision today to treat with OMT was based on Physical Exam  After verbal consent patient was treated with HVLA, ME, FPR techniques in cervical, thoracic, lumbar and sacral areas  Patient tolerated the procedure well with improvement in symptoms  Patient given exercises, stretches and lifestyle modifications  See medications in patient instructions if given  Patient will follow up in 4-6 weeks 

## 2018-12-25 MED FILL — metFORMIN HCL ER 750 MG TB2: 750 | 90 days supply | Qty: 180 | Fill #2

## 2019-02-13 MED FILL — glipiZIDE 5 MG TABS: 5 | 90 days supply | Qty: 180 | Fill #3

## 2019-02-13 MED FILL — TRULICITY 1.5 MG/0.5 ML PEN: 1.5 | 84 days supply | Qty: 6 | Fill #3

## 2019-04-04 ENCOUNTER — Other Ambulatory Visit: Payer: Self-pay | Admitting: Internal Medicine

## 2019-04-04 MED FILL — METFORMIN HCL ER 750 MG TAB: 750 | 30 days supply | Qty: 60 | Fill #0

## 2019-04-04 NOTE — Telephone Encounter (Signed)
Last office visit 04/03/2018  Cancel/No-show? one  Future office visit scheduled? none

## 2019-04-04 NOTE — Telephone Encounter (Signed)
RX sent.  Please contact patient to get scheduled per Dr. Cruzita Lederer.

## 2019-04-04 NOTE — Telephone Encounter (Signed)
Pt had an appt set for January 2021, he cancelled because he needs to call us back when he is back in the state, traveling for work for the next few months.   Called him today and this is still the case

## 2019-04-04 NOTE — Telephone Encounter (Signed)
OK to refill - will need appt within the next 3-4 mo - virtual is OK.

## 2019-05-07 MED FILL — METFORMIN HCL ER 750 MG TAB: 750 | 30 days supply | Qty: 60 | Fill #1

## 2019-06-02 ENCOUNTER — Encounter: Payer: Self-pay | Admitting: Family

## 2019-06-02 ENCOUNTER — Other Ambulatory Visit: Payer: Self-pay

## 2019-06-02 ENCOUNTER — Other Ambulatory Visit: Payer: Self-pay | Admitting: Internal Medicine

## 2019-06-02 NOTE — Telephone Encounter (Signed)
Last office visit 04/03/18  Cancel/No-show? one  Future office visit scheduled? no  Please advise on refill.

## 2019-06-02 NOTE — Telephone Encounter (Signed)
He was lost for f/u with me. Refills per PCP

## 2019-06-04 ENCOUNTER — Telehealth: Payer: Self-pay | Admitting: Internal Medicine

## 2019-06-04 MED ORDER — TRULICITY 1.5 MG/0.5ML ~~LOC~~ SOAJ: SUBCUTANEOUS | 0 refills | Status: DC

## 2019-06-04 MED ORDER — GLIPIZIDE 5 MG PO TABS: 5.0000 mg | ORAL_TABLET | Freq: Two times a day (BID) | ORAL | 0 refills | Status: DC

## 2019-06-04 MED FILL — glipiZIDE 5 MG TABS: 5 | 30 days supply | Qty: 60 | Fill #0

## 2019-06-04 NOTE — Telephone Encounter (Signed)
Please advise if you are still willing to see this patient as you said he was lost to follow up with you, he has scheduled with you for 11/30.

## 2019-06-04 NOTE — Telephone Encounter (Signed)
MEDICATION: Dulaglutide (TRULICITY) 1.5 0000000 SOPN AND glipiZIDE (GLUCOTROL) 5 MG tablet  PHARMACY:   [place], [place] [phone] (Phone) [phone] (Fax)    IS THIS A 90 DAY SUPPLY : Yes  IS PATIENT OUT OF MEDICATION: Yes  IF NOT; HOW MUCH IS LEFT: 0  LAST APPOINTMENT DATE: @09 /18/2019  NEXT APPOINTMENT DATE:@11 /30/2020  DO WE HAVE YOUR PERMISSION TO LEAVE A DETAILED MESSAGE: Yes  OTHER COMMENTS:    **Let patient know to contact pharmacy at the end of the day to make sure medication is ready. **  ** Please notify patient to allow 48-72 hours to process**  **Encourage patient to contact the pharmacy for refills or they can request refills through [facility]**

## 2019-06-04 NOTE — Telephone Encounter (Signed)
Yes, I will see him.

## 2019-06-04 NOTE — Telephone Encounter (Signed)
30 day supply sent until his upcoming appointment.

## 2019-06-05 MED FILL — TRULICITY 1.5 MG/0.5 ML PEN: 1.5 | 28 days supply | Qty: 2 | Fill #0

## 2019-06-16 ENCOUNTER — Ambulatory Visit: Payer: 59 | Admitting: Internal Medicine

## 2019-06-16 ENCOUNTER — Encounter: Payer: Self-pay | Admitting: Internal Medicine

## 2019-06-16 ENCOUNTER — Other Ambulatory Visit: Payer: Self-pay

## 2019-06-16 VITALS — BP 110/70 | HR 105 | Ht 68.0 in | Wt 147.0 lb

## 2019-06-16 DIAGNOSIS — R634 Abnormal weight loss: Secondary | ICD-10-CM | POA: Diagnosis not present

## 2019-06-16 DIAGNOSIS — E1165 Type 2 diabetes mellitus with hyperglycemia: Secondary | ICD-10-CM

## 2019-06-16 DIAGNOSIS — E785 Hyperlipidemia, unspecified: Secondary | ICD-10-CM

## 2019-06-16 LAB — POCT GLYCOSYLATED HEMOGLOBIN (HGB A1C): Hemoglobin A1C: 10.1 % — AB (ref 4.0–5.6)

## 2019-06-16 MED ORDER — LANTUS SOLOSTAR 100 UNIT/ML ~~LOC~~ SOPN: 10.0000 [IU] | PEN_INJECTOR | Freq: Every day | SUBCUTANEOUS | 11 refills | Status: DC

## 2019-06-16 MED ORDER — INSULIN PEN NEEDLE 32G X 4 MM MISC: 3 refills | Status: DC

## 2019-06-16 MED FILL — UNIFINE PENTIPS 32GX5/32: 32G X 4 MM | 90 days supply | Qty: 100 | Fill #0

## 2019-06-16 MED FILL — UNIFINE PENTIPS 32GX5/32": 32G X 4 MM | 90 days supply | Qty: 100 | Fill #0

## 2019-06-16 MED FILL — LANTUS SOLOSTAR 100 UNITS/M: 100 | 90 days supply | Qty: 9 | Fill #0

## 2019-06-16 NOTE — Addendum Note (Signed)
Addended by: Cardell Peach I on: 06/16/2019 09:28 AM   Modules accepted: Orders

## 2019-06-16 NOTE — Patient Instructions (Addendum)
Please continue:  Metformin ER 750 2x a day with meals  Glipizide 5 mg 30 minutes before breakfast and dinner  Trulicity 1.5 mg weekly  Please add: - Lantus 10 units in am Increase by 2 units every 2 days until sugars later in the day are at goal or you reach 24 units  Please return in 3-4 months with your sugar log.

## 2019-06-16 NOTE — Progress Notes (Signed)
Patient ID: Jim [name], male   DOB: [birth year], 53 y.o.   MRN: [id number]   This visit occurred during the SARS-CoV-2 public health emergency.  Safety protocols were in place, including screening questions prior to the visit, additional usage of staff PPE, and extensive cleaning of exam room while observing appropriate contact time as indicated for disinfecting solutions.   HPI: Jim [name] is a 53 y.o.-year-old male, returning for follow-up for DM2, dx in 02/2011, non-insulin-dependent, uncontrolled, without long term complications.  Last visit 1 year and 2 months ago.  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  However, he is less active during the day, except for the days when he travels.  Reviewed HbA1c levels: Lab Results  Component Value Date   HGBA1C 6.8 (A) 04/03/2018   HGBA1C 7.2 11/28/2017   HGBA1C 7.4 08/31/2017   Pt is on a regimen of: Please continue:  Metformin ER 750 2x a day with meals  Glipizide 5 mg 30 minutes before breakfast and dinner  Trulicity 1.5 mg weekly - started 2019-tolerates this well He was on Jardiance 10 mg daily in am - started 11/2016 >> lost weight (15 lbs) and sugars were higher >> stopped 04/2017 He was on insulin right after dx.  He ruled out for type 1 diabetes: Component     Latest Ref Rng & Units 11/28/2017  C-Peptide     0.80 - 3.85 ng/mL 1.97  Glucose, Plasma     65 - 99 mg/dL 153 (H)  Glutamic Acid Decarb Ab     <5 IU/mL <5  ZNT8 Antibodies     U/mL <15  Islet Cell Ab     Neg:<1:1 Negative   Pt checks his sugars 0 to once a day: - am: 89, 134-162, 204 >> 127, 136, 169 >> 125-175 - 2h after b'fast: n/c >> 264 (today) >> n/c - before lunch: 100 >> n/c - 2h after lunch: n/c >> 180-190s >> n/c >> 307 - before dinner:  n/c >> 107-131 >> 104, 119 >> n/c - 2h after dinner: n/c - bedtime: n/c - nighttime: n/c Lowest sugar was 90s >> 89 >> 89-90 >> 100;  has hypoglycemia awareness  in the 90s. Highest sugar was 200s >> 316 when sick >> 169 >> 307.  Glucometer: TruePlus  Pt's meals are: - Breakfast: bowl cereals, 1/2 bagel + cream cheese, coffee - Lunch: sandwich/sub/burrito, half and half sweet tea - Dinner: chicken or fish + veggies + starches - Snacks: protein bar if needed in am; may have cereal bowl after dinner  No CKD, last BUN/creatinine:  Lab Results  Component Value Date   BUN 23 12/31/2017   BUN 19 04/16/2017   CREATININE 1.00 12/31/2017   CREATININE 0.76 04/16/2017  Not on an ACE inhibitor/ARB. + Dyslipidemia; last set of lipids: Lab Results  Component Value Date   CHOL 157 12/31/2017   HDL 42.00 12/31/2017   LDLCALC 86 12/31/2017   LDLDIRECT 90.0 04/30/2015   TRIG 145.0 12/31/2017   CHOLHDL 4 12/31/2017  Not on a statin. - last eye exam was on 03/2019: No DR - no  numbness and tingling in his feet.   Pt has FH of DM in older brother, nephew (DM1).  ROS: Constitutional: no weight gain/+ weight loss, no fatigue, no subjective hyperthermia, no subjective hypothermia Eyes: no blurry vision, no xerophthalmia ENT: no sore throat, no nodules palpated in neck, no dysphagia, no odynophagia, no  hoarseness Cardiovascular: no CP/no SOB/no palpitations/no leg swelling Respiratory: no cough/no SOB/no wheezing Gastrointestinal: no N/no V/no D/no C/no acid reflux Musculoskeletal: no muscle aches/no joint aches Skin: no rashes, no hair loss Neurological: no tremors/no numbness/no tingling/no dizziness  I reviewed pt's medications, allergies, PMH, social hx, family hx, and changes were documented in the history of present illness. Otherwise, unchanged from my initial visit note.  Past Medical History:  Diagnosis Date  . Diabetes mellitus 2012  . GERD (gastroesophageal reflux disease)    Past Surgical History:  Procedure Laterality Date  . CLOSED REDUCTION ANKLE FRACTURE     Social History   Socioeconomic History  . Marital status: Married     Spouse name: Not on file  . Number of children: 2  Social Needs  Occupational History  .  [employer]  Tobacco Use  . Smoking status: Never Smoker  . Smokeless tobacco: Never Used  Substance and Sexual Activity  . Alcohol use: Yes    Comment: ocassionally, less than 1 drink per week, wine beer  . Drug use: No  Social History Narrative   Works for [employer]   . Lives in [place]. 2 children, daughter 34YO and son 11YO   Current Outpatient Medications on File Prior to Visit  Medication Sig Dispense Refill  . acetaminophen (TYLENOL) 500 MG tablet Take 500 mg by mouth every 6 (six) hours as needed.    . benzonatate (TESSALON) 200 MG capsule Take 1 capsule (200 mg total) by mouth 2 (two) times daily as needed for cough. 20 capsule 0  . Dulaglutide (TRULICITY) 1.5 0000000 SOPN INJECT 1 PEN UNDER THE SKIN WEEKLY IN THE MORNING 3 pen 0  . glipiZIDE (GLUCOTROL) 5 MG tablet Take 1 tablet (5 mg total) by mouth 2 (two) times daily before a meal. 60 tablet 0  . Glucose Blood (BLOOD GLUCOSE TEST STRIPS) STRP True Result test strips, to test blood glucose 3 times daily, use as directed 100 each 5  . metFORMIN (GLUCOPHAGE-XR) 750 MG 24 hr tablet Take 1 tablet (750 mg total) by mouth 2 (two) times daily. Need appointment for refills 180 tablet 3  . predniSONE (DELTASONE) 50 MG tablet Take 1 tablet (50 mg total) by mouth daily. 5 tablet 0  . sildenafil (VIAGRA) 100 MG tablet Take 1 tablet (100 mg total) by mouth daily as needed for erectile dysfunction. 3 tablet 11  . TRUEPLUS LANCETS 30G MISC USE AS DIRECTED 100 each 11   No current facility-administered medications on file prior to visit.    Allergies  Allergen Reactions  . Ciprofloxacin Rash   Family History  Problem Relation Age of Onset  . Stroke Paternal Grandfather   . Diabetes Brother   . Parkinsonism Maternal Grandfather     PE: BP 110/70   Pulse (!) 105   Ht 5\' 8"  (1.727 m)   Wt 147 lb (66.7 kg)   SpO2 96%    BMI 22.35 kg/m  Wt Readings from Last 3 Encounters:  06/16/19 147 lb (66.7 kg)  11/28/18 153 lb (69.4 kg)  09/11/18 152 lb (68.9 kg)   Constitutional: normal weight, in NAD Eyes: PERRLA, EOMI, no exophthalmos ENT: moist mucous membranes, no thyromegaly, no cervical lymphadenopathy Cardiovascular: tachycardia, RR, No MRG Respiratory: CTA B Gastrointestinal: abdomen soft, NT, ND, BS+ Musculoskeletal: no deformities, strength intact in all 4 Skin: moist, warm, no rashes Neurological: no tremor with outstretched hands, DTR normal in all 4  ASSESSMENT: 1. DM2, non-insulin-dependent, uncontrolled, without  long-term complications, but with hyperglycemia  2.  Dyslipidemia  3.  Weight loss  PLAN:  1. Patient with longstanding, previously uncontrolled type 2 diabetes, on oral antidiabetic regimen and weekly GLP-1 receptor agonist.  He ruled out for type 1 diabetes last year.  At last visit, sugars were at goal later in the day but occasionally higher in the morning as he was having late dinners.  However, he was planning to change his job and his dinners per year.  We did not change the regimen then.  However, he was lost to follow-up afterwards and now returns after 1 year and 2 months absence -He tells me that he did not check sugars frequently since last visit.  In the morning, they are at goal or higher, but later in the day he only checked once recently and this was in the 300s.  At this visit, we checked his HbA1c: 10.1% [facility] HIGHER) -We discussed about the absolute need to check his sugars consistently, both in the morning and also later in the day  -I also advised him about starting a consistent exercise routine. -I suggested either increasing Trulicity (however, we do not need any more weight loss for him) or adding a low-dose long-acting insulin in the morning.  We decided to go ahead with insulin but if sugars are not better control, we may need to switch to either mealtime insulin or  increase Trulicity. - I suggested to:  Patient Instructions  Please continue:  Metformin ER 750 2x a day with meals  Glipizide 5 mg 30 minutes before breakfast and dinner  Trulicity 1.5 mg weekly  Please add: - Lantus 10 units in am Increase by 2 units every 2 days until sugars later in the day are at goal or you reach 24 units  Please return in 3-4 months with your sugar log. -   - advised to check sugars at different times of the day - 1-2x a day, rotating check times - advised for yearly eye exams >> he is UTD - return to clinic in 3-4 months     2.  Dyslipidemia -Reviewed latest lipid panel from 12/2017: All fractions at goal Lab Results  Component Value Date   CHOL 157 12/31/2017   HDL 42.00 12/31/2017   LDLCALC 86 12/31/2017   LDLDIRECT 90.0 04/30/2015   TRIG 145.0 12/31/2017   CHOLHDL 4 12/31/2017  -He is not on a statin. -At last visit, we discussed about improving his diet by reducing fatty foods: Meat, cheese, eggs -He is due for lipid panel >> appt with PCP pending  3.  Weight loss -He initially lost a little too much weight on Trulicity, but weight stabilized before last visit. - since last visit, he lost 6 more lbs >> will add insulin which is weight-inducing  Philemon Kingdom, MD PhD [facility]

## 2019-06-19 MED FILL — METFORMIN HCL ER 750 MG TB2: 750 | 30 days supply | Qty: 60 | Fill #2

## 2019-07-08 ENCOUNTER — Other Ambulatory Visit: Payer: Self-pay | Admitting: Internal Medicine

## 2019-07-08 MED FILL — TRULICITY 1.5 MG/0.5 ML PEN: 1.5 | 28 days supply | Qty: 2 | Fill #0

## 2019-07-08 MED FILL — glipiZIDE 5 MG TABS: 5 | 30 days supply | Qty: 60 | Fill #0

## 2019-07-21 MED FILL — METFORMIN HCL ER 750 MG TB2: 750 | 30 days supply | Qty: 60 | Fill #3

## 2019-08-08 MED FILL — glipiZIDE 5 MG TABS: 5 | 30 days supply | Qty: 60 | Fill #1

## 2019-08-10 MED FILL — TRULICITY 1.5 MG/0.5 ML PEN: 1.5 | 28 days supply | Qty: 2 | Fill #1

## 2019-08-22 MED FILL — METFORMIN HCL ER 750 MG TB2: 750 | 30 days supply | Qty: 60 | Fill #4

## 2019-09-12 MED FILL — TRULICITY 1.5 MG/0.5 ML PEN: 1.5 | 28 days supply | Qty: 2 | Fill #2

## 2019-09-16 ENCOUNTER — Ambulatory Visit: Payer: 59 | Admitting: Internal Medicine

## 2019-09-16 ENCOUNTER — Encounter: Payer: Self-pay | Admitting: Internal Medicine

## 2019-09-16 ENCOUNTER — Other Ambulatory Visit: Payer: Self-pay

## 2019-09-16 ENCOUNTER — Other Ambulatory Visit: Payer: Self-pay | Admitting: Internal Medicine

## 2019-09-16 VITALS — BP 142/80 | HR 88 | Ht 68.0 in | Wt 150.0 lb

## 2019-09-16 DIAGNOSIS — E785 Hyperlipidemia, unspecified: Secondary | ICD-10-CM | POA: Diagnosis not present

## 2019-09-16 DIAGNOSIS — E1165 Type 2 diabetes mellitus with hyperglycemia: Secondary | ICD-10-CM | POA: Diagnosis not present

## 2019-09-16 LAB — POCT GLYCOSYLATED HEMOGLOBIN (HGB A1C): Hemoglobin A1C: 7.4 % — AB (ref 4.0–5.6)

## 2019-09-16 MED ORDER — GLIPIZIDE 5 MG PO TABS: ORAL_TABLET | ORAL | 3 refills | Status: DC

## 2019-09-16 MED ORDER — TRULICITY 1.5 MG/0.5ML ~~LOC~~ SOAJ: SUBCUTANEOUS | 3 refills | Status: DC

## 2019-09-16 MED FILL — glipiZIDE 5 MG TABS: 5 | 90 days supply | Qty: 180 | Fill #0

## 2019-09-16 NOTE — Progress Notes (Signed)
Patient ID: Jim [name], male   DOB: May 06, 1966, 54 y.o.   MRN: [id number]   This visit occurred during the SARS-CoV-2 public health emergency.  Safety protocols were in place, including screening questions prior to the visit, additional usage of staff PPE, and extensive cleaning of exam room while observing appropriate contact time as indicated for disinfecting solutions.   HPI: Jim [name] is a 54 y.o.-year-old male, returning for follow-up for DM2, dx in 02/2011, insulin-dependent since 05/2019, uncontrolled, without long term complications.  Last visit 4 months ago.  Before last visit, he changed his job and starting to work 8 AM to 5 PM, so he moved his dinners earlier.  At last visit, we added a low-dose insulin and his sugars improved afterwards.  Reviewed HbA1c: Lab Results  Component Value Date   HGBA1C 10.1 (A) 06/16/2019   HGBA1C 6.8 (A) 04/03/2018   HGBA1C 7.2 11/28/2017   Pt is on a regimen of:  Metformin ER 750 2x a day with meals  Glipizide 5 mg 30 minutes before breakfast and dinner  Trulicity 1.5 mg weekly - started 2019-tolerates this well  Lantus 10 units daily-added 05/2019 >> ran out 1 week ago - abd. Pain 1-2h later He was on Jardiance 10 mg daily in am - started 11/2016 >> lost weight (15 lbs) and sugars were higher >> stopped 04/2017 He was on insulin right after dx. but then was able to come off.  Pt checks his sugars 0 to once a day: - am: 89, 134-162, 204 >> 127, 136, 169 >> 125-175 >> 104-114 - 2h after b'fast: n/c >> 264 (today) >> n/c - before lunch: 100 >> n/c - 2h after lunch: n/c >> 180-190s >> n/c >> 307 >> n/c - before dinner:  n/c >> 107-131 >> 104, 119 >> n/c - 2h after dinner: n/c - bedtime: n/c - nighttime: n/c Lowest sugar was 89-90 >> 100 >> 96;  has hypoglycemia awareness in the 90s. Highest sugar was 307 >> 197.  Glucometer: TruePlus  Pt's meals are: - Breakfast: bowl cereals, 1/2 bagel + cream cheese, coffee - Lunch:  sandwich/sub/burrito, half and half sweet tea - Dinner: chicken or fish + veggies + starches - Snacks: protein bar if needed in am; may have cereal bowl after dinner  No CKD, last BUN/creatinine:  Lab Results  Component Value Date   BUN 23 12/31/2017   BUN 19 04/16/2017   CREATININE 1.00 12/31/2017   CREATININE 0.76 04/16/2017  Not on an ACE inhibitor/ARB. + Dyslipidemia; last set of lipids: Lab Results  Component Value Date   CHOL 157 12/31/2017   HDL 42.00 12/31/2017   LDLCALC 86 12/31/2017   LDLDIRECT 90.0 04/30/2015   TRIG 145.0 12/31/2017   CHOLHDL 4 12/31/2017  Not on a statin. - last eye exam was in 03/2019: No DR - no  numbness and tingling in his feet.   Pt has FH of DM in older brother, nephew (DM1).  ROS: Constitutional: no weight gain/no weight loss, no fatigue, no subjective hyperthermia, no subjective hypothermia Eyes: no blurry vision, no xerophthalmia ENT: no sore throat, no nodules palpated in neck, no dysphagia, no odynophagia, no hoarseness Cardiovascular: no CP/no SOB/no palpitations/no leg swelling Respiratory: no cough/no SOB/no wheezing Gastrointestinal: no N/no V/no D/no C/no acid reflux Musculoskeletal: no muscle aches/no joint aches Skin: no rashes, no hair loss Neurological: no tremors/no numbness/no tingling/no dizziness  I reviewed pt's medications, allergies, PMH, social hx, family hx, and changes were documented in  the history of present illness. Otherwise, unchanged from my initial visit note.  Past Medical History:  Diagnosis Date  . Diabetes mellitus 2012  . GERD (gastroesophageal reflux disease)    Past Surgical History:  Procedure Laterality Date  . CLOSED REDUCTION ANKLE FRACTURE     Social History   Socioeconomic History  . Marital status: Married    Spouse name: Not on file  . Number of children: 2  Social Needs  Occupational History  .  [employer]  Tobacco Use  . Smoking status: Never Smoker  . Smokeless  tobacco: Never Used  Substance and Sexual Activity  . Alcohol use: Yes    Comment: ocassionally, less than 1 drink per week, wine beer  . Drug use: No  Social History Narrative   Works for [employer]   . Lives in [place]. 2 children, daughter 58YO and son 11YO   Current Outpatient Medications on File Prior to Visit  Medication Sig Dispense Refill  . acetaminophen (TYLENOL) 500 MG tablet Take 500 mg by mouth every 6 (six) hours as needed.    . Dulaglutide (TRULICITY) 1.5 0000000 SOPN INJECT 1 PEN UNDER THE SKIN WEEKLY IN THE MORNING 4 pen 2  . glipiZIDE (GLUCOTROL) 5 MG tablet TAKE 1 TABLET BY MOUTH TWICE DAILY BEFORE A MEAL 60 tablet 2  . Glucose Blood (BLOOD GLUCOSE TEST STRIPS) STRP True Result test strips, to test blood glucose 3 times daily, use as directed 100 each 5  . Insulin Glargine (LANTUS SOLOSTAR) 100 UNIT/ML Solostar Pen Inject 10 Units into the skin daily. 5 pen 11  . Insulin Pen Needle 32G X 4 MM MISC Use 1x a day 100 each 3  . metFORMIN (GLUCOPHAGE-XR) 750 MG 24 hr tablet Take 1 tablet (750 mg total) by mouth 2 (two) times daily. Need appointment for refills 180 tablet 3  . sildenafil (VIAGRA) 100 MG tablet Take 1 tablet (100 mg total) by mouth daily as needed for erectile dysfunction. 3 tablet 11  . TRUEPLUS LANCETS 30G MISC USE AS DIRECTED 100 each 11   No current facility-administered medications on file prior to visit.   Allergies  Allergen Reactions  . Ciprofloxacin Rash   Family History  Problem Relation Age of Onset  . Stroke Paternal Grandfather   . Diabetes Brother   . Parkinsonism Maternal Grandfather     PE: BP (!) 142/80   Pulse 88   Ht 5\' 8"  (1.727 m)   Wt 150 lb (68 kg)   SpO2 98%   BMI 22.81 kg/m  Wt Readings from Last 3 Encounters:  09/16/19 150 lb (68 kg)  06/16/19 147 lb (66.7 kg)  11/28/18 153 lb (69.4 kg)   Constitutional: normal weight, in NAD Eyes: PERRLA, EOMI, no exophthalmos ENT: moist mucous membranes, no  thyromegaly, no cervical lymphadenopathy Cardiovascular: RRR, No MRG Respiratory: CTA B Gastrointestinal: abdomen soft, NT, ND, BS+ Musculoskeletal: no deformities, strength intact in all 4 Skin: moist, warm, no rashes Neurological: no tremor with outstretched hands, DTR normal in all 4  ASSESSMENT: 1. DM2, insulin-dependent, uncontrolled, without long-term complications, but with hyperglycemia  He ruled out type 1 diabetes: Component     Latest Ref Rng & Units 11/28/2017  C-Peptide     0.80 - 3.85 ng/mL 1.97  Glucose, Plasma     65 - 99 mg/dL 153 (H)  Glutamic Acid Decarb Ab     <5 IU/mL <5  ZNT8 Antibodies     U/mL <15  Islet  Cell Ab     Neg:<1:1 Negative   2.  Dyslipidemia  PLAN:  1. Patient with longstanding, uncontrolled type 2 diabetes, on diabetic regimen a GLP-1 receptor agonist.  He ruled out for type 1 diabetes in 2019. -At last visit, HbA1c was much higher than before, at 10.1%.  He was not checking sugars consistently, but whenever he checked, they were at goal or slightly higher in the morning but going in the 300s later in the day.  We discussed about the absolute need to check sugars consistently at different times of the day.  I also advised him about improving his meals and starting a consistent exercise routine.  At that time he had some weight loss and I was reticent to increase his Trulicity dose.  We did add long-acting insulin then at 10 units and advised him to increase the dose as needed-given target CBG parameters. -At this visit, he does not check his sugars frequently, but whenever he checks, they are much improved from before.  However, he finished up his insulin approximately a week ago and did not refill it because he noticed abdominal pain that he started to develop approximately 1 to 2 hours after injecting the Lantus.  He started to change his diet and eliminating sweet drinks and would like to continue with dietary changes to see if he can avoid insulin.   For now, we can try to do so but with frequent blood checks and I advised him to let me know if his sugars increase above target, in which case, we may need to add back insulin.  We should probably try another insulin, if covered, since Lantus caused abdominal pain. - I suggested to:  Patient Instructions  Please continue to follow - Metformin ER 750 2x a day with meals - Glipizide 5 mg 30 minutes before breakfast and dinner - Trulicity 1.5 mg weekly  If the sugars are: - >130 in am  - >180 after meals Let me know >> we may need to restart insulin.  Please return in 3-4 months with your sugar log.   - we checked his HbA1c: 7.4% (better!) - advised to check sugars at different times of the day - 1-2x a day, rotating check times - advised for yearly eye exams >> he is UTD - We will check his annual labs today - return to clinic in 3-4 months     2.  Dyslipidemia -Reviewed latest lipid panel from 12/2017: Fractions at goal Lab Results  Component Value Date   CHOL 157 12/31/2017   HDL 42.00 12/31/2017   LDLCALC 86 12/31/2017   LDLDIRECT 90.0 04/30/2015   TRIG 145.0 12/31/2017   CHOLHDL 4 12/31/2017  -He is not on a statin. -At last visit, we discussed about his diet by reducing fatty foods -We will repeat his lipid panel today  Component     Latest Ref Rng & Units 09/16/2019  Sodium     135 - 145 mEq/L 136  Potassium     3.5 - 5.1 mEq/L 4.4  Chloride     96 - 112 mEq/L 101  CO2     19 - 32 mEq/L 27  Glucose     70 - 99 mg/dL 127 (H)  BUN     6 - 23 mg/dL 21  Creatinine     0.40 - 1.50 mg/dL 1.08  Total Bilirubin     0.2 - 1.2 mg/dL 0.4  Alkaline Phosphatase     39 - 117  U/L 71  AST     0 - 37 U/L 20  ALT     0 - 53 U/L 24  Total Protein     6.0 - 8.3 g/dL 6.8  Albumin     3.5 - 5.2 g/dL 4.2  Calcium     8.4 - 10.5 mg/dL 9.5  GFR     >60.00 mL/min 71.86  Cholesterol     0 - 200 mg/dL 140  Triglycerides     0.0 - 149.0 mg/dL 123.0  HDL Cholesterol      >39.00 mg/dL 40.50  VLDL     0.0 - 40.0 mg/dL 24.6  LDL (calc)     0 - 99 mg/dL 75  Total CHOL/HDL Ratio      3  NonHDL      99.66  Microalb, Ur     0.0 - 1.9 mg/dL <0.7  Creatinine,U     mg/dL 102.4  MICROALB/CREAT RATIO     0.0 - 30.0 mg/g 0.7   Labs at goal.  Philemon Kingdom, MD PhD [facility] Endocrinology

## 2019-09-16 NOTE — Patient Instructions (Signed)
Please continue to follow - Metformin ER 750 2x a day with meals - Glipizide 5 mg 30 minutes before breakfast and dinner - Trulicity 1.5 mg weekly  If the sugars are: - >130 in am  - >180 after meals Let me know >> we may need to restart insulin.  Please return in 3-4 months with your sugar log.

## 2019-09-17 LAB — COMPREHENSIVE METABOLIC PANEL
ALT: 24 U/L (ref 0–53)
AST: 20 U/L (ref 0–37)
Albumin: 4.2 g/dL (ref 3.5–5.2)
Alkaline Phosphatase: 71 U/L (ref 39–117)
BUN: 21 mg/dL (ref 6–23)
CO2: 27 mEq/L (ref 19–32)
Calcium: 9.5 mg/dL (ref 8.4–10.5)
Chloride: 101 mEq/L (ref 96–112)
Creatinine, Ser: 1.08 mg/dL (ref 0.40–1.50)
GFR: 71.86 mL/min (ref >60.00)
Glucose, Bld: 127 mg/dL — ABNORMAL HIGH (ref 70–99)
Potassium: 4.4 mEq/L (ref 3.5–5.1)
Sodium: 136 mEq/L (ref 135–145)
Total Bilirubin: 0.4 mg/dL (ref 0.2–1.2)
Total Protein: 6.8 g/dL (ref 6.0–8.3)

## 2019-09-17 LAB — LIPID PANEL
Cholesterol: 140 mg/dL (ref 0–200)
HDL: 40.5 mg/dL (ref >39.00)
LDL Cholesterol: 75 mg/dL (ref 0–99)
NonHDL: 99.66
Total CHOL/HDL Ratio: 3
Triglycerides: 123 mg/dL (ref 0.0–149.0)
VLDL: 24.6 mg/dL (ref 0.0–40.0)

## 2019-09-17 LAB — MICROALBUMIN / CREATININE URINE RATIO
Creatinine,U: 102.4 mg/dL
Microalb Creat Ratio: 0.7 mg/g (ref 0.0–30.0)
Microalb, Ur: <0.7 mg/dL (ref 0.0–1.9)

## 2019-10-03 MED FILL — METFORMIN HCL ER 750 MG TB2: 750 | 30 days supply | Qty: 60 | Fill #5

## 2019-10-17 MED FILL — TRULICITY 1.5 MG/0.5 ML PEN: 1.5 | 28 days supply | Qty: 2 | Fill #0

## 2019-10-20 ENCOUNTER — Other Ambulatory Visit: Payer: Self-pay

## 2019-10-20 MED ORDER — TRULICITY 1.5 MG/0.5ML ~~LOC~~ SOAJ: SUBCUTANEOUS | 3 refills | Status: DC

## 2019-11-13 MED FILL — METFORMIN HCL ER 750 MG TB2: 750 | 30 days supply | Qty: 60 | Fill #6

## 2019-11-13 MED FILL — TRULICITY 1.5 MG/0.5 ML PEN: 1.5 | 28 days supply | Qty: 2 | Fill #1

## 2019-12-20 MED FILL — METFORMIN HCL ER 750 MG TB2: 750 | 30 days supply | Qty: 60 | Fill #7

## 2020-01-12 MED FILL — glipiZIDE 5 MG TABS: 5 | 90 days supply | Qty: 180 | Fill #1

## 2020-01-13 ENCOUNTER — Ambulatory Visit: Payer: 59 | Admitting: Internal Medicine

## 2020-01-30 MED FILL — METFORMIN HCL ER 750 MG TB2: 750 | 30 days supply | Qty: 60 | Fill #8

## 2020-01-30 MED FILL — TRULICITY 1.5 MG/0.5 ML PEN: 1.5 | 28 days supply | Qty: 2 | Fill #2

## 2020-02-12 ENCOUNTER — Telehealth: Payer: Self-pay | Admitting: Family Medicine

## 2020-02-12 NOTE — Telephone Encounter (Signed)
Error

## 2020-02-13 ENCOUNTER — Other Ambulatory Visit: Payer: Self-pay

## 2020-02-13 ENCOUNTER — Ambulatory Visit: Payer: 59 | Admitting: Family Medicine

## 2020-02-13 ENCOUNTER — Ambulatory Visit: Payer: Self-pay

## 2020-02-13 ENCOUNTER — Encounter: Payer: Self-pay | Admitting: Family Medicine

## 2020-02-13 VITALS — BP 108/80 | HR 87 | Ht 68.0 in | Wt 147.0 lb

## 2020-02-13 DIAGNOSIS — G8929 Other chronic pain: Secondary | ICD-10-CM

## 2020-02-13 DIAGNOSIS — S2231XA Fracture of one rib, right side, initial encounter for closed fracture: Secondary | ICD-10-CM

## 2020-02-13 DIAGNOSIS — M25511 Pain in right shoulder: Secondary | ICD-10-CM | POA: Diagnosis not present

## 2020-02-13 MED ORDER — TRAMADOL HCL 50 MG PO TABS: 50.0000 mg | ORAL_TABLET | Freq: Two times a day (BID) | ORAL | 0 refills | Status: AC | PRN

## 2020-02-13 MED ORDER — METHYLPREDNISOLONE ACETATE 80 MG/ML IJ SUSP
80.0000 mg | Freq: Once | INTRAMUSCULAR | Status: AC
  Administered 2020-02-13: 80 mg via INTRAMUSCULAR

## 2020-02-13 MED ORDER — MELOXICAM 7.5 MG PO TABS: 7.5000 mg | ORAL_TABLET | Freq: Every day | ORAL | 0 refills | Status: DC

## 2020-02-13 MED ORDER — KETOROLAC TROMETHAMINE 60 MG/2ML IM SOLN
60.0000 mg | Freq: Once | INTRAMUSCULAR | Status: AC
  Administered 2020-02-13: 60 mg via INTRAMUSCULAR

## 2020-02-13 MED FILL — traMADol HCL 50 MG TABS: 50 | 5 days supply | Qty: 10 | Fill #0

## 2020-02-13 MED FILL — MELOXICAM 7.5 MG TABLET: 7.5 | 30 days supply | Qty: 30 | Fill #0

## 2020-02-13 NOTE — Assessment & Plan Note (Signed)
Patient on the right side does have some bruising of the superficial skin and does on ultrasound have what appears to be a mild cortical irregularity of the seventh rib on the right side.  I do not see any type of displacement.  Discussed x-rays which patient declined at the moment.  Patient has contusion of the shoulder with what appears to be a reactive bursitis.  Rotator cuff appears to be intact.  Discussed with patient about icing regimen, home exercises, patient given a shot of Toradol and Depo-Medrol warned of potential side effects.  Patient given meloxicam for the next 10 days and tramadol for breakthrough pain.  Encourage patient to be very careful with this medication.  Follow-up again in 2 weeks otherwise
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
## 2020-02-13 NOTE — Patient Instructions (Addendum)
Good to see you Start meloxicam for 10 days starting tomorrow tramadol at night  Xrays on the way out See me again in 2-3 weeks

## 2020-03-04 MED FILL — TRULICITY 1.5 MG/0.5 ML PEN: 1.5 | 28 days supply | Qty: 2 | Fill #3

## 2020-03-04 MED FILL — METFORMIN HCL ER 750 MG TB2: 750 | 30 days supply | Qty: 60 | Fill #9

## 2020-03-09 ENCOUNTER — Encounter: Payer: Self-pay | Admitting: Family Medicine

## 2020-03-09 ENCOUNTER — Other Ambulatory Visit: Payer: Self-pay

## 2020-03-09 ENCOUNTER — Ambulatory Visit: Payer: 59 | Admitting: Family Medicine

## 2020-03-09 ENCOUNTER — Ambulatory Visit: Payer: Self-pay

## 2020-03-09 VITALS — BP 104/72 | HR 110 | Ht 68.0 in | Wt 144.0 lb

## 2020-03-09 DIAGNOSIS — F329 Major depressive disorder, single episode, unspecified: Secondary | ICD-10-CM

## 2020-03-09 DIAGNOSIS — M25511 Pain in right shoulder: Secondary | ICD-10-CM

## 2020-03-09 DIAGNOSIS — F4323 Adjustment disorder with mixed anxiety and depressed mood: Secondary | ICD-10-CM | POA: Diagnosis not present

## 2020-03-09 DIAGNOSIS — F32A Depression, unspecified: Secondary | ICD-10-CM

## 2020-03-09 DIAGNOSIS — M25519 Pain in unspecified shoulder: Secondary | ICD-10-CM | POA: Insufficient documentation

## 2020-03-09 DIAGNOSIS — G8929 Other chronic pain: Secondary | ICD-10-CM
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
## 2020-03-09 NOTE — Patient Instructions (Signed)
Arnica lotion on top of shoulder Hope manipulation will help Ribs will take another 2 weeks to heel Referral to Behavioral Health-0They will call you See me in 5-6 weeks

## 2020-03-09 NOTE — Assessment & Plan Note (Signed)
Ultrasound does show some pain in the [facility] joint with some mild swelling.  Patient does have the meloxicam for breakthrough, discussed Arnica lotion, discussed icing regimen.  Discussed avoiding certain activities.  Follow-up with me again in 4 to 8 weeks hopefully will be able to start manipulation.

## 2020-03-30 ENCOUNTER — Ambulatory Visit: Payer: 59 | Admitting: Psychology

## 2020-03-31 ENCOUNTER — Telehealth: Payer: Self-pay | Admitting: Family Medicine

## 2020-03-31 ENCOUNTER — Telehealth (INDEPENDENT_AMBULATORY_CARE_PROVIDER_SITE_OTHER): Payer: 59 | Admitting: Family

## 2020-03-31 ENCOUNTER — Telehealth: Payer: Self-pay | Admitting: Family

## 2020-03-31 VITALS — Ht 68.0 in

## 2020-03-31 DIAGNOSIS — L259 Unspecified contact dermatitis, unspecified cause: Secondary | ICD-10-CM | POA: Insufficient documentation

## 2020-03-31 DIAGNOSIS — E1165 Type 2 diabetes mellitus with hyperglycemia: Secondary | ICD-10-CM

## 2020-03-31 DIAGNOSIS — L255 Unspecified contact dermatitis due to plants, except food: Secondary | ICD-10-CM

## 2020-03-31 MED ORDER — PREDNISONE 10 MG PO TABS: ORAL_TABLET | ORAL | 0 refills | Status: DC

## 2020-03-31 MED FILL — predniSONE 10 MG TABS: 10 | 4 days supply | Qty: 10 | Fill #0

## 2020-03-31 NOTE — Telephone Encounter (Signed)
Pt called requesting refill of Prednisone as he is travelling and has some Poison Ivy. Advised pt to call his PCP as this is a non Sports Med related issue.

## 2020-03-31 NOTE — Telephone Encounter (Signed)
Left message for patient that Dr. Tamala Julian is out of the office and for him to call PCP for script.

## 2020-03-31 NOTE — Assessment & Plan Note (Signed)
Uncontrolled. Advised to make follow up with me if he would like me to resume DM management.

## 2020-03-31 NOTE — Telephone Encounter (Signed)
LMTCB to triage & see what all patient has tried besides prednisone. I wanted to what rash was like & how long he had it.

## 2020-03-31 NOTE — Progress Notes (Signed)
Virtual Visit via Video Note  I connected with@  on 03/31/20 at  2:00 PM EDT by a video enabled telemedicine application and verified that I am speaking with the correct person using two identifiers.  Location patient: home Location provider:work  Persons participating in the virtual visit: patient, provider  I discussed the limitations of evaluation and management by telemedicine and the availability of in person appointments. The patient expressed understanding and agreed to proceed.   HPI: Blister rash left forearm x 3 days, worsening.5 days ago worked in the yard, rash started 3 days ago.  Initially rash presented to left foream . Now spread to right forearm and even chest.Slight swelling near left eye this morning , since improved, however this prompted his call today. No trouble swallowing, lip swallowing, fever, sob, cough, vision changes.   Very pruritic.  Rash looks like prior rashes of poison ivy. Had poison ivy in which had prednisone 'taper' in the past.   Tried calamine with temporary relief.  No new medications.  In [place] for work today. Flies home late tomorrow night.    Took one 50mg  dose of prednisone this morning given Dr Tamala Julian and has one more tablet of 50mg  with him in [place].     DM II- last a1c 7.4. compliant metformin 750mg  BID, glipizide 5mg  bid, trulicity 1.5mg .  FBG average 130- 170.  Desires for me to follow his DM.   ROS: See pertinent positives and negatives per HPI.    EXAM:  VITALS per patient if applicable:  GENERAL: alert, oriented, appears well and in no acute distress  HEENT: atraumatic, conjunttiva clear, no obvious abnormalities on inspection of external nose and ears  NECK: normal movements of the head and neck  LUNGS: on inspection no signs of respiratory distress, breathing rate appears normal, no obvious gross SOB, gasping or wheezing  CV: no obvious cyanosis  MS: moves all visible extremities without noticeable  abnormality  PSYCH/NEURO: pleasant and cooperative, no obvious depression or anxiety, speech and thought processing grossly intact  ASSESSMENT AND PLAN:  Discussed the following assessment and plan:  Problem List Items Addressed This Visit      Endocrine   Type 2 diabetes mellitus with hyperglycemia, without long-term current use of insulin (HCC)    Uncontrolled. Advised to make follow up with me if he would like me to resume DM management.         Musculoskeletal and Integument   Contact dermatitis - Primary    Afrebrile. Well appearing on video. Unable to see rash on video. Discussed limitations of assessment of rash over video. Patient feels rash is similar to prior poison ivy in the past. Would prefer for him to start 40mg  prednisone taper however being that he is in [facility], I agreed that reasonable for him to take prednisone 50mg  tomorrow ( he took one dose today) which he has and he may start prednisone taper on Friday 04/02/20. He verbalized understanding and will present to UC if rash starts on eye, eyelid or he has worsening of symptoms.       Relevant Medications   predniSONE (DELTASONE) 10 MG tablet      -we discussed possible serious and likely etiologies, options for evaluation and workup, limitations of telemedicine visit vs in person visit, treatment, treatment risks and precautions. Pt prefers to treat via telemedicine empirically rather then risking or undertaking an in person visit at this moment.  .   I discussed the assessment and treatment plan with  the patient. The patient was provided an opportunity to ask questions and all were answered. The patient agreed with the plan and demonstrated an understanding of the instructions.   The patient was advised to call back or seek an in-person evaluation if the symptoms worsen or if the condition fails to improve as anticipated.   Mable Paris, FNP

## 2020-03-31 NOTE — Telephone Encounter (Signed)
Patient scheduled for VV

## 2020-03-31 NOTE — Patient Instructions (Addendum)
Start prednisone taper when back from [place] and do not take on the same day that you take 50mg  prednisone which you have from Dr Tamala Julian.   Let me know how you are doing.

## 2020-03-31 NOTE — Telephone Encounter (Signed)
Call pt Add  Him to my schedule and tell him I will call him prior to my 4pm appt

## 2020-03-31 NOTE — Telephone Encounter (Signed)
Patient called in about a rash poison ivy he is taking prednisone  that he had for a back issue, patient is out of town wanted to know what he should do.

## 2020-03-31 NOTE — Telephone Encounter (Signed)
Patient is in [facility] & apparently when doing yard work Saturday came into contact with poison ivey. He now has developed a rash in multiple places. It is now above his left eye, left arm is really bad & is also on right. All over chest as well. He had a prescription for prednisone 50mg  from Dr. Tamala Julian that he never had to use. He has taken the first of 5 tablets he has. He wanted to know that was okay or anything else he can do? He got something like calamine called Iverest that helps with itching. 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 He does come into town tomorrow night. Please advise?

## 2020-03-31 NOTE — Assessment & Plan Note (Signed)
Afrebrile. Well appearing on video. Unable to see rash on video. Discussed limitations of assessment of rash over video. Patient feels rash is similar to prior poison ivy in the past. Would prefer for him to start 40mg  prednisone taper however being that he is in [facility], I agreed that reasonable for him to take prednisone 50mg  tomorrow ( he took one dose today) which he has and he may start prednisone taper on Friday 04/02/20. He verbalized understanding and will present to UC if rash starts on eye, eyelid or he has worsening of symptoms.

## 2020-04-08 ENCOUNTER — Other Ambulatory Visit: Payer: Self-pay | Admitting: Internal Medicine

## 2020-04-08 MED FILL — METFORMIN HCL ER 750 MG TB2: 750 | 30 days supply | Qty: 60 | Fill #0

## 2020-04-08 MED FILL — TRULICITY 1.5 MG/0.5 ML PEN: 1.5 | 28 days supply | Qty: 2 | Fill #4

## 2020-04-16 ENCOUNTER — Ambulatory Visit: Payer: 59 | Admitting: Family Medicine

## 2020-05-04 MED FILL — glipiZIDE 5 MG TABS: 5 | 90 days supply | Qty: 180 | Fill #2

## 2020-05-14 ENCOUNTER — Ambulatory Visit (INDEPENDENT_AMBULATORY_CARE_PROVIDER_SITE_OTHER): Payer: 59

## 2020-05-14 ENCOUNTER — Ambulatory Visit: Payer: 59 | Admitting: Family Medicine

## 2020-05-14 ENCOUNTER — Other Ambulatory Visit: Payer: Self-pay | Admitting: Family Medicine

## 2020-05-14 ENCOUNTER — Encounter: Payer: Self-pay | Admitting: Family Medicine

## 2020-05-14 ENCOUNTER — Other Ambulatory Visit: Payer: Self-pay

## 2020-05-14 ENCOUNTER — Ambulatory Visit: Payer: Self-pay

## 2020-05-14 VITALS — BP 122/82 | HR 93 | Ht 68.0 in | Wt 147.0 lb

## 2020-05-14 DIAGNOSIS — G8929 Other chronic pain: Secondary | ICD-10-CM | POA: Diagnosis not present

## 2020-05-14 DIAGNOSIS — M25511 Pain in right shoulder: Secondary | ICD-10-CM

## 2020-05-14 DIAGNOSIS — M999 Biomechanical lesion, unspecified: Secondary | ICD-10-CM | POA: Diagnosis not present

## 2020-05-14 DIAGNOSIS — M5416 Radiculopathy, lumbar region: Secondary | ICD-10-CM

## 2020-05-14 MED ORDER — IBUPROFEN 600 MG PO TABS
600.0000 mg | ORAL_TABLET | Freq: Two times a day (BID) | ORAL | 0 refills | Status: DC
Start: 2020-05-14 — End: 2020-05-14

## 2020-05-14 MED FILL — TRULICITY 1.5 MG/0.5 ML PEN: 1.5 | 84 days supply | Qty: 6 | Fill #5

## 2020-05-14 MED FILL — IBUPROFEN 600 MG TABLET: 600 | 30 days supply | Qty: 60 | Fill #0

## 2020-05-14 MED FILL — METFORMIN HCL ER 750 MG TB2: 750 | 30 days supply | Qty: 60 | Fill #1

## 2020-05-14 NOTE — Assessment & Plan Note (Signed)
Patient initially was making improvement but has plateaued.  Feels like he is having some increasing weakness.  Patient given injection today to see how much of the inflammation in the acromioclavicular joint is playing a role.  Patient also is having now signs and symptoms consistent with more of a labral pathology.  We have given a glenohumeral injection to see if that would be beneficial as well.  Patient will continue meloxicam to take but did not feel it was helpful and transition to the ibuprofen if no significant improvement I do feel MRI will be necessary.

## 2020-05-14 NOTE — Patient Instructions (Addendum)
Xray today Injected both AC and shoulder joints IBU at your pharmacy Do not use NSAIDS while using IBU It is ok to use Tylenol for additional pain relief See me again in 4-6 weeks

## 2020-05-14 NOTE — Assessment & Plan Note (Signed)
Likely stable at this time.  Responding well to manipulation.  Discussed posture and ergonomics.  Increase again in 4 to 8 weeks
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
## 2020-05-18 ENCOUNTER — Other Ambulatory Visit: Payer: Self-pay

## 2020-05-18 ENCOUNTER — Encounter: Payer: Self-pay | Admitting: Family

## 2020-05-18 ENCOUNTER — Ambulatory Visit: Payer: 59 | Admitting: Family

## 2020-05-18 VITALS — BP 110/70 | HR 86 | Temp 98.7°F | Ht 68.0 in | Wt 147.1 lb

## 2020-05-18 DIAGNOSIS — Z1159 Encounter for screening for other viral diseases: Secondary | ICD-10-CM | POA: Diagnosis not present

## 2020-05-18 DIAGNOSIS — Z125 Encounter for screening for malignant neoplasm of prostate: Secondary | ICD-10-CM

## 2020-05-18 DIAGNOSIS — Z1211 Encounter for screening for malignant neoplasm of colon: Secondary | ICD-10-CM

## 2020-05-18 DIAGNOSIS — E785 Hyperlipidemia, unspecified: Secondary | ICD-10-CM | POA: Diagnosis not present

## 2020-05-18 DIAGNOSIS — H00012 Hordeolum externum right lower eyelid: Secondary | ICD-10-CM

## 2020-05-18 DIAGNOSIS — E1165 Type 2 diabetes mellitus with hyperglycemia: Secondary | ICD-10-CM | POA: Diagnosis not present

## 2020-05-18 DIAGNOSIS — H00019 Hordeolum externum unspecified eye, unspecified eyelid: Secondary | ICD-10-CM | POA: Insufficient documentation

## 2020-05-18 NOTE — Assessment & Plan Note (Signed)
Improving. Advised warm compresses. He will let me know if doesn't resolve.

## 2020-05-18 NOTE — Assessment & Plan Note (Signed)
See DM note. Patient declines statin as  This time. Will follow.

## 2020-05-18 NOTE — Patient Instructions (Addendum)
Referral for colonoscopy  Let us know if you dont hear back within a week in regards to an appointment being scheduled.    Let me know if stye doesn't resolve with warm compresses. Please let me know and call your eye doctor if not.

## 2020-05-18 NOTE — Progress Notes (Signed)
Subjective:    Patient ID: Jim [name], male    DOB: October 15, 1965, 54 y.o.   MRN: [id number]  CC: Jim [name] is a 54 y.o. male who presents today for follow up and preventive maintenance  HPI: Complains of right bottom lid lesion x one week, improving. Has been sore however improved. Has been using erythromycin ointment old prescription. No warm compresses.   No contact lenses.  No vision changes, eye redness, eye pain, foreign body sensation, purulent discharge   DM-Compliant with metformin 750 mg, glipizide5mg , trulicity 1.5mg  ; had followed with Jim [name], last seen 09/2019; desires for me to follow DM      HISTORY:  Past Medical History:  Diagnosis Date  . Diabetes mellitus 2012  . GERD (gastroesophageal reflux disease)    Past Surgical History:  Procedure Laterality Date  . CLOSED REDUCTION ANKLE FRACTURE     Family History  Problem Relation Age of Onset  . Stroke Paternal Grandfather   . Diabetes Brother   . Parkinsonism Maternal Grandfather     Allergies: Ciprofloxacin Current Outpatient Medications on File Prior to Visit  Medication Sig Dispense Refill  . acetaminophen (TYLENOL) 500 MG tablet Take 500 mg by mouth every 6 (six) hours as needed.    . Dulaglutide (TRULICITY) 1.5 PJ/0.9TO SOPN INJECT 1 PEN UNDER THE SKIN WEEKLY IN THE MORNING 12 pen 3  . glipiZIDE (GLUCOTROL) 5 MG tablet TAKE 1 TABLET BY MOUTH TWICE DAILY BEFORE A MEAL 180 tablet 3  . Glucose Blood (BLOOD GLUCOSE TEST STRIPS) STRP True Result test strips, to test blood glucose 3 times daily, use as directed 100 each 5  . ibuprofen (ADVIL) 600 MG tablet Take 1 tablet (600 mg total) by mouth in the morning and at bedtime. 60 tablet 0  . Insulin Pen Needle 32G X 4 MM MISC Use 1x a day 100 each 3  . metFORMIN (GLUCOPHAGE-XR) 750 MG 24 hr tablet TAKE 1 TABLET (750 MG TOTAL) BY MOUTH 2 (TWO) TIMES DAILY. 60 tablet 1  . sildenafil (VIAGRA) 100 MG tablet Take 1 tablet (100 mg total) by mouth daily as  needed for erectile dysfunction. 3 tablet 11  . TRUEPLUS LANCETS 30G MISC USE AS DIRECTED 100 each 11   No current facility-administered medications on file prior to visit.    Social History   Tobacco Use  . Smoking status: Never Smoker  . Smokeless tobacco: Never Used  Substance Use Topics  . Alcohol use: Yes    Comment: ocassionally  . Drug use: No    Review of Systems  Constitutional: Negative for chills and fever.  Eyes: Positive for pain. Negative for photophobia, discharge, redness, itching and visual disturbance.  Respiratory: Negative for cough.   Cardiovascular: Negative for chest pain and palpitations.  Gastrointestinal: Negative for nausea and vomiting.  Neurological: Negative for numbness.      Objective:    BP 110/70   Pulse 86   Temp 98.7 F (37.1 C) (Oral)   Ht 5\' 8"  (1.727 m)   Wt 147 lb 1.9 oz (66.7 kg)   SpO2 99%   BMI 22.37 kg/m  BP Readings from Last 3 Encounters:  05/18/20 110/70  05/14/20 122/82  03/09/20 104/72   Wt Readings from Last 3 Encounters:  05/18/20 147 lb 1.9 oz (66.7 kg)  05/14/20 147 lb (66.7 kg)  03/09/20 144 lb (65.3 kg)    Physical Exam Vitals reviewed.  Constitutional:      Appearance: He is well-developed.  HENT:     Head: Normocephalic and atraumatic.     Right Ear: Hearing, tympanic membrane, ear canal and external ear normal. No decreased hearing noted. No drainage, swelling or tenderness. No middle ear effusion. Tympanic membrane is not injected, erythematous or bulging.     Left Ear: Hearing, tympanic membrane, ear canal and external ear normal. No decreased hearing noted. No drainage, swelling or tenderness.  No middle ear effusion. Tympanic membrane is not injected, erythematous or bulging.     Nose: Nose normal.     Right Sinus: No maxillary sinus tenderness or frontal sinus tenderness.     Left Sinus: No maxillary sinus tenderness or frontal sinus tenderness.     Mouth/Throat:     Pharynx: Uvula midline. No  oropharyngeal exudate or posterior oropharyngeal erythema.     Tonsils: No tonsillar abscesses.  Eyes:     General:        Right eye: Hordeolum present. No discharge.        Left eye: No discharge or hordeolum.     Extraocular Movements:     Right eye: Normal extraocular motion.     Left eye: Normal extraocular motion.     Conjunctiva/sclera: Conjunctivae normal.     Pupils: Pupils are equal, round, and reactive to light.     Comments: Right lower lid erythematous papule noted eye lash line.  Surrounding skin intact.   Right and left eye:   No injection of the conjunctiva. No white spots, opacity, or foreign body appreciated. No collection of blood or pus in the anterior chamber. No ciliary flush surrounding iris.   No photophobia or eye pain appreciated during exam.     Cardiovascular:     Rate and Rhythm: Regular rhythm.     Heart sounds: Normal heart sounds.  Pulmonary:     Effort: Pulmonary effort is normal. No respiratory distress.     Breath sounds: Normal breath sounds. No wheezing, rhonchi or rales.  Lymphadenopathy:     Head:     Right side of head: No submental, submandibular, tonsillar, preauricular, posterior auricular or occipital adenopathy.     Left side of head: No submental, submandibular, tonsillar, preauricular, posterior auricular or occipital adenopathy.     Cervical: No cervical adenopathy.     Right cervical: No superficial, deep or posterior cervical adenopathy.    Left cervical: No superficial, deep or posterior cervical adenopathy.  Skin:    General: Skin is warm and dry.  Neurological:     Mental Status: He is alert.  Psychiatric:        Speech: Speech normal.        Behavior: Behavior normal.        Assessment & Plan:   Problem List Items Addressed This Visit      Endocrine   Type 2 diabetes mellitus with hyperglycemia, without long-term current use of insulin ([place]) - Primary    Lab Results  Component Value Date   HGBA1C 7.4 (A)  09/16/2019   Uncontrolled. Pending a1c. Long discussion in regards to DM standard of care in regards to statin therapy and ACE I or ARB. Patient has not demonstrated wildly uncontrolled LDL cholesterol nor elevated blood pressure, proteinuria. He declines starting either medication at this time and would like to monitor. Continue  metformin 750 mg, glipizide5mg , trulicity 1.5mg . Will resume management of DM and consult endocrine as needed.      Relevant Orders   CBC with Differential/Platelet   Comprehensive metabolic panel  Hemoglobin A1c     Other   Hordeolum externum (stye)    Improving. Advised warm compresses. He will let me know if doesn't resolve.       Hyperlipidemia    See DM note. Patient declines statin as  This time. Will follow.       Other Visit Diagnoses    Screen for colon cancer       Relevant Orders   Ambulatory referral to Gastroenterology   Encounter for hepatitis C screening test for low risk patient       Relevant Orders   Hepatitis C antibody   Screening for prostate cancer       Relevant Orders   PSA       I have discontinued Tami Lin. Keel's predniSONE. I am also having him maintain his acetaminophen, sildenafil, TRUEplus Lancets 30G, BLOOD GLUCOSE TEST STRIPS, Insulin Pen Needle, glipiZIDE, Trulicity, metFORMIN, and ibuprofen.   No orders of the defined types were placed in this encounter.   Return precautions given.   Risks, benefits, and alternatives of the medications and treatment plan prescribed today were discussed, and patient expressed understanding.   Education regarding symptom management and diagnosis given to patient on AVS.  Continue to follow with Burnard Hawthorne, FNP for routine health maintenance.   Jim [name] and I agreed with plan.   Mable Paris, FNP

## 2020-05-18 NOTE — Assessment & Plan Note (Signed)
Lab Results  Component Value Date   HGBA1C 7.4 (A) 09/16/2019   Uncontrolled. Pending a1c. Long discussion in regards to DM standard of care in regards to statin therapy and ACE I or ARB. Patient has not demonstrated wildly uncontrolled LDL cholesterol nor elevated blood pressure, proteinuria. He declines starting either medication at this time and would like to monitor. Continue  metformin 750 mg, glipizide5mg , trulicity 1.5mg . Will resume management of DM and consult endocrine as needed.

## 2020-05-19 LAB — CBC WITH DIFFERENTIAL/PLATELET
Basophils Absolute: 0 10*3/uL (ref 0.0–0.1)
Basophils Relative: 0.1 % (ref 0.0–3.0)
Eosinophils Absolute: 0.2 10*3/uL (ref 0.0–0.7)
Eosinophils Relative: 1.9 % (ref 0.0–5.0)
HCT: 42.7 % (ref 39.0–52.0)
Hemoglobin: 14.5 g/dL (ref 13.0–17.0)
Lymphocytes Relative: 21.2 % (ref 12.0–46.0)
Lymphs Abs: 1.9 10*3/uL (ref 0.7–4.0)
MCHC: 34.1 g/dL (ref 30.0–36.0)
MCV: 86.2 fl (ref 78.0–100.0)
Monocytes Absolute: 1 10*3/uL (ref 0.1–1.0)
Monocytes Relative: 11.2 % (ref 3.0–12.0)
Neutro Abs: 5.7 10*3/uL (ref 1.4–7.7)
Neutrophils Relative %: 65.6 % (ref 43.0–77.0)
Platelets: 320 10*3/uL (ref 150.0–400.0)
RBC: 4.95 Mil/uL (ref 4.22–5.81)
RDW: 13.6 % (ref 11.5–15.5)
WBC: 8.8 10*3/uL (ref 4.0–10.5)

## 2020-05-19 LAB — HEMOGLOBIN A1C: Hgb A1c MFr Bld: 9.3 % — ABNORMAL HIGH (ref 4.6–6.5)

## 2020-05-19 LAB — COMPREHENSIVE METABOLIC PANEL
ALT: 20 U/L (ref 0–53)
AST: 12 U/L (ref 0–37)
Albumin: 4.3 g/dL (ref 3.5–5.2)
Alkaline Phosphatase: 79 U/L (ref 39–117)
BUN: 20 mg/dL (ref 6–23)
CO2: 26 mEq/L (ref 19–32)
Calcium: 9.6 mg/dL (ref 8.4–10.5)
Chloride: 100 mEq/L (ref 96–112)
Creatinine, Ser: 0.92 mg/dL (ref 0.40–1.50)
GFR: 95.68 mL/min (ref >60.00)
Glucose, Bld: 204 mg/dL — ABNORMAL HIGH (ref 70–99)
Potassium: 4.3 mEq/L (ref 3.5–5.1)
Sodium: 136 mEq/L (ref 135–145)
Total Bilirubin: 0.3 mg/dL (ref 0.2–1.2)
Total Protein: 6.3 g/dL (ref 6.0–8.3)

## 2020-05-19 LAB — HEPATITIS C ANTIBODY
Hepatitis C Ab: NONREACTIVE
SIGNAL TO CUT-OFF: 0.01 (ref <1.00)

## 2020-05-19 LAB — PSA: PSA: 0.58 ng/mL (ref 0.10–4.00)

## 2020-05-21 ENCOUNTER — Other Ambulatory Visit: Payer: Self-pay | Admitting: Family

## 2020-05-21 ENCOUNTER — Other Ambulatory Visit: Payer: Self-pay

## 2020-05-21 DIAGNOSIS — E1165 Type 2 diabetes mellitus with hyperglycemia: Secondary | ICD-10-CM

## 2020-05-21 MED ORDER — TRULICITY 3 MG/0.5ML ~~LOC~~ SOAJ: 3.0000 mg | SUBCUTANEOUS | 3 refills | Status: DC

## 2020-05-21 MED ORDER — TRULICITY 1.5 MG/0.5ML ~~LOC~~ SOAJ: 3.0000 mg | SUBCUTANEOUS | 3 refills | Status: DC

## 2020-05-21 MED ORDER — TRULICITY 3 MG/0.5ML ~~LOC~~ SOAJ
3.0000 mg | SUBCUTANEOUS | 3 refills | Status: DC
Start: 2020-05-21 — End: 2020-08-18

## 2020-05-21 MED FILL — TRULICITY 3 MG/0.5ML SOPN: 3 | 84 days supply | Qty: 6 | Fill #0

## 2020-05-26 ENCOUNTER — Other Ambulatory Visit: Payer: Self-pay

## 2020-05-26 ENCOUNTER — Telehealth (INDEPENDENT_AMBULATORY_CARE_PROVIDER_SITE_OTHER): Payer: Self-pay | Admitting: Gastroenterology

## 2020-05-26 DIAGNOSIS — Z1211 Encounter for screening for malignant neoplasm of colon: Secondary | ICD-10-CM

## 2020-05-26 MED ORDER — NA SULFATE-K SULFATE-MG SULF 17.5-3.13-1.6 GM/177ML PO SOLN
1.0000 | Freq: Once | ORAL | 0 refills | Status: DC
Start: 2020-05-26 — End: 2020-05-26

## 2020-05-26 MED FILL — SUPREP BOWEL PREP KIT: 17.5-3.13-1 | 1 days supply | Qty: 354 | Fill #0

## 2020-05-26 NOTE — Progress Notes (Signed)
Gastroenterology Pre-Procedure Review  Request Date: Friday 07/23/20 Requesting Physician: Dr. Vicente Males  PATIENT REVIEW QUESTIONS: The patient responded to the following health history questions as indicated:    1. Are you having any GI issues? no 2. Do you have a personal history of Polyps? no 3. Do you have a family history of Colon Cancer or Polyps? no 4. Diabetes Mellitus? yes 5. Joint replacements in the past 12 months?no 6. Major health problems in the past 3 months?no 7. Any artificial heart valves, MVP, or defibrillator?no    MEDICATIONS & ALLERGIES:    Patient reports the following regarding taking any anticoagulation/antiplatelet therapy:   Plavix, Coumadin, Eliquis, Xarelto, Lovenox, Pradaxa, Brilinta, or Effient? no Aspirin? no  Patient confirms/reports the following medications:  Current Outpatient Medications  Medication Sig Dispense Refill   acetaminophen (TYLENOL) 500 MG tablet Take 500 mg by mouth every 6 (six) hours as needed.     Dulaglutide (TRULICITY) 3 NO/0.3BC SOPN Inject 3 mg as directed once a week. 2 mL 3   glipiZIDE (GLUCOTROL) 5 MG tablet TAKE 1 TABLET BY MOUTH TWICE DAILY BEFORE A MEAL 180 tablet 3   Glucose Blood (BLOOD GLUCOSE TEST STRIPS) STRP True Result test strips, to test blood glucose 3 times daily, use as directed 100 each 5   ibuprofen (ADVIL) 600 MG tablet Take 1 tablet (600 mg total) by mouth in the morning and at bedtime. 60 tablet 0   Insulin Pen Needle 32G X 4 MM MISC Use 1x a day 100 each 3   metFORMIN (GLUCOPHAGE-XR) 750 MG 24 hr tablet TAKE 1 TABLET (750 MG TOTAL) BY MOUTH 2 (TWO) TIMES DAILY. 60 tablet 1   TRUEPLUS LANCETS 30G MISC USE AS DIRECTED 100 each 11   sildenafil (VIAGRA) 100 MG tablet Take 1 tablet (100 mg total) by mouth daily as needed for erectile dysfunction. (Patient not taking: Reported on 05/26/2020) 3 tablet 11   No current facility-administered medications for this visit.    Patient confirms/reports the  following allergies:  Allergies  Allergen Reactions   Ciprofloxacin Rash    No orders of the defined types were placed in this encounter.   AUTHORIZATION INFORMATION Primary Insurance: 1D#: Group #:  Secondary Insurance: 1D#: Group #:  SCHEDULE INFORMATION: Date: 07/23/20 Time: Location:[facility]

## 2020-06-02 ENCOUNTER — Other Ambulatory Visit (HOSPITAL_COMMUNITY): Payer: Self-pay | Admitting: Optometry

## 2020-06-02 MED FILL — NEO/POLY/DEXAMET EYE OINT: 3.5-10000-0 | 7 days supply | Qty: 4 | Fill #0

## 2020-06-14 ENCOUNTER — Other Ambulatory Visit: Payer: Self-pay | Admitting: Family

## 2020-06-14 ENCOUNTER — Other Ambulatory Visit: Payer: Self-pay | Admitting: Internal Medicine

## 2020-06-14 MED FILL — METFORMIN HCL ER 750 MG TB2: 750 | 30 days supply | Qty: 60 | Fill #0

## 2020-06-15 NOTE — Progress Notes (Signed)
[place] Phone: [phone] Subjective:   Jim [name], am serving as a scribe for Dr. Hulan Saas. This visit occurred during the SARS-CoV-2 public health emergency.  Safety protocols were in place, including screening questions prior to the visit, additional usage of staff PPE, and extensive cleaning of exam room while observing appropriate contact time as indicated for disinfecting solutions.   I'm seeing this patient by the request  of:  Burnard Hawthorne, FNP  CC: Back and neck pain follow-up, right shoulder pain follow-up  [id number]  Jim [name] is a 54 y.o. male coming in with complaint of back and neck pain. OMT 05/14/2020. Patient states that his shoulder pain has improved post injection. States that he is on IBU as well which has helped his pain. Does still feel shoulder pain if he lies on shoulder at night. Would state  He is 95% better overall    The glenohumeral junction May 14, 2020 as well as an acromioclavicular injection        Reviewed prior external information including notes and imaging from previsou exam, outside providers and external EMR if available.   As well as notes that were available from care everywhere and other healthcare systems.  Past medical history, social, surgical and family history all reviewed in electronic medical record.  [name] pertanent information unless stated regarding to the chief complaint.   Past Medical History:  Diagnosis Date  . Diabetes mellitus 2012  . GERD (gastroesophageal reflux disease)     Allergies  Allergen Reactions  . Ciprofloxacin Rash     Review of Systems:  [name] headache, visual changes, nausea, vomiting, diarrhea, constipation, dizziness, abdominal pain, skin rash, fevers, chills, night sweats, weight loss, swollen lymph nodes, body aches, joint swelling, chest pain, shortness of breath, mood changes. POSITIVE muscle  aches  Objective  Blood pressure 112/80, pulse (!) 106, height 5\' 8"  (1.727 m), weight 147 lb (66.7 kg), SpO2 96 %.   General: [name] apparent distress alert and oriented x3 mood and affect normal, dressed appropriately.  HEENT: Pupils equal, extraocular movements intact  Respiratory: Patient's speak in full sentences and does not appear short of breath  Cardiovascular: [name] lower extremity edema, non tender, [name] erythema  Right shoulder exam shows improvement in ROM, 4+/5 strength of RTC  Mild positive crossover.     Neck exam shows some mild loss of lordosis.  Tenderness to palpation in the paraspinal musculature right greater than left.  Some in the parascapular region right greater than left  Low back exam does show some mild tightness with straight leg test but [name] radicular symptoms.  5-5 strength in lower extremities.  Lacks last 5 degrees of sidebending bilaterally  Osteopathic findings  C6 flexed rotated and side bent right T3 extended rotated and side bent right inhaled rib T7 extended rotated and side bent left L2 flexed rotated and side bent right Sacrum right on right       Assessment and Plan:  AC joint pain Improved after injection  95% better  Discussed though still concern with the labral pathology or even a deep RTC tear, patient strength is good.  Discussed MRi Patient declined  RTC in 8 weeks   Lumbar radiculopathy Stable, discussed HEP discussed which activities to do     Nonallopathic problems  Decision today to treat with OMT was based on Physical Exam  After verbal consent patient was treated with HVLA, ME, FPR  techniques in cervical,  thoracic, lumbar, and sacral  areas   Patient tolerated the procedure well with improvement in symptoms  Patient given exercises, stretches and lifestyle modifications  See medications in patient instructions if given  Patient will follow up in 4-8 weeks      The above documentation has been reviewed and is  accurate and complete Lyndal Pulley, DO       Note: This dictation was prepared with Dragon dictation along with smaller phrase technology. Any transcriptional errors that result from this process are unintentional.

## 2020-06-16 ENCOUNTER — Other Ambulatory Visit: Payer: Self-pay

## 2020-06-16 ENCOUNTER — Encounter: Payer: Self-pay | Admitting: Family Medicine

## 2020-06-16 ENCOUNTER — Ambulatory Visit: Payer: 59 | Admitting: Family Medicine

## 2020-06-16 VITALS — BP 112/80 | HR 106 | Ht 68.0 in | Wt 147.0 lb

## 2020-06-16 DIAGNOSIS — M5416 Radiculopathy, lumbar region: Secondary | ICD-10-CM | POA: Diagnosis not present

## 2020-06-16 DIAGNOSIS — M999 Biomechanical lesion, unspecified: Secondary | ICD-10-CM

## 2020-06-16 DIAGNOSIS — M25511 Pain in right shoulder: Secondary | ICD-10-CM

## 2020-06-16 NOTE — Assessment & Plan Note (Signed)
Stable, discussed HEP discussed which activities to do

## 2020-06-16 NOTE — Patient Instructions (Signed)
Glad you are doing better Continue to monitor shoulder Neck and back is doing decent Continue exercises See me in 6-8 weeks

## 2020-06-16 NOTE — Assessment & Plan Note (Signed)
Improved after injection  95% better  Discussed though still concern with the labral pathology or even a deep RTC tear, patient strength is good.  Discussed MRi Patient declined  RTC in 8 weeks

## 2020-07-21 ENCOUNTER — Other Ambulatory Visit
Admission: RE | Admit: 2020-07-21 | Discharge: 2020-07-21 | Disposition: A | Discharge status: Home / Self Care | Payer: 59 | Source: Ambulatory Visit | Attending: Gastroenterology | Admitting: Gastroenterology

## 2020-07-21 ENCOUNTER — Other Ambulatory Visit: Payer: Self-pay

## 2020-07-21 DIAGNOSIS — Z20822 Contact with and (suspected) exposure to covid-19: Secondary | ICD-10-CM | POA: Insufficient documentation

## 2020-07-21 DIAGNOSIS — Z01812 Encounter for preprocedural laboratory examination: Secondary | ICD-10-CM | POA: Diagnosis not present

## 2020-07-22 LAB — SARS CORONAVIRUS 2 (TAT 6-24 HRS): SARS Coronavirus 2: NEGATIVE

## 2020-07-23 ENCOUNTER — Encounter
Admission: RE | Disposition: A | Discharge status: Home / Self Care | Payer: Self-pay | Source: Home / Self Care | Attending: Gastroenterology

## 2020-07-23 ENCOUNTER — Ambulatory Visit
Admission: RE | Admit: 2020-07-23 | Discharge: 2020-07-23 | Disposition: A | Discharge status: Home / Self Care | Payer: 59 | Attending: Gastroenterology | Admitting: Gastroenterology

## 2020-07-23 ENCOUNTER — Ambulatory Visit: Payer: 59 | Admitting: Certified Registered"

## 2020-07-23 ENCOUNTER — Encounter: Payer: Self-pay | Admitting: Gastroenterology

## 2020-07-23 DIAGNOSIS — Z79899 Other long term (current) drug therapy: Secondary | ICD-10-CM | POA: Insufficient documentation

## 2020-07-23 DIAGNOSIS — Z1211 Encounter for screening for malignant neoplasm of colon: Secondary | ICD-10-CM | POA: Insufficient documentation

## 2020-07-23 DIAGNOSIS — K635 Polyp of colon: Secondary | ICD-10-CM | POA: Diagnosis not present

## 2020-07-23 DIAGNOSIS — Z794 Long term (current) use of insulin: Secondary | ICD-10-CM | POA: Insufficient documentation

## 2020-07-23 HISTORY — PX: COLONOSCOPY WITH PROPOFOL: SHX5780

## 2020-07-23 LAB — GLUCOSE, CAPILLARY: Glucose-Capillary: 202 mg/dL — ABNORMAL HIGH (ref 70–99)

## 2020-07-23 SURGERY — COLONOSCOPY WITH PROPOFOL
Anesthesia: General

## 2020-07-23 MED ORDER — LIDOCAINE HCL (CARDIAC) PF 100 MG/5ML IV SOSY
PREFILLED_SYRINGE | INTRAVENOUS | Status: DC | PRN
  Administered 2020-07-23: 100 mg via INTRAVENOUS

## 2020-07-23 MED ORDER — GLYCOPYRROLATE 0.2 MG/ML IJ SOLN
INTRAMUSCULAR | Status: DC | PRN
  Administered 2020-07-23: .2 mg via INTRAVENOUS

## 2020-07-23 MED ORDER — PROPOFOL 10 MG/ML IV BOLUS
INTRAVENOUS | Status: DC | PRN
  Administered 2020-07-23: 20 mg via INTRAVENOUS
  Administered 2020-07-23: 60 mg via INTRAVENOUS

## 2020-07-23 MED ORDER — PROPOFOL 500 MG/50ML IV EMUL
INTRAVENOUS | Status: DC | PRN
  Administered 2020-07-23: 160 ug/kg/min via INTRAVENOUS

## 2020-07-23 MED ORDER — SODIUM CHLORIDE 0.9 % IV SOLN: INTRAVENOUS | Status: DC

## 2020-07-23 MED FILL — METFORMIN HCL ER 750 MG TB2: 750 | 30 days supply | Qty: 60 | Fill #1

## 2020-07-23 NOTE — Anesthesia Postprocedure Evaluation (Signed)
Anesthesia Post Note  Patient: Glyn Gerads [place]  Procedure(s) Performed: COLONOSCOPY WITH PROPOFOL (N/A )  Patient location during evaluation: Endoscopy Anesthesia Type: General Level of consciousness: awake and alert Pain management: pain level controlled Vital Signs Assessment: post-procedure vital signs reviewed and stable Respiratory status: spontaneous breathing, nonlabored ventilation, respiratory function stable and patient connected to nasal cannula oxygen Cardiovascular status: blood pressure returned to baseline and stable Postop Assessment: no apparent nausea or vomiting Anesthetic complications: no   No complications documented.   Last Vitals:  Vitals:   07/23/20 0841 07/23/20 0851  BP: 112/89 119/78  Pulse: 91 78  Resp: 15 12  Temp:    SpO2: 100% 100%    Last Pain:  Vitals:   07/23/20 0851  TempSrc:   PainSc: 0-No pain                 Martha Clan

## 2020-07-23 NOTE — Transfer of Care (Signed)
Immediate Anesthesia Transfer of Care Note  Patient: Jim [name]  Procedure(s) Performed: COLONOSCOPY WITH PROPOFOL (N/A )  Patient Location: Endoscopy Unit  Anesthesia Type:General  Level of Consciousness: drowsy and patient cooperative  Airway & Oxygen Therapy: Patient Spontanous Breathing and Patient connected to face mask oxygen  Post-op Assessment: Report given to RN and Post -op Vital signs reviewed and stable  Post vital signs: Reviewed and stable  Last Vitals:  Vitals Value Taken Time  BP 106/65 07/23/20 0821  Temp    Pulse 91 07/23/20 0822  Resp 22 07/23/20 0822  SpO2 100 % 07/23/20 0822  Vitals shown include unvalidated device data.  Last Pain:  Vitals:   07/23/20 0728  TempSrc: Skin  PainSc: 0-No pain         Complications: No complications documented.

## 2020-07-23 NOTE — Op Note (Signed)
[facility] Patient Name: Jim [name] Procedure Date: 07/23/2020 7:19 AM MRN: [id number] Account #: [id number] Date of Birth: 01/13/1966 Admit Type: Outpatient Age: 55 Room: [facility] ENDO ROOM 4 Gender: Male Note Status: Finalized Procedure:             Colonoscopy Indications:           Screening for colorectal malignant neoplasm Providers:             Jonathon Bellows MD, MD Medicines:             Monitored Anesthesia Care Complications:         No immediate complications. Procedure:             Pre-Anesthesia Assessment:                        - Prior to the procedure, a History and Physical was                         performed, and patient medications, allergies and                         sensitivities were reviewed. The patient's tolerance                         of previous anesthesia was reviewed.                        - The risks and benefits of the procedure and the                         sedation options and risks were discussed with the                         patient. All questions were answered and informed                         consent was obtained.                        - ASA Grade Assessment: II - A patient with mild                         systemic disease.                        After obtaining informed consent, the colonoscope was                         passed under direct vision. Throughout the procedure,                         the patient's blood pressure, pulse, and oxygen                         saturations were monitored continuously. The                         Colonoscope was introduced through the and advanced to  the the cecum, identified by the appendiceal orifice.                         The colonoscopy was performed with ease. The patient                         tolerated the procedure well. The quality of the bowel                         preparation was excellent. Findings:      The  perianal and digital rectal examinations were normal.      A 5 mm polyp was found in the cecum. The polyp was sessile. The polyp       was removed with a cold snare. Resection and retrieval were complete.      The exam was otherwise without abnormality on direct and retroflexion       views. Impression:            - One 5 mm polyp in the cecum, removed with a cold                         snare. Resected and retrieved.                        - The examination was otherwise normal on direct and                         retroflexion views. Recommendation:        - Discharge patient to home (with escort).                        - Resume previous diet.                        - Continue present medications.                        - Await pathology results.                        - Repeat colonoscopy for surveillance based on                         pathology results. Procedure Code(s):     --- Professional ---                        [phone], Colonoscopy, flexible; with removal of                         tumor(s), polyp(s), or other lesion(s) by snare                         technique Diagnosis Code(s):     --- Professional ---                        Z12.11, Encounter for screening for malignant neoplasm                         of colon  K63.5, Polyp of colon CPT copyright 2019 [employer]. All rights reserved. The codes documented in this report are preliminary and upon coder review may  be revised to meet current compliance requirements. Jonathon Bellows, MD Jonathon Bellows MD, MD 07/23/2020 8:19:30 AM This report has been signed electronically. Number of Addenda: 0 Note Initiated On: 07/23/2020 7:19 AM Scope Withdrawal Time: 0 hours 12 minutes 35 seconds  Total Procedure Duration: 0 hours 19 minutes 0 seconds  Estimated Blood Loss:  Estimated blood loss: none.      [facility]

## 2020-07-23 NOTE — H&P (Signed)
Jonathon Bellows, MD [address], [place], [place], [place], [address] [address], [place], [place], [place], [address] Phone: [phone]  Fax: [phone]  Primary Care Physician:  Burnard Hawthorne, FNP   Pre-Procedure History & Physical: HPI:  Jim [name] is a 55 y.o. male is here for an colonoscopy.   Past Medical History:  Diagnosis Date  . Diabetes mellitus 2012  . GERD (gastroesophageal reflux disease)     Past Surgical History:  Procedure Laterality Date  . CLOSED REDUCTION ANKLE FRACTURE    . NO PAST SURGERIES      Prior to Admission medications   Medication Sig Start Date End Date Taking? Authorizing Provider  acetaminophen (TYLENOL) 500 MG tablet Take 500 mg by mouth every 6 (six) hours as needed.    [provider]  Dulaglutide (TRULICITY) 3 BM/8.4XL SOPN Inject 3 mg as directed once a week. 05/21/20   Burnard Hawthorne, FNP  glipiZIDE (GLUCOTROL) 5 MG tablet TAKE 1 TABLET BY MOUTH TWICE DAILY BEFORE A MEAL 09/16/19   Philemon Kingdom, MD  Glucose Blood (BLOOD GLUCOSE TEST STRIPS) STRP True Result test strips, to test blood glucose 3 times daily, use as directed 11/22/16   Coral Spikes, DO  ibuprofen (ADVIL) 600 MG tablet Take 1 tablet (600 mg total) by mouth in the morning and at bedtime. 05/14/20   Lyndal Pulley, DO  Insulin Pen Needle 32G X 4 MM MISC Use 1x a day 06/16/19   Philemon Kingdom, MD  metFORMIN (GLUCOPHAGE-XR) 750 MG 24 hr tablet TAKE 1 TABLET BY MOUTH 2 TIMES DAILY. 06/14/20   Burnard Hawthorne, FNP  sildenafil (VIAGRA) 100 MG tablet Take 1 tablet (100 mg total) by mouth daily as needed for erectile dysfunction. 01/26/16   Jackolyn Confer, MD  TRUEPLUS LANCETS 30G MISC USE AS DIRECTED 11/22/16   Coral Spikes, DO    Allergies as of 05/26/2020 - Review Complete 05/26/2020  Allergen Reaction Noted  . Ciprofloxacin Rash 02/16/2012    Family History  Problem Relation Age of Onset  . Stroke Paternal Grandfather   .  Diabetes Brother   . Parkinsonism Maternal Grandfather     Social History   Socioeconomic History  . Marital status: Married    Spouse name: Not on file  . Number of children: Not on file  . Years of education: Not on file  . Highest education level: Not on file  Occupational History  . Not on file  Tobacco Use  . Smoking status: Never Smoker  . Smokeless tobacco: Never Used  Substance and Sexual Activity  . Alcohol use: Yes    Comment: ocassionally  . Drug use: No  . Sexual activity: Not on file  Other Topics Concern  . Not on file  Social History Narrative   Works for [employer]   . Lives in [place]. 2 children, daughter 34YO and son 11YO   Social Determinants of Radio broadcast assistant Strain: Not on file  Food Insecurity: Not on file  Transportation Needs: Not on file  Physical Activity: Not on file  Stress: Not on file  Social Connections: Not on file  Intimate Partner Violence: Not on file    Review of Systems: See HPI, otherwise negative ROS  Physical Exam: BP 113/70   Pulse 90   Temp (!) 97.5 F (36.4 C) (Skin)   Resp 18   Ht 5\' 9"  (1.753 m)   Wt 66.7 kg  SpO2 99%   BMI 21.71 kg/m  General:   Alert,  pleasant and cooperative in NAD Head:  Normocephalic and atraumatic. Neck:  Supple; no masses or thyromegaly. Lungs:  Clear throughout to auscultation, normal respiratory effort.    Heart:  +S1, +S2, Regular rate and rhythm, No edema. Abdomen:  Soft, nontender and nondistended. Normal bowel sounds, without guarding, and without rebound.   Neurologic:  Alert and  oriented x4;  grossly normal neurologically.  Impression/Plan: Jim [name] is here for an colonoscopy to be performed for Screening colonoscopy average risk   Risks, benefits, limitations, and alternatives regarding  colonoscopy have been reviewed with the patient.  Questions have been answered.  All parties agreeable.   Jonathon Bellows, MD  07/23/2020, 7:53 AM

## 2020-07-23 NOTE — Anesthesia Procedure Notes (Signed)
Procedure Name: General with mask airway Performed by: Kelton Pillar, CRNA Pre-anesthesia Checklist: Patient identified, Emergency Drugs available, Suction available and Patient being monitored Patient Re-evaluated:Patient Re-evaluated prior to induction Preoxygenation: Pre-oxygenation with 100% oxygen Induction Type: IV induction Placement Confirmation: positive ETCO2 and CO2 detector Dental Injury: Teeth and Oropharynx as per pre-operative assessment

## 2020-07-23 NOTE — Anesthesia Preprocedure Evaluation (Signed)
Anesthesia Evaluation  Patient identified by MRN, date of birth, ID band Patient awake    Reviewed: Allergy & Precautions, H&P , NPO status , Patient's Chart, lab work & pertinent test results, reviewed documented beta blocker date and time   History of Anesthesia Complications Negative for: history of anesthetic complications  Airway Mallampati: I  TM Distance: >3 FB Neck ROM: full    Dental  (+) Dental Advidsory Given, Caps, Teeth Intact   Pulmonary neg pulmonary ROS,    Pulmonary exam normal breath sounds clear to auscultation       Cardiovascular Exercise Tolerance: Good negative cardio ROS Normal cardiovascular exam Rhythm:regular Rate:Normal     Neuro/Psych negative neurological ROS  negative psych ROS   GI/Hepatic Neg liver ROS, GERD  ,  Endo/Other  diabetes  Renal/GU negative Renal ROS  negative genitourinary   Musculoskeletal   Abdominal   Peds  Hematology negative hematology ROS (+)   Anesthesia Other Findings Past Medical History: 2012: Diabetes mellitus No date: GERD (gastroesophageal reflux disease)   Reproductive/Obstetrics negative OB ROS                             Anesthesia Physical Anesthesia Plan  ASA: II  Anesthesia Plan: General   Post-op Pain Management:    Induction: Intravenous  PONV Risk Score and Plan: 2 and Propofol infusion and TIVA  Airway Management Planned: Natural Airway and Nasal Cannula  Additional Equipment:   Intra-op Plan:   Post-operative Plan:   Informed Consent: I have reviewed the patients History and Physical, chart, labs and discussed the procedure including the risks, benefits and alternatives for the proposed anesthesia with the patient or authorized representative who has indicated his/her understanding and acceptance.     Dental Advisory Given  Plan Discussed with: Anesthesiologist, CRNA and Surgeon  Anesthesia Plan  Comments:         Anesthesia Quick Evaluation

## 2020-07-26 ENCOUNTER — Encounter: Payer: Self-pay | Admitting: Gastroenterology

## 2020-07-26 LAB — SURGICAL PATHOLOGY

## 2020-08-06 ENCOUNTER — Ambulatory Visit: Payer: 59 | Admitting: Family Medicine

## 2020-08-13 MED FILL — glipiZIDE 5 MG TABS: 5 | 90 days supply | Qty: 180 | Fill #3

## 2020-08-18 ENCOUNTER — Telehealth (INDEPENDENT_AMBULATORY_CARE_PROVIDER_SITE_OTHER): Payer: 59 | Admitting: Family

## 2020-08-18 ENCOUNTER — Other Ambulatory Visit: Payer: Self-pay | Admitting: Family

## 2020-08-18 ENCOUNTER — Ambulatory Visit: Payer: 59 | Admitting: Family

## 2020-08-18 DIAGNOSIS — E1165 Type 2 diabetes mellitus with hyperglycemia: Secondary | ICD-10-CM | POA: Diagnosis not present

## 2020-08-18 MED ORDER — TRULICITY 3 MG/0.5ML ~~LOC~~ SOAJ: 3.0000 mg | SUBCUTANEOUS | 3 refills | Status: DC

## 2020-08-18 MED FILL — TRULICITY 3 MG/0.5ML SOPN: 3 | 84 days supply | Qty: 6 | Fill #0

## 2020-08-18 NOTE — Progress Notes (Unsigned)
Virtual Visit via Video Note  I connected with@  on 08/20/20 at  2:00 PM EST by a video enabled telemedicine application and verified that I am speaking with the correct person using two identifiers.  Location patient: home Location provider:work  Persons participating in the virtual visit: patient, provider  I discussed the limitations of evaluation and management by telemedicine and the availability of in person appointments. The patient expressed understanding and agreed to proceed.   HPI: Feels well today No complaints    DM- FBG 150's-200 .  never started Trulcity 3mg , has been on 1.5mg  . Compliant metformin 750mg  BID, glipizide 5mg  BID  Doesn't want to loose any more weight  Had been on lantus 10-18 units in the past. Working on eating less carbs. No hypoglycemic episodes. Jardiance caused weight loss.   [employer] 03/05/20.    ROS: See pertinent positives and negatives per HPI.    EXAM:  VITALS per patient if applicable: There were no vitals taken for this visit. BP Readings from Last 3 Encounters:  08/20/20 110/72  07/23/20 119/78  06/16/20 112/80   Wt Readings from Last 3 Encounters:  08/20/20 149 lb (67.6 kg)  07/23/20 147 lb (66.7 kg)  06/16/20 147 lb (66.7 kg)    GENERAL: alert, oriented, appears well and in no acute distress  HEENT: atraumatic, conjunttiva clear, no obvious abnormalities on inspection of external nose and ears  NECK: normal movements of the head and neck  LUNGS: on inspection no signs of respiratory distress, breathing rate appears normal, no obvious gross SOB, gasping or wheezing  CV: no obvious cyanosis  MS: moves all visible extremities without noticeable abnormality  PSYCH/NEURO: pleasant and cooperative, no obvious depression or anxiety, speech and thought processing grossly intact  ASSESSMENT AND PLAN:  Discussed the following assessment and plan:  Problem List Items Addressed This Visit      Endocrine   Type  2 diabetes mellitus with hyperglycemia, without long-term current use of insulin ([place]) - Primary    Uncontrolled. Increase toTrulcity 3mg . Continue metformin 750mg  BID, glipizide 5mg  BID. Will monitor closely for weight loss and consider lantus if patient looses significant weight on increased trulicity.        Relevant Medications   Dulaglutide (TRULICITY) 3 BO/1.7PZ SOPN   Other Relevant Orders   Comprehensive metabolic panel   Microalbumin / creatinine urine ratio   Hemoglobin A1c   Lipid panel      -we discussed possible serious and likely etiologies, options for evaluation and workup, limitations of telemedicine visit vs in person visit, treatment, treatment risks and precautions. Pt prefers to treat via telemedicine empirically rather then risking or undertaking an in person visit at this moment.  .   I discussed the assessment and treatment plan with the patient. The patient was provided an opportunity to ask questions and all were answered. The patient agreed with the plan and demonstrated an understanding of the instructions.   The patient was advised to call back or seek an in-person evaluation if the symptoms worsen or if the condition fails to improve as anticipated.   Mable Paris, FNP

## 2020-08-19 ENCOUNTER — Telehealth: Payer: Self-pay

## 2020-08-19 NOTE — Progress Notes (Signed)
[facility] [address] [place] Phone: [phone] Subjective:   I Jim [name] am serving as a Education administrator for Dr. Hulan Saas.  This visit occurred during the SARS-CoV-2 public health emergency.  Safety protocols were in place, including screening questions prior to the visit, additional usage of staff PPE, and extensive cleaning of exam room while observing appropriate contact time as indicated for disinfecting solutions.   I'm seeing this patient by the request  of:  Burnard Hawthorne, FNP  CC: Right shoulder and back pain follow-up  XNA:[id number]  Jim [name] is a 55 y.o. male coming in with complaint of back and neck pain. OMT 06/16/2020. Patient states he feels shoulder pain sometimes. Not taking as much advil.  Patient does feel like he is improved.  Was able to do around the golf with very minimal discomfort.  Patient states able to sleep comfortably.  When he does take the ibuprofen it does seem to get better.  Has not noticed any new symptoms such as radicular symptoms or radiation down the arm.  Medications patient has been prescribed: None           Reviewed prior external information including notes and imaging from previsou exam, outside providers and external EMR if available.   As well as notes that were available from care everywhere and other healthcare systems.  Past medical history, social, surgical and family history all reviewed in electronic medical record.  No pertanent information unless stated regarding to the chief complaint.   Past Medical History:  Diagnosis Date  . Diabetes mellitus 2012  . GERD (gastroesophageal reflux disease)     Allergies  Allergen Reactions  . Jardiance [Empagliflozin]     Fatigue, weight loss  . Ciprofloxacin Rash     Review of Systems:  No headache, visual changes, nausea, vomiting, diarrhea, constipation, dizziness, abdominal pain, skin rash, fevers, chills, night  sweats, weight loss, swollen lymph nodes, body aches, joint swelling, chest pain, shortness of breath, mood changes. POSITIVE muscle aches  Objective  Blood pressure 110/72, pulse (!) 110, height 5\' 9"  (1.753 m), weight 149 lb (67.6 kg), SpO2 98 %.   General: No apparent distress alert and oriented x3 mood and affect normal, dressed appropriately.  HEENT: Pupils equal, extraocular movements intact  Respiratory: Patient's speak in full sentences and does not appear short of breath  Cardiovascular: No lower extremity edema, non tender, no erythema  Neuro: Cranial nerves II through XII are intact, neurovascularly intact in all extremities with 2+ DTRs and 2+ pulses.  Gait normal with good balance and coordination.  MSK:  Non tender with full range of motion and good stability and symmetric strength and tone of shoulders, elbows, wrist, hip, knee and ankles bilaterally.  Back -low back exam does have some mild loss of lordosis.  Some tenderness to palpation of the paraspinal musculature.  Negative straight leg test.  Negative FABER test.  Patient mild tightness in the thoracolumbar juncture. Neck exam mild tightness on the right side of the neck.  Negative Spurling's.   Osteopathic findings  C2 flexed rotated and side bent right T3 extended rotated and side bent right i L1 flexed rotated and side bent right Sacrum right on right       Assessment and Plan:  Right shoulder pain Patient continues to have more of the right shoulder pain.  We discussed with patient again at great length.  I would like to consider the possibility of  advanced imaging.  Patient though does not have significant weakness and is able to do daily activities and has even decreased the amount of anti-inflammatories.  Patient would like to continue with conservative therapy at this point.  Declined advanced imaging.  We will discuss with him again in 6 to 8 weeks.  He did make it aware if there is a rotator cuff tear wait  too long he could not be surgical fix.  Patient understands this and feels like he is doing relatively well.  Lumbar radiculopathy Low back is doing very well as well.  Chronic problem with mild tightness noted.  Have done a round of golf recently.  Patient is responding well to osteopathic manipulation.  Follow-up again 6 to 8 weeks    Nonallopathic problems  Decision today to treat with OMT was based on Physical Exam  After verbal consent patient was treated with HVLA, ME, FPR techniques in cervical,  thoracic, lumbar, and sacral  areas  Patient tolerated the procedure well with improvement in symptoms  Patient given exercises, stretches and lifestyle modifications  See medications in patient instructions if given  Patient will follow up in 4-8 weeks      The above documentation has been reviewed and is accurate and complete Lyndal Pulley, DO       Note: This dictation was prepared with Dragon dictation along with smaller phrase technology. Any transcriptional errors that result from this process are unintentional.

## 2020-08-20 ENCOUNTER — Encounter: Payer: Self-pay | Admitting: Family Medicine

## 2020-08-20 ENCOUNTER — Ambulatory Visit: Payer: 59 | Admitting: Family Medicine

## 2020-08-20 ENCOUNTER — Other Ambulatory Visit: Payer: Self-pay

## 2020-08-20 DIAGNOSIS — M999 Biomechanical lesion, unspecified: Secondary | ICD-10-CM | POA: Diagnosis not present

## 2020-08-20 DIAGNOSIS — M25511 Pain in right shoulder: Secondary | ICD-10-CM

## 2020-08-20 DIAGNOSIS — G8929 Other chronic pain: Secondary | ICD-10-CM | POA: Diagnosis not present

## 2020-08-20 DIAGNOSIS — M5416 Radiculopathy, lumbar region: Secondary | ICD-10-CM

## 2020-08-20 NOTE — Patient Instructions (Signed)
Good to see you Shoulder has good strength. Next step would be MRI See me again in 6-8 weeks to follow up

## 2020-08-20 NOTE — Assessment & Plan Note (Addendum)
Uncontrolled. Increase toTrulcity 3mg . Continue metformin 750mg  BID, glipizide 5mg  BID. Will monitor closely for weight loss and consider lantus if patient looses significant weight on increased trulicity.

## 2020-08-20 NOTE — Assessment & Plan Note (Signed)
Patient continues to have more of the right shoulder pain.  We discussed with patient again at great length.  I would like to consider the possibility of advanced imaging.  Patient though does not have significant weakness and is able to do daily activities and has even decreased the amount of anti-inflammatories.  Patient would like to continue with conservative therapy at this point.  Declined advanced imaging.  We will discuss with him again in 6 to 8 weeks.  He did make it aware if there is a rotator cuff tear wait too long he could not be surgical fix.  Patient understands this and feels like he is doing relatively well.

## 2020-08-20 NOTE — Assessment & Plan Note (Signed)
Low back is doing very well as well.  Chronic problem with mild tightness noted.  Have done a round of golf recently.  Patient is responding well to osteopathic manipulation.  Follow-up again 6 to 8 weeks

## 2020-08-24 ENCOUNTER — Other Ambulatory Visit: Payer: Self-pay | Admitting: Family

## 2020-08-24 ENCOUNTER — Other Ambulatory Visit: Payer: Self-pay | Admitting: Internal Medicine

## 2020-08-24 MED FILL — METFORMIN HCL ER 750 MG TB2: 750 | 30 days supply | Qty: 60 | Fill #0

## 2020-09-03 ENCOUNTER — Other Ambulatory Visit (INDEPENDENT_AMBULATORY_CARE_PROVIDER_SITE_OTHER): Payer: 59

## 2020-09-03 ENCOUNTER — Other Ambulatory Visit: Payer: Self-pay

## 2020-09-03 DIAGNOSIS — E1165 Type 2 diabetes mellitus with hyperglycemia: Secondary | ICD-10-CM | POA: Diagnosis not present

## 2020-09-03 LAB — COMPREHENSIVE METABOLIC PANEL
ALT: 21 U/L (ref 0–53)
AST: 12 U/L (ref 0–37)
Albumin: 4.4 g/dL (ref 3.5–5.2)
Alkaline Phosphatase: 85 U/L (ref 39–117)
BUN: 20 mg/dL (ref 6–23)
CO2: 26 mEq/L (ref 19–32)
Calcium: 9.3 mg/dL (ref 8.4–10.5)
Chloride: 98 mEq/L (ref 96–112)
Creatinine, Ser: 0.94 mg/dL (ref 0.40–1.50)
GFR: 93.05 mL/min (ref >60.00)
Glucose, Bld: 214 mg/dL — ABNORMAL HIGH (ref 70–99)
Potassium: 4.3 mEq/L (ref 3.5–5.1)
Sodium: 135 mEq/L (ref 135–145)
Total Bilirubin: 0.5 mg/dL (ref 0.2–1.2)
Total Protein: 6.6 g/dL (ref 6.0–8.3)

## 2020-09-03 LAB — LIPID PANEL
Cholesterol: 152 mg/dL (ref 0–200)
HDL: 37.5 mg/dL — ABNORMAL LOW (ref >39.00)
LDL Cholesterol: 76 mg/dL (ref 0–99)
NonHDL: 114.96
Total CHOL/HDL Ratio: 4
Triglycerides: 193 mg/dL — ABNORMAL HIGH (ref 0.0–149.0)
VLDL: 38.6 mg/dL (ref 0.0–40.0)

## 2020-09-03 LAB — HEMOGLOBIN A1C: Hgb A1c MFr Bld: 9.2 % — ABNORMAL HIGH (ref 4.6–6.5)

## 2020-09-03 LAB — MICROALBUMIN / CREATININE URINE RATIO
Creatinine,U: 161.7 mg/dL
Microalb Creat Ratio: 0.8 mg/g (ref 0.0–30.0)
Microalb, Ur: 1.3 mg/dL (ref 0.0–1.9)

## 2020-09-03 NOTE — Addendum Note (Signed)
Addended by: Ezequiel Ganser on: 09/03/2020 02:53 PM   Modules accepted: Orders

## 2020-09-17 ENCOUNTER — Ambulatory Visit: Payer: 59 | Admitting: Family

## 2020-09-17 NOTE — Telephone Encounter (Signed)
Error.  Jim [name],cma  

## 2020-10-14 NOTE — Progress Notes (Signed)
[facility] Phone: [phone] Subjective:    I'm seeing this patient by the request  of:  Burnard Hawthorne, FNP  CC: Neck pain, back pain, shoulder pain  EUM:[id number]  Jim [name] is a 55 y.o. male coming in with complaint of back and neck pain. OMT 08/20/2020. Patient states improvement in back pain since last visit. Pt was doing some stretches and felt his low back "shift" but resolved after a few days. Pt reports neck is OK.  Pt c/o pain in R shoulder/chest. Pt locates pain to anterior aspect of shoulder and chest, inferior to clavicle. 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  Medications patient has been prescribed: None          Reviewed prior external information including notes and imaging from previsou exam, outside providers and external EMR if available.   As well as notes that were available from care everywhere and other healthcare systems.  Past medical history, social, surgical and family history all reviewed in electronic medical record.  No pertanent information unless stated regarding to the chief complaint.   Past Medical History:  Diagnosis Date  . Diabetes mellitus 2012  . GERD (gastroesophageal reflux disease)     Allergies  Allergen Reactions  . Jardiance [Empagliflozin]     Fatigue, weight loss  . Ciprofloxacin Rash     Review of Systems:  No headache, visual changes, nausea, vomiting, diarrhea, constipation, dizziness, abdominal pain, skin rash, fevers, chills, night sweats, weight loss, swollen lymph nodes, body aches, joint swelling, chest pain, shortness of breath, mood changes. POSITIVE muscle aches  Objective  Blood pressure 108/74, pulse (!) 108, height 5\' 9"  (1.753 m), weight 145 lb 12.8 oz (66.1 kg), SpO2 96 %.   General: No apparent distress alert and oriented x3 mood and affect normal, dressed appropriately.  HEENT: Pupils  equal, extraocular movements intact  Respiratory: Patient's speak in full sentences and does not appear short of breath  Cardiovascular: No lower extremity edema, non tender, no erythema  Gait normal with good balance and coordination.  MSK: Right shoulder exam still has some mild positive impingement with [place].  Patient does have a positive crossover.  Mild positive O'Brien's noted as well.  No significant weakness though of the rotator cuff noted. Back -low back exam does have some mild loss of lordosis.  Tightness noted in the thoracolumbar juncture.  Patient does have mild tightness of the neck right greater than left.  Tightness in the right parascapular region.  Osteopathic findings  C6 flexed rotated and side bent left T9 extended rotated and side bent left L2 flexed rotated and side bent right Sacrum right on right       Assessment and Plan:  Right shoulder pain Patient is doing relatively well overall.  Patient has responded to injections in the past but patient would like to hold.  Still concern for potentially the [facility] arthropathy as well as a potential labral pathology.  Patient declined any type of advanced imaging with patient having days where he does not have any significant pain.  Continues to respond very well though to osteopathic manipulation.  Increase activity slowly.  Follow-up with me again 6    Nonallopathic problems  Decision today to treat with OMT was based on Physical Exam  After verbal consent patient was treated with HVLA, ME, FPR techniques in cervical, thoracic, lumbar, and sacral  areas  Patient tolerated the procedure well with improvement in symptoms  Patient given exercises, stretches and lifestyle modifications  See medications in patient instructions if given  Patient will follow up in 4-8 weeks      The above documentation has been reviewed and is accurate and complete Jim Pulley, DO       Note: This dictation was prepared  with Dragon dictation along with smaller phrase technology. Any transcriptional errors that result from this process are unintentional.

## 2020-10-15 ENCOUNTER — Other Ambulatory Visit: Payer: Self-pay

## 2020-10-15 ENCOUNTER — Ambulatory Visit: Payer: 59 | Admitting: Family Medicine

## 2020-10-15 ENCOUNTER — Encounter: Payer: Self-pay | Admitting: Family Medicine

## 2020-10-15 VITALS — BP 108/74 | HR 108 | Ht 69.0 in | Wt 145.8 lb

## 2020-10-15 DIAGNOSIS — M9984 Other biomechanical lesions of sacral region: Secondary | ICD-10-CM

## 2020-10-15 DIAGNOSIS — G8929 Other chronic pain: Secondary | ICD-10-CM

## 2020-10-15 DIAGNOSIS — M9981 Other biomechanical lesions of cervical region: Secondary | ICD-10-CM

## 2020-10-15 DIAGNOSIS — M9983 Other biomechanical lesions of lumbar region: Secondary | ICD-10-CM | POA: Diagnosis not present

## 2020-10-15 DIAGNOSIS — M9982 Other biomechanical lesions of thoracic region: Secondary | ICD-10-CM

## 2020-10-15 DIAGNOSIS — M999 Biomechanical lesion, unspecified: Secondary | ICD-10-CM

## 2020-10-15 DIAGNOSIS — M25511 Pain in right shoulder: Secondary | ICD-10-CM | POA: Diagnosis not present

## 2020-10-15 NOTE — Assessment & Plan Note (Signed)
Patient is doing relatively well overall.  Patient has responded to injections in the past but patient would like to hold.  Still concern for potentially the [facility] arthropathy as well as a potential labral pathology.  Patient declined any type of advanced imaging with patient having days where he does not have any significant pain.  Continues to respond very well though to osteopathic manipulation.  Increase activity slowly.  Follow-up with me again 6

## 2020-10-15 NOTE — Patient Instructions (Addendum)
Good to see you today! If pains worsens will get MRI See me in 6-8 weeks

## 2020-10-17 ENCOUNTER — Encounter: Payer: Self-pay | Admitting: Family Medicine

## 2020-10-18 ENCOUNTER — Other Ambulatory Visit (HOSPITAL_COMMUNITY): Payer: Self-pay

## 2020-10-18 MED ORDER — TIZANIDINE HCL 4 MG PO TABS
4.0000 mg | ORAL_TABLET | Freq: Every evening | ORAL | 2 refills | Status: AC
  Filled 2020-10-18: qty 30, 30d supply, fill #0

## 2020-10-19 NOTE — Progress Notes (Signed)
[place] Phone: [phone] Subjective:   [name], am serving as a scribe for Dr. Hulan Saas. This visit occurred during the SARS-CoV-2 public health emergency.  Safety protocols were in place, including screening questions prior to the visit, additional usage of staff PPE, and extensive cleaning of exam room while observing appropriate contact time as indicated for disinfecting solutions.   I'm seeing this patient by the request  of:  Burnard Hawthorne, FNP  CC: Low back pain  TGG:[id number]  Jim [name] is a 55 y.o. male coming in with complaint of low back pain. Patient seen 5 days ago for OMT. Patient having acute back pain since weekend. Taking zanaflex. Patient states that on Sunday morning his back became tight. Patient states that he is unable to walk due to pain. Using Tramadol and Zanaflex. Notices more tightness on left leg.  Has been constipated.  Patient denies any urinary incontinence.  Any pain with urination.   Patient is accompanied with daughter and mother.  They state that they have to help him with any type of transferring secondary to pain.  Past Medical History:  Diagnosis Date  . Diabetes mellitus 2012  . GERD (gastroesophageal reflux disease)    Past Surgical History:  Procedure Laterality Date  . CLOSED REDUCTION ANKLE FRACTURE    . COLONOSCOPY WITH PROPOFOL N/A 07/23/2020   Procedure: COLONOSCOPY WITH PROPOFOL;  Surgeon: Jonathon Bellows, MD;  Location: [facility] ENDOSCOPY;  Service: Gastroenterology;  Laterality: N/A;  . NO PAST SURGERIES     Social History   Socioeconomic History  . Marital status: Married    Spouse name: Not on file  . Number of children: Not on file  . Years of education: Not on file  . Highest education level: Not on file  Occupational History  . Not on file  Tobacco Use  . Smoking status: Never Smoker  . Smokeless tobacco: Never Used  Substance and  Sexual Activity  . Alcohol use: Yes    Comment: ocassionally  . Drug use: No  . Sexual activity: Not on file  Other Topics Concern  . Not on file  Social History Narrative   Works for [employer]   . Lives in [place]. 2 children, daughter 56YO and son 11YO   Social Determinants of Radio broadcast assistant Strain: Not on file  Food Insecurity: Not on file  Transportation Needs: Not on file  Physical Activity: Not on file  Stress: Not on file  Social Connections: Not on file   Allergies  Allergen Reactions  . Jardiance [Empagliflozin]     Fatigue, weight loss  . Ciprofloxacin Rash   Family History  Problem Relation Age of Onset  . Stroke Paternal Grandfather   . Diabetes Brother   . Parkinsonism Maternal Grandfather     Current Outpatient Medications (Endocrine & Metabolic):  Marland Kitchen  Dulaglutide 3 MG/0.5ML SOPN, INJECT 3MG  (0.5 MLS) ONCE A WEEK AS DIRECTED .  metFORMIN (GLUCOPHAGE-XR) 750 MG 24 hr tablet, TAKE 1 TABLET BY MOUTH 2 TIMES DAILY. * NEEDS APPT FOR FURTHER REFILLS .  glipiZIDE (GLUCOTROL) 5 MG tablet, TAKE 1 TABLET BY MOUTH TWO TIMES DAILY BEFORE A MEAL  Current Outpatient Medications (Cardiovascular):  .  sildenafil (VIAGRA) 100 MG tablet, Take 1 tablet (100 mg total) by mouth daily as needed for erectile dysfunction.   Current Outpatient Medications (Analgesics):  .  acetaminophen (TYLENOL) 500 MG tablet, Take  500 mg by mouth every 6 (six) hours as needed. Marland Kitchen  ibuprofen (ADVIL) 600 MG tablet, TAKE 1 TABLET BY MOUTH IN THE MORNING AND TAKE 1 TABLET BY MOUTH AT BEDTIME   Current Outpatient Medications (Other):  Marland Kitchen  Glucose Blood (BLOOD GLUCOSE TEST STRIPS) STRP, True Result test strips, to test blood glucose 3 times daily, use as directed .  Insulin Pen Needle 32G X 4 MM MISC, Use 1x a day .  neomycin-polymyxin b-dexamethasone (MAXITROL) 3.5-10000-0.1 OINT, APPLY TO AFFECTED EYELID FOUR TIMES A DAY .  tiZANidine (ZANAFLEX) 4 MG tablet, Take 1 tablet (4  mg total) by mouth Nightly for 10 days. .  TRUEPLUS LANCETS 30G MISC, USE AS DIRECTED   Reviewed prior external information including notes and imaging from  primary care provider As well as notes that were available from care everywhere and other healthcare systems.  Past medical history, social, surgical and family history all reviewed in electronic medical record.  No pertanent information unless stated regarding to the chief complaint.   Review of Systems:  No headache, visual changes, nausea, vomiting, diarrhea,  dizziness, abdominal pain, skin rash, fevers, chills, night sweats, weight loss, swollen lymph nodes, body aches, joint swelling, chest pain, shortness of breath, mood changes. POSITIVE muscle aches has been significantly constipated  Objective  Blood pressure 118/78, pulse (!) 106, height 5\' 9"  (1.753 m), weight 145 lb (65.8 kg), SpO2 97 %.   General: No apparent distress alert and oriented x3 mood and affect normal, dressed appropriately.  HEENT: Pupils equal, extraocular movements intact  Respiratory: Patient's speak in full sentences and does not appear short of breath  Cardiovascular: No lower extremity edema, non tender, no erythema  Gait patient is unable to stand up straight secondary to pain.  Patient does seem to have weakness in the legs. MSK:   Patient's back is tender.  Patient will has positive straight leg with radicular symptoms of 10 degrees bilaterally.  Mild weakness with dorsiflexion bilaterally.  3+ DTRs of the Achilles bilaterally.  Somewhat concerning but no significant beats of clonus.  Neurovascularly intact.    Impression and Recommendations:     The above documentation has been reviewed and is accurate and complete Lyndal Pulley, DO

## 2020-10-20 ENCOUNTER — Encounter: Payer: Self-pay | Admitting: Family Medicine

## 2020-10-20 ENCOUNTER — Emergency Department (HOSPITAL_COMMUNITY): Payer: 59

## 2020-10-20 ENCOUNTER — Emergency Department (HOSPITAL_COMMUNITY)
Admission: EM | Admit: 2020-10-20 | Discharge: 2020-10-21 | Disposition: A | Discharge status: Home / Self Care | Payer: 59 | Attending: Emergency Medicine | Admitting: Emergency Medicine

## 2020-10-20 ENCOUNTER — Encounter (HOSPITAL_COMMUNITY): Payer: Self-pay | Admitting: Family Medicine

## 2020-10-20 ENCOUNTER — Ambulatory Visit: Payer: 59 | Admitting: Family Medicine

## 2020-10-20 ENCOUNTER — Encounter (HOSPITAL_COMMUNITY): Payer: Self-pay

## 2020-10-20 ENCOUNTER — Other Ambulatory Visit: Payer: Self-pay

## 2020-10-20 DIAGNOSIS — Z7984 Long term (current) use of oral hypoglycemic drugs: Secondary | ICD-10-CM | POA: Insufficient documentation

## 2020-10-20 DIAGNOSIS — M461 Sacroiliitis, not elsewhere classified: Secondary | ICD-10-CM | POA: Diagnosis not present

## 2020-10-20 DIAGNOSIS — M545 Low back pain, unspecified: Secondary | ICD-10-CM | POA: Diagnosis not present

## 2020-10-20 DIAGNOSIS — M5126 Other intervertebral disc displacement, lumbar region: Secondary | ICD-10-CM | POA: Insufficient documentation

## 2020-10-20 DIAGNOSIS — E785 Hyperlipidemia, unspecified: Secondary | ICD-10-CM | POA: Diagnosis not present

## 2020-10-20 DIAGNOSIS — Z794 Long term (current) use of insulin: Secondary | ICD-10-CM | POA: Insufficient documentation

## 2020-10-20 DIAGNOSIS — M5416 Radiculopathy, lumbar region: Secondary | ICD-10-CM

## 2020-10-20 DIAGNOSIS — E1169 Type 2 diabetes mellitus with other specified complication: Secondary | ICD-10-CM | POA: Diagnosis not present

## 2020-10-20 DIAGNOSIS — R531 Weakness: Secondary | ICD-10-CM | POA: Diagnosis not present

## 2020-10-20 DIAGNOSIS — Z20822 Contact with and (suspected) exposure to covid-19: Secondary | ICD-10-CM | POA: Diagnosis not present

## 2020-10-20 DIAGNOSIS — R29898 Other symptoms and signs involving the musculoskeletal system: Secondary | ICD-10-CM

## 2020-10-20 DIAGNOSIS — M4807 Spinal stenosis, lumbosacral region: Secondary | ICD-10-CM | POA: Diagnosis not present

## 2020-10-20 LAB — COMPREHENSIVE METABOLIC PANEL
ALT: 37 U/L (ref 0–44)
AST: 23 U/L (ref 15–41)
Albumin: 3.8 g/dL (ref 3.5–5.0)
Alkaline Phosphatase: 74 U/L (ref 38–126)
Anion gap: 10 (ref 5–15)
BUN: 20 mg/dL (ref 6–20)
CO2: 26 mmol/L (ref 22–32)
Calcium: 9.6 mg/dL (ref 8.9–10.3)
Chloride: 100 mmol/L (ref 98–111)
Creatinine, Ser: 0.86 mg/dL (ref 0.61–1.24)
GFR, Estimated: >60 mL/min (ref >60)
Glucose, Bld: 150 mg/dL — ABNORMAL HIGH (ref 70–99)
Potassium: 3.8 mmol/L (ref 3.5–5.1)
Sodium: 136 mmol/L (ref 135–145)
Total Bilirubin: 0.5 mg/dL (ref 0.3–1.2)
Total Protein: 6.2 g/dL — ABNORMAL LOW (ref 6.5–8.1)

## 2020-10-20 LAB — RESP PANEL BY RT-PCR (FLU A&B, COVID) ARPGX2
Influenza A by PCR: NEGATIVE
Influenza B by PCR: NEGATIVE
SARS Coronavirus 2 by RT PCR: NEGATIVE

## 2020-10-20 LAB — CBC WITH DIFFERENTIAL/PLATELET
Abs Immature Granulocytes: 0.02 10*3/uL (ref 0.00–0.07)
Basophils Absolute: 0.1 10*3/uL (ref 0.0–0.1)
Basophils Relative: 1 %
Eosinophils Absolute: 0.3 10*3/uL (ref 0.0–0.5)
Eosinophils Relative: 4 %
HCT: 45.4 % (ref 39.0–52.0)
Hemoglobin: 15.7 g/dL (ref 13.0–17.0)
Immature Granulocytes: 0 %
Lymphocytes Relative: 23 %
Lymphs Abs: 1.9 10*3/uL (ref 0.7–4.0)
MCH: 29.7 pg (ref 26.0–34.0)
MCHC: 34.6 g/dL (ref 30.0–36.0)
MCV: 85.8 fL (ref 80.0–100.0)
Monocytes Absolute: 1 10*3/uL (ref 0.1–1.0)
Monocytes Relative: 12 %
Neutro Abs: 5 10*3/uL (ref 1.7–7.7)
Neutrophils Relative %: 60 %
Platelets: 371 10*3/uL (ref 150–400)
RBC: 5.29 MIL/uL (ref 4.22–5.81)
RDW: 12.5 % (ref 11.5–15.5)
WBC: 8.3 10*3/uL (ref 4.0–10.5)
nRBC: 0 % (ref 0.0–0.2)

## 2020-10-20 MED ORDER — PREDNISONE 10 MG PO TABS
ORAL_TABLET | ORAL | 0 refills | Status: DC
  Filled 2020-10-20: qty 15, 5d supply, fill #0

## 2020-10-20 MED ORDER — PREDNISONE 20 MG PO TABS
60.0000 mg | ORAL_TABLET | Freq: Once | ORAL | Status: AC
  Administered 2020-10-21: 60 mg via ORAL
  Filled 2020-10-20: qty 3

## 2020-10-20 MED ORDER — MORPHINE SULFATE (PF) 2 MG/ML IV SOLN
2.0000 mg | Freq: Once | INTRAVENOUS | Status: AC
Start: 2020-10-20 — End: 2020-10-20
  Administered 2020-10-20: 2 mg via INTRAVENOUS
  Filled 2020-10-20: qty 1

## 2020-10-20 MED ORDER — ONDANSETRON HCL 4 MG/2ML IJ SOLN
4.0000 mg | Freq: Once | INTRAMUSCULAR | Status: AC
  Administered 2020-10-20: 4 mg via INTRAVENOUS
  Filled 2020-10-20: qty 2

## 2020-10-20 NOTE — Assessment & Plan Note (Signed)
Worsening lumbar radiculopathy.  Patient actually does have signs that is concerning for potential correlate clinically patient's deep tendon reflexes are symmetrical.  Patient though does not have the weakness of the feet with severe pain with even 10 degrees of flexion of the back with radicular symptoms.  Patient is unable to stand secondary to weakness.  Send patient to the emergency room for further evaluation.  Likely will need MRI and potentially further evaluation by neurosurgery.

## 2020-10-20 NOTE — ED Triage Notes (Signed)
Emergency Medicine Provider Triage Evaluation Note  Jim [name] , a 55 y.o. male  was evaluated in triage.  Pt complains of low back pain, radiates to legs, seen at OMT clinic with Dr. Tamala Julian, sent to the ER today due to worsening pain since Sunday, no relief with zanaflex, concern for herniated disc.  Review of Systems  Positive: Leg pain, weakness Negative: Saddle paresthesia, loss of bowel or bladder control, abdominal pain  Physical Exam  BP 113/71 (BP Location: Left Arm)   Pulse (!) 111   Temp 98.5 F (36.9 C) (Oral)   Resp 18   SpO2 100%  Gen:   Awake, no distress   HEENT:  Atraumatic  Resp:  Normal effort Cardiac:  Normal rate Abd:   Nondistended, nontender  MSK:   Moves extremities without difficulty, no TTP low back, no bony tenderness Neuro:  Speech clear, sensation intact  Medical Decision Making  Medically screening exam initiated at 9:25 AM.  Appropriate orders placed.  Jim [name] was informed that the remainder of the evaluation will be completed by another provider, this initial triage assessment does not replace that evaluation, and the importance of remaining in the ED until their evaluation is complete.  Clinical Impression  Low back pain, MRI ordered after eval and review of note from Dr. Tamala Julian today.   Jim Learn, PA-C 10/20/20 [phone]

## 2020-10-20 NOTE — Discharge Instructions (Addendum)
The Neurosurgeon believes that you have sacroiliitis and, which is inflammation in the joint between your tailbone and your hip bones.  This can be treated with steroids.  We have given you a dose of prednisone before you leave.  You will continue a taper of this over the next 5 days.  Make sure that you watch your blood sugars closely and give your primary care doctor a call tomorrow to schedule an appointment within the next few days.  Monitor your CBGs regularly.  Continue to stay well-hydrated.  Take prednisone with food to avoid hurting her stomach.  Call Dr. Colleen Can office to schedule an appointment for follow-up in 2 weeks.  If your pain is not improving, see him sooner.  Come back to the ED if you have worsening of your symptoms, you have numbness and tingling in your groin, you are unable to urinate, you are stooling on yourself.

## 2020-10-20 NOTE — Patient Instructions (Signed)
Good to see you Go to [facility] ER  Concern for herniated disc causing weakness of lower extremities and possible cauda equinine  Hold on anything at this moment I will follow along you can call or mychart me with updates

## 2020-10-20 NOTE — ED Provider Notes (Signed)
[facility] EMERGENCY DEPARTMENT Provider Note   CSN: [id number] Arrival date & time: 10/20/20  0856     History Chief Complaint  Patient presents with  . Back Pain    Jim [name] is a 55 y.o. male.  Patient is a 55 year old male with a history of type 2 diabetes, Last A1c 9.2 on 07/03/2021 and GERD.  He presents today from PCP office after worsening low back pain that started 3 days ago.  Reports that 4 days ago, he was doing excessive yard work and thinks that this exacerbated his symptoms.  He states that his pain has been getting progressively worse over the last 3 days.  States that this morning, he was unable to walk secondary to pain.  He feels that his lower legs are very weak.  Denies any numbness and tingling.  States that the pain is in the middle of his back and he has difficulty getting comfortable.  He was seen by his PCP today, who recommended ED referral for MRI.  He denies any changes in bowel or bladder habits, saddle anesthesias, fevers, IV drug use.        Past Medical History:  Diagnosis Date  . Diabetes mellitus 2012  . GERD (gastroesophageal reflux disease)     Patient Active Problem List   Diagnosis Date Noted  . Hordeolum externum (stye) 05/18/2020  . Right shoulder pain 05/14/2020  . Contact dermatitis 03/31/2020  . AC joint pain 03/09/2020  . Adjustment disorder with mixed anxiety and depressed mood 03/09/2020  . Right rib fracture 02/13/2020  . Weight loss 12/31/2017  . History of vitamin D deficiency 12/31/2017  . Insect bite 12/31/2017  . Bronchitis, acute 09/29/2017  . Stress fracture of metatarsal bone of right foot 05/11/2017  . Nonallopathic lesion of cervical region 01/08/2017  . Nonallopathic lesion of lumbosacral region 05/24/2016  . Nonallopathic lesion of thoracic region 05/24/2016  . Lumbar radiculopathy 04/13/2016  . Routine physical examination 10/25/2015  . Erectile dysfunction 04/24/2013  . Type 2  diabetes mellitus with hyperglycemia, without long-term current use of insulin ([place]) 02/16/2012  . Hyperlipidemia 11/16/2011    Past Surgical History:  Procedure Laterality Date  . CLOSED REDUCTION ANKLE FRACTURE    . COLONOSCOPY WITH PROPOFOL N/A 07/23/2020   Procedure: COLONOSCOPY WITH PROPOFOL;  Surgeon: Jonathon Bellows, MD;  Location: [facility] ENDOSCOPY;  Service: Gastroenterology;  Laterality: N/A;  . NO PAST SURGERIES         Family History  Problem Relation Age of Onset  . Stroke Paternal Grandfather   . Diabetes Brother   . Parkinsonism Maternal Grandfather     Social History   Tobacco Use  . Smoking status: Never Smoker  . Smokeless tobacco: Never Used  Substance Use Topics  . Alcohol use: Yes    Comment: ocassionally  . Drug use: No    Home Medications Prior to Admission medications   Medication Sig Start Date End Date Taking? Authorizing Provider  predniSONE (DELTASONE) 10 MG tablet Take 5 tablets on day 1, then 4 tablets, then 3 tablets, then 2 tablets, then 1 tablet. 10/20/20  Yes Azaliah Carrero, Bernita Raisin, DO  acetaminophen (TYLENOL) 500 MG tablet Take 500 mg by mouth every 6 (six) hours as needed.    [provider]  Dulaglutide 3 MG/0.5ML SOPN INJECT 3MG  (0.5 MLS) ONCE A WEEK AS DIRECTED 08/18/20 08/18/21  Burnard Hawthorne, FNP  glipiZIDE (GLUCOTROL) 5 MG tablet TAKE 1 TABLET BY MOUTH TWO TIMES DAILY BEFORE  A MEAL 09/16/19 09/15/20  Philemon Kingdom, MD  Glucose Blood (BLOOD GLUCOSE TEST STRIPS) STRP True Result test strips, to test blood glucose 3 times daily, use as directed 11/22/16   Thersa Salt G, DO  ibuprofen (ADVIL) 600 MG tablet TAKE 1 TABLET BY MOUTH IN THE MORNING AND TAKE 1 TABLET BY MOUTH AT BEDTIME 05/14/20 05/14/21  Lyndal Pulley, DO  Insulin Pen Needle 32G X 4 MM MISC Use 1x a day 06/16/19   Philemon Kingdom, MD  metFORMIN (GLUCOPHAGE-XR) 750 MG 24 hr tablet TAKE 1 TABLET BY MOUTH 2 TIMES DAILY. * NEEDS APPT FOR FURTHER REFILLS 08/24/20 08/24/21  Burnard Hawthorne, FNP  neomycin-polymyxin b-dexamethasone (MAXITROL) 3.5-10000-0.1 OINT APPLY TO AFFECTED EYELID FOUR TIMES A DAY 06/02/20 06/02/21  Agapito Games  sildenafil (VIAGRA) 100 MG tablet Take 1 tablet (100 mg total) by mouth daily as needed for erectile dysfunction. 01/26/16   Jackolyn Confer, MD  tiZANidine (ZANAFLEX) 4 MG tablet Take 1 tablet (4 mg total) by mouth Nightly for 10 days. 10/18/20 11/18/20  Lyndal Pulley, DO  TRUEPLUS LANCETS 30G MISC USE AS DIRECTED 11/22/16   Coral Spikes, DO    Allergies    Jardiance [empagliflozin] and Ciprofloxacin  Review of Systems   Review of Systems  Constitutional: Negative for fatigue and fever.  HENT: Negative for congestion.   Eyes: Negative for visual disturbance.  Respiratory: Negative for cough and shortness of breath.   Cardiovascular: Negative for chest pain and leg swelling.  Gastrointestinal: Negative for abdominal pain, constipation, diarrhea, nausea and vomiting.  Genitourinary: Negative for difficulty urinating, dysuria and hematuria.  Musculoskeletal: Positive for back pain.       B/l leg weakness  Skin: Negative for rash.  Neurological: Positive for weakness. Negative for numbness.    Physical Exam Updated Vital Signs BP 118/84   Pulse 97   Temp 98.4 F (36.9 C) (Oral)   Resp 17   SpO2 99%   Physical Exam Constitutional:      General: He is not in acute distress. HENT:     Head: Normocephalic and atraumatic.  Eyes:     Extraocular Movements: Extraocular movements intact.     Pupils: Pupils are equal, round, and reactive to light.  Cardiovascular:     Rate and Rhythm: Normal rate and regular rhythm.     Comments: 2+ dorsalis pedis pulses b/l Pulmonary:     Effort: Pulmonary effort is normal.     Breath sounds: Normal breath sounds.  Abdominal:     General: Abdomen is flat. Bowel sounds are normal.     Palpations: Abdomen is soft.  Musculoskeletal:        General: No swelling or tenderness.      Cervical back: Normal range of motion.       Back:     Comments: Pain with moving in bed  Skin:    General: Skin is warm and dry.     Capillary Refill: Capillary refill takes less than 2 seconds.  Neurological:     Mental Status: He is alert.     Sensory: No sensory deficit (sensation intact BUE/BLE).     Motor: Weakness (4/5 strength BLE, 5/5 strength BUE) present.     Deep Tendon Reflexes: Reflexes normal (1+ DTR patellar and achilles b/l).     Comments: CN II-XII grossly intact  Psychiatric:        Mood and Affect: Mood normal.        Behavior: Behavior  normal.     ED Results / Procedures / Treatments   Labs (all labs ordered are listed, but only abnormal results are displayed) Labs Reviewed  COMPREHENSIVE METABOLIC PANEL - Abnormal; Notable for the following components:      Result Value   Glucose, Bld 150 (*)    Total Protein 6.2 (*)    All other components within normal limits  RESP PANEL BY RT-PCR (FLU A&B, COVID) ARPGX2  CBC WITH DIFFERENTIAL/PLATELET    EKG None  Radiology MR LUMBAR SPINE WO CONTRAST  Result Date: 10/20/2020 CLINICAL DATA:  Low back pain. EXAM: MRI LUMBAR SPINE WITHOUT CONTRAST TECHNIQUE: Multiplanar, multisequence MR imaging of the lumbar spine was performed. No intravenous contrast was administered. COMPARISON:  Lumbar radiographs April 13, 2016. FINDINGS: Segmentation:  Transitional lumbosacral anatomy with lumbarized S1. Alignment:  Normal. Vertebrae: Vertebral body heights are maintained. No specific evidence of acute fracture, discitis/osteomyelitis, or suspicious bone lesion. Conus medullaris and cauda equina: Conus extends to the L2. level. Conus and cauda equina appear normal. Paraspinal and other soft tissues: Unremarkable. Disc levels: T12-L1: Only imaged sagittally without evidence of significant canal or foraminal stenosis. L1-L2: Only imaged sagittally without evidence of significant canal or foraminal stenosis. L2-L3: No significant  disc protrusion, foraminal stenosis, or canal stenosis. L3-L4: Mild facet hypertrophy without significant canal or foraminal stenosis. L4-L5: Mild facet hypertrophy without significant canal or foraminal stenosis. L5-S1: Disc desiccation. Broad disc bulge with superimposed inferiorly dissecting central disc protrusion. Disc contact bilateral descending S1 nerve roots with mild right greater than left subarticular recess narrowing. No significant central canal stenosis. Mild bilateral foraminal stenosis. S1-S2: No significant canal or foraminal stenosis. IMPRESSION: 1. Transitional lumbosacral anatomy with lumbarized S1. 2. At L5-S1 there is a an inferiorly dissecting central disc protrusion which contacts bilateral descending S1 nerve roots with mild right greater than left subarticular recess narrowing. Mild bilateral foraminal stenosis at this level. Electronically Signed   By: Margaretha Sheffield MD   On: 10/20/2020 12:40    Procedures Procedures   Medications Ordered in ED Medications  predniSONE (DELTASONE) tablet 60 mg (has no administration in time range)  morphine 2 MG/ML injection 2 mg (2 mg Intravenous Given 10/20/20 1933)  ondansetron (ZOFRAN) injection 4 mg (4 mg Intravenous Given 10/20/20 1932)    ED Course  I have reviewed the triage vital signs and the nursing notes.  Pertinent labs & imaging results that were available during my care of the patient were reviewed by me and considered in my medical decision making (see chart for details).    MDM Rules/Calculators/A&P                          Patient is a 55 year old male with history of type 2 diabetes and GERD, who presents with worsening low back pain and BLE weakness.  No recent fevers.  No saddle anesthesia.  No urinary retention or change in bowel habits.  No signs of cauda equina.  Sensation remains intact and DTRs symmetric 1+ patellar and achilles b/l.  MRI on presentation shows L5-S1 superiorly dissecting central disc  protrusion which contacts bilateral descending S1 nerve roots with mild right greater than left subarticular recess narrowing.  There is also mild bilateral foraminal stenosis at this level.  Spoke with neurosurgery on-call, Dr. Zada Finders, who reports that weakness is likely not caused by findings on MRI.  Neurosurgery will evaluate patient at bedside.  Will also obtain CMP and CBC.  Wife  now at bedside and states that he has been weak for 3 days and that they have been completely supporting him due to his weakness.  2000 patient seen at bedside.  Resting comfortably.  Reports improvement in pain with morphine.  CMP, CBC, COVID and Flu WNL.  Spoke with Dr. Zada Finders, who states that patient's symptoms c/w sacroiliitis.  Recommends steroids and f/u in 2 weeks or sooner as needed with Dr. Zada Finders as outpatient.  Will give Prednisone 60mg  x1 while here, will send taper.  Patient advised to monitor for hyperglycemia and f/u with PCP in 1-2 days to adjust DM meds as needed.  Vitals are stable, okay to discharge with close outpatient f/u.  They decline wheelchair.  Final Clinical Impression(s) / ED Diagnoses Final diagnoses:  Protrusion of lumbar intervertebral disc  Leg weakness, bilateral  Sacroiliitis ([place])    Rx / DC Orders ED Discharge Orders         Ordered    predniSONE (DELTASONE) 10 MG tablet        10/20/20 2244           Harla Mensch, Bernita Raisin, DO 10/20/20 2249    Margette Fast, MD 10/21/20 0007

## 2020-10-20 NOTE — Consult Note (Signed)
Neurosurgery Consultation  Reason for Consult: Low back pain Referring Physician: Long  CC: low back pain  HPI: This is a 55 y.o. man w/ a h/o acute on chronic LBP. The pain is sharp in quality, severe in intensity, worse witih movement, improved with rest. It is located in the sacral region, to the right of midline more than left. It has become severe in the past few days and bad enough to bring him to the ED. No radiation into the leg, he feels like his legs are weak when he walks but he's also doubled over in pain. No new numbness, no parasthesias, no change in bowel or bladder function. No recent use of anti-platelet or anti-coagulant medications.   ROS: A 14 point ROS was performed and is negative except as noted in the HPI.   PMHx:  Past Medical History:  Diagnosis Date  . Diabetes mellitus 2012  . GERD (gastroesophageal reflux disease)    FamHx:  Family History  Problem Relation Age of Onset  . Stroke Paternal Grandfather   . Diabetes Brother   . Parkinsonism Maternal Grandfather    SocHx:  reports that he has never smoked. He has never used smokeless tobacco. He reports current alcohol use. He reports that he does not use drugs.  Exam: Vital signs in last 24 hours: Temp:  [98.4 F (36.9 C)-98.8 F (37.1 C)] 98.4 F (36.9 C) (04/06 1408) Pulse Rate:  [93-111] 97 (04/06 2215) Resp:  [12-29] 17 (04/06 2215) BP: (107-128)/(71-90) 118/84 (04/06 2215) SpO2:  [97 %-100 %] 99 % (04/06 2215) Weight:  [65.8 kg] 65.8 kg (04/06 0757) General: Awake, alert, cooperative, lying in bed in NAD Head: Normocephalic and atruamatic HEENT: Neck supple Pulmonary: breathing room air comfortably, no evidence of increased work of breathing Cardiac: RRR Abdomen: S NT ND Extremities: Warm and well perfused x4 Neuro: AOx3, PERRL, EOMI, FS Strength 5/5 x4, SILTx4, reflexes diffusely 1+ +TTP in sacral region, R>L, +Gaenslen's on R>L, +sacral thrust, +Fortin finger sign, negative  FABER   Assessment and Plan: 55 y.o. man w/ acute on chronic LBP. MRI personally reviewed, which shows mild lateral recess stenosis at L5-S1. Clinical symptoms and exam are consistent with sacroiliitis, inconsistent with radiculopathy and less consistent with either lumbar or hip pathology.   -recommend steroids and anti-inflammatories, should help with symptoms -follow up with me in 2 weeks as an outpatient  -please call with any concerns or questions  Judith Part, MD 10/20/20 10:39 PM [facility]

## 2020-10-20 NOTE — ED Triage Notes (Signed)
Pt is here today sent by PCP due to lower back pain. Pt reports the pain got worse today but started Sunday.Pt reports today he unable to walk. Pt reports the back has never been this bad.Pt reports he unable to get up on his own.

## 2020-10-21 ENCOUNTER — Encounter: Payer: Self-pay | Admitting: Family

## 2020-10-21 ENCOUNTER — Other Ambulatory Visit (HOSPITAL_COMMUNITY): Payer: Self-pay

## 2020-10-21 DIAGNOSIS — Z7984 Long term (current) use of oral hypoglycemic drugs: Secondary | ICD-10-CM | POA: Diagnosis not present

## 2020-10-21 DIAGNOSIS — M461 Sacroiliitis, not elsewhere classified: Secondary | ICD-10-CM | POA: Diagnosis not present

## 2020-10-21 DIAGNOSIS — E1169 Type 2 diabetes mellitus with other specified complication: Secondary | ICD-10-CM | POA: Diagnosis not present

## 2020-10-21 DIAGNOSIS — Z794 Long term (current) use of insulin: Secondary | ICD-10-CM | POA: Diagnosis not present

## 2020-10-21 DIAGNOSIS — M5126 Other intervertebral disc displacement, lumbar region: Secondary | ICD-10-CM | POA: Diagnosis not present

## 2020-10-21 DIAGNOSIS — Z20822 Contact with and (suspected) exposure to covid-19: Secondary | ICD-10-CM | POA: Diagnosis not present

## 2020-10-21 DIAGNOSIS — E785 Hyperlipidemia, unspecified: Secondary | ICD-10-CM | POA: Diagnosis not present

## 2020-10-21 NOTE — ED Notes (Signed)
E-signature pad unavailable at time of pt discharge. This RN discussed discharge materials with pt and answered all pt questions. Pt stated understanding of discharge material. ? ?

## 2020-10-22 ENCOUNTER — Other Ambulatory Visit: Payer: 59

## 2020-10-30 ENCOUNTER — Other Ambulatory Visit: Payer: Self-pay | Admitting: Family

## 2020-11-01 ENCOUNTER — Other Ambulatory Visit (HOSPITAL_COMMUNITY): Payer: Self-pay

## 2020-11-01 MED ORDER — METFORMIN HCL ER 750 MG PO TB24
ORAL_TABLET | ORAL | 1 refills | Status: DC
  Filled 2020-11-01: qty 60, 30d supply, fill #0

## 2020-11-16 ENCOUNTER — Encounter: Payer: Self-pay | Admitting: Family

## 2020-11-16 ENCOUNTER — Telehealth (INDEPENDENT_AMBULATORY_CARE_PROVIDER_SITE_OTHER): Payer: 59 | Admitting: Family

## 2020-11-16 ENCOUNTER — Telehealth: Payer: 59 | Admitting: Family

## 2020-11-16 ENCOUNTER — Other Ambulatory Visit (HOSPITAL_COMMUNITY): Payer: Self-pay

## 2020-11-16 VITALS — Ht 69.0 in

## 2020-11-16 DIAGNOSIS — E1165 Type 2 diabetes mellitus with hyperglycemia: Secondary | ICD-10-CM | POA: Diagnosis not present

## 2020-11-16 DIAGNOSIS — M5416 Radiculopathy, lumbar region: Secondary | ICD-10-CM | POA: Diagnosis not present

## 2020-11-16 DIAGNOSIS — E785 Hyperlipidemia, unspecified: Secondary | ICD-10-CM | POA: Diagnosis not present

## 2020-11-16 MED ORDER — DULAGLUTIDE 3 MG/0.5ML ~~LOC~~ SOAJ
SUBCUTANEOUS | 3 refills | Status: DC
  Filled 2020-11-16: qty 6, 84d supply, fill #0
  Filled 2021-03-18: qty 2, 28d supply, fill #1

## 2020-11-16 MED ORDER — METFORMIN HCL ER 500 MG PO TB24
1000.0000 mg | ORAL_TABLET | Freq: Two times a day (BID) | ORAL | 3 refills | Status: DC
  Filled 2020-11-16: qty 120, 30d supply, fill #0
  Filled 2021-01-12: qty 120, 30d supply, fill #1
  Filled 2021-02-16: qty 120, 30d supply, fill #2
  Filled 2021-03-23: qty 120, 30d supply, fill #3

## 2020-11-16 NOTE — Assessment & Plan Note (Addendum)
Declines statin medication. Advised to reconsider due to elevated cardiovascular risk. We will continue to discuss.

## 2020-11-16 NOTE — Progress Notes (Signed)
Virtual Visit via Video Note  I connected with@  on 11/17/20 at 12:00 PM EDT by a video enabled telemedicine application and verified that I am speaking with the correct person using two identifiers.  Location patient: home Location provider:work  Persons participating in the virtual visit: patient, provider  I discussed the limitations of evaluation and management by telemedicine and the availability of in person appointments. The patient expressed understanding and agreed to proceed.   HPI: Low back pain and lower extremity weakness has improved after prednisone taper.  He continues to notice mild low back pain when sits for long periods of time.   No saddle anesthesia, numbness, trouble having a bowel movement or urinating.   No falls.   DM- FBG 150. Compliant with trulicity 3mg , glipizide 5mg  BID metformin XR 750mg  BID.  He thinks he has continued to loose weight.   Seen in ED 10/20/20 diagnosed with sacroiliitis, disc protrusion.  ED physician consulted with Dr Zada Finders whom reviewed MRI lumbar per note who felt symptoms c/w sacroiliitis and recommended steroid taper.F/u in 2 weeks.    MRI lumbar 10/21/19 Transitional lumbosacral anatomy with lumbarized S1.  At L5-S1 there is a an inferiorly dissecting central disc protrusion which contacts bilateral descending S1 nerve roots with mild right greater than left subarticular recess narrowing. Mild bilateral foraminal stenosis at this level  ROS: See pertinent positives and negatives per HPI.    EXAM:  VITALS per patient if applicable: Ht 5\' 9"  (1.753 m)   BMI 21.41 kg/m  BP Readings from Last 3 Encounters:  10/21/20 93/61  10/20/20 118/78  10/15/20 108/74   Wt Readings from Last 3 Encounters:  10/20/20 145 lb (65.8 kg)  10/15/20 145 lb 12.8 oz (66.1 kg)  08/20/20 149 lb (67.6 kg)    GENERAL: alert, oriented, appears well and in no acute distress  HEENT: atraumatic, conjunttiva clear, no obvious abnormalities on  inspection of external nose and ears  NECK: normal movements of the head and neck  LUNGS: on inspection no signs of respiratory distress, breathing rate appears normal, no obvious gross SOB, gasping or wheezing  CV: no obvious cyanosis  MS: moves all visible extremities without noticeable abnormality  PSYCH/NEURO: pleasant and cooperative, no obvious depression or anxiety, speech and thought processing grossly intact  ASSESSMENT AND PLAN:  Discussed the following assessment and plan:  Problem List Items Addressed This Visit      Endocrine   Type 2 diabetes mellitus with hyperglycemia, without long-term current use of insulin (HCC) - Primary    Uncontrolled, improved since prednisone taper. Pending a1c.Advised to maximize metformin to 2000mg  /day, continue glipizide 5mg  bid, trulicity 3mg . Will follow weight to ensure remains stable and plan to avoid further titration of trulicity.       Relevant Medications   Dulaglutide 3 MG/0.5ML SOPN   metFORMIN (GLUCOPHAGE XR) 500 MG 24 hr tablet   Other Relevant Orders   Hemoglobin A1c   Comprehensive metabolic panel   Lipid panel     Nervous and Auditory   Lumbar radiculopathy    Chronic, stable improved after prednisone. Reviewed ED visit notes and neurosurgery consult with Dr Zada Finders. Patient declines follow with neurosurgery as note advised and prefers to discuss the need for follow up with Dr Tamala Julian. Will follow.         Other   Hyperlipidemia    Declines statin medication. Advised to reconsider due to elevated cardiovascular risk. We will continue to discuss.         -  we discussed possible serious and likely etiologies, options for evaluation and workup, limitations of telemedicine visit vs in person visit, treatment, treatment risks and precautions. Pt prefers to treat via telemedicine empirically rather then risking or undertaking an in person visit at this moment.  .   I discussed the assessment and treatment plan with  the patient. The patient was provided an opportunity to ask questions and all were answered. The patient agreed with the plan and demonstrated an understanding of the instructions.   The patient was advised to call back or seek an in-person evaluation if the symptoms worsen or if the condition fails to improve as anticipated.   Mable Paris, FNP

## 2020-11-17 NOTE — Assessment & Plan Note (Signed)
Uncontrolled, improved since prednisone taper. Pending a1c.Advised to maximize metformin to 2000mg  /day, continue glipizide 5mg  bid, trulicity 3mg . Will follow weight to ensure remains stable and plan to avoid further titration of trulicity.

## 2020-11-17 NOTE — Assessment & Plan Note (Signed)
Chronic, stable improved after prednisone. Reviewed ED visit notes and neurosurgery consult with Dr Zada Finders. Patient declines follow with neurosurgery as note advised and prefers to discuss the need for follow up with Dr Tamala Julian. Will follow.

## 2020-11-18 ENCOUNTER — Other Ambulatory Visit: Payer: Self-pay | Admitting: Internal Medicine

## 2020-11-19 ENCOUNTER — Other Ambulatory Visit (HOSPITAL_COMMUNITY): Payer: Self-pay

## 2020-11-19 ENCOUNTER — Other Ambulatory Visit: Payer: Self-pay | Admitting: Family

## 2020-11-19 MED ORDER — GLIPIZIDE 5 MG PO TABS
ORAL_TABLET | ORAL | 3 refills | Status: DC
  Filled 2020-11-19: qty 180, 90d supply, fill #0
  Filled 2021-02-16: qty 180, 90d supply, fill #1
  Filled 2021-06-14: qty 180, 90d supply, fill #2

## 2020-12-23 NOTE — Progress Notes (Signed)
[place] Phone: [phone] Subjective:   [name], am serving as a scribe for Dr. Hulan Saas.  This visit occurred during the SARS-CoV-2 public health emergency.  Safety protocols were in place, including screening questions prior to the visit, additional usage of staff PPE, and extensive cleaning of exam room while observing appropriate contact time as indicated for disinfecting solutions.    I'm seeing this patient by the request  of:  Burnard Hawthorne, FNP  CC: Low back pain follow-up  [id number]  Jim [name] is a 55 y.o. male coming in with complaint of back and neck pain. OMT 10/20/2020. Patient states that he is much improved since last visit. Tries to be cautious with his movements.   Medications patient has been prescribed: None  Taking:    Lumbar MRI 10/20/2020 IMPRESSION: 1. Transitional lumbosacral anatomy with lumbarized S1. 2. At L5-S1 there is a an inferiorly dissecting central disc protrusion which contacts bilateral descending S1 nerve roots with mild right greater than left subarticular recess narrowing. Mild bilateral foraminal stenosis at this level.     Reviewed prior external information including notes and imaging from previsou exam, outside providers and external EMR if available.   As well as notes that were available from care everywhere and other healthcare systems.  Past medical history, social, surgical and family history all reviewed in electronic medical record.  No pertanent information unless stated regarding to the chief complaint.   Past Medical History:  Diagnosis Date   Diabetes mellitus 2012   GERD (gastroesophageal reflux disease)     Allergies  Allergen Reactions   Jardiance [Empagliflozin]     Fatigue, weight loss   Ciprofloxacin Rash     Review of Systems:  No headache, visual changes, nausea, vomiting, diarrhea, constipation,  dizziness, abdominal pain, skin rash, fevers, chills, night sweats, weight loss, swollen lymph nodes, body aches, joint swelling, chest pain, shortness of breath, mood changes. POSITIVE muscle aches  Objective  Blood pressure 108/80, pulse (!) 105, height 5\' 9"  (1.753 m), weight 144 lb (65.3 kg), SpO2 98 %.   General: No apparent distress alert and oriented x3 mood and affect normal, dressed appropriately.  HEENT: Pupils equal, extraocular movements intact  Respiratory: Patient's speak in full sentences and does not appear short of breath  Cardiovascular: No lower extremity edema, non tender, no erythema  Neuro: Cranial nerves II through XII are intact, neurovascularly intact in all extremities with 2+ DTRs and 2+ pulses.  Gait normal with good balance and coordination.  MSK:  Non tender with full range of motion and good stability and symmetric strength and tone of shoulders, elbows, wrist, hip, knee and ankles bilaterally.  Back low back exam does have some mild tightness noted more in the thoracolumbar juncture than truly the lumbosacral area today.  Patient has tightness with straight leg test but no true radiculopathy.  Negative FABER test.  Neurovascular intact distally.  Osteopathic findings   C6 flexed rotated and side bent left T3 extended rotated and side bent right inhaled rib T8 extended rotated and side bent left L2 flexed rotated and side bent right Sacrum right on right       Assessment and Plan:  Lumbar radiculopathy Patient fortunately is doing significantly better at this time.  Patient did have the MRI of the lumbar spine when he was in the emergency room that did show an L5 bilateral nerve root impingement that was  consistent with patient's symptoms.  Did respond well to the prednisone.  Restarted osteopathic manipulation today.  Encourage patient to continue to work on core strengthening.  Follow-up with me again 6 weeks   Nonallopathic problems  Decision today  to treat with OMT was based on Physical Exam  After verbal consent patient was treated with HVLA, ME, echniques in cervical, rib, thoracic, lumbar, and sacral  areas  Patient tolerated the procedure well with improvement in symptoms  Patient given exercises, stretches and lifestyle modifications  See medications in patient instructions if given  Patient will follow up in 4-8 weeks      The above documentation has been reviewed and is accurate and complete Lyndal Pulley, DO       Note: This dictation was prepared with Dragon dictation along with smaller phrase technology. Any transcriptional errors that result from this process are unintentional.

## 2020-12-24 ENCOUNTER — Other Ambulatory Visit: Payer: Self-pay

## 2020-12-24 ENCOUNTER — Ambulatory Visit: Payer: 59 | Admitting: Family Medicine

## 2020-12-24 ENCOUNTER — Encounter: Payer: Self-pay | Admitting: Family Medicine

## 2020-12-24 VITALS — BP 108/80 | HR 105 | Ht 69.0 in | Wt 144.0 lb

## 2020-12-24 DIAGNOSIS — M9901 Segmental and somatic dysfunction of cervical region: Secondary | ICD-10-CM | POA: Diagnosis not present

## 2020-12-24 DIAGNOSIS — M9904 Segmental and somatic dysfunction of sacral region: Secondary | ICD-10-CM | POA: Diagnosis not present

## 2020-12-24 DIAGNOSIS — M9902 Segmental and somatic dysfunction of thoracic region: Secondary | ICD-10-CM | POA: Diagnosis not present

## 2020-12-24 DIAGNOSIS — M5416 Radiculopathy, lumbar region: Secondary | ICD-10-CM

## 2020-12-24 DIAGNOSIS — M9903 Segmental and somatic dysfunction of lumbar region: Secondary | ICD-10-CM

## 2020-12-24 NOTE — Patient Instructions (Signed)
Glad you are doing better See me again in 6 weeks

## 2020-12-24 NOTE — Assessment & Plan Note (Signed)
Patient fortunately is doing significantly better at this time.  Patient did have the MRI of the lumbar spine when he was in the emergency room that did show an L5 bilateral nerve root impingement that was consistent with patient's symptoms.  Did respond well to the prednisone.  Restarted osteopathic manipulation today.  Encourage patient to continue to work on core strengthening.  Follow-up with me again 6 weeks

## 2021-01-12 ENCOUNTER — Other Ambulatory Visit (HOSPITAL_COMMUNITY): Payer: Self-pay

## 2021-01-26 NOTE — Progress Notes (Signed)
[name] [place] Phone: [phone] Subjective:   Jim [name], am serving as a scribe for Dr. Hulan Saas.  I'm seeing this patient by the request  of:  Burnard Hawthorne, FNP  CC: Back pain follow-up, foot pain  [id number]  Jim [name] is a 55 y.o. male coming in with complaint of back and neck pain.OMT 12/24/2020. Patient states that he is ready for some "help" with his back. Patient complains of Right foot pain locates mid ball of foot. Has had this issue for years but most recent flare up a couple months ago. Had an MRI from the ED from that episode and now it is tight and sore and would like for it to be looked at. Patient states that his dog had got up under his feet and he kind of slid on the Right ball of foot and then another time missed/slipped on one step of a ladder hitting that same spot again. Initially things were fine but now is noticing some pain.           Past Medical History:  Diagnosis Date   Diabetes mellitus 2012   GERD (gastroesophageal reflux disease)     Allergies  Allergen Reactions   Jardiance [Empagliflozin]     Fatigue, weight loss   Ciprofloxacin Rash     Review of Systems:  No headache, visual changes, nausea, vomiting, diarrhea, constipation, dizziness, abdominal pain, skin rash, fevers, chills, night sweats, weight loss, swollen lymph nodes, body aches, joint swelling, chest pain, shortness of breath, mood changes. POSITIVE muscle aches  Objective  Blood pressure 124/80, pulse 99, height 5\' 9"  (1.753 m), weight 143 lb (64.9 kg), SpO2 97 %.   General: No apparent distress alert and oriented x3 mood and affect normal, dressed appropriately.  HEENT: Pupils equal, extraocular movements intact  Respiratory: Patient's speak in full sentences and does not appear short of breath  Cardiovascular: No lower extremity edema, non tender, no erythema  Low back exam does  have some mild loss of lordosis.  Some tenderness to palpation in the paraspinal musculature.  Tightness around the right sacroiliac joint.  Negative straight leg test but still has tightness of the hamstrings bilaterally. Neck exam mild loss of lordosis.  Tightness noted in the parascapular region on the right side.  Negative Spurling's.  Osteopathic findings  C2 flexed rotated and side bent right T3 extended rotated and side bent right inhaled rib T9 extended rotated and side bent left L3 flexed rotated and side bent right Sacrum right on right   Limited muscular skeletal ultrasound was performed and interpreted by Hulan Saas, M  Limited ultrasound of patient's foot shows the patient may have some mild bursitis noted of the plantar aspect of the foot.  Lumbar radiculopathy Likely no radicular symptoms but patient does have chronic tightness noted of the back.  Mild exacerbation.  Not taking any medications for it other than ibuprofen from time to time.  Responds well to manipulation.  Follow-up again in 6 weeks  Stress fracture of metatarsal bone of right foot Patient has had difficulty with this. 4.  Seems to be more of a bursitis than a true stress reaction.  Ultrasound did not show any significant findings today.  Had difficulty though saving the pictures.  Possible bursitis noted.  Discussed over-the-counter orthotics, icing regimen, topical anti-inflammatories.  Follow-up with me again in 6 weeks.  Worsening pain will need to consider imaging.  Assessment and Plan:  Lumbar radiculopathy Likely no radicular symptoms but patient does have chronic tightness noted of the back.  Mild exacerbation.  Not taking any medications for it other than ibuprofen from time to time.  Responds well to manipulation.  Follow-up again in 6 weeks  Stress fracture of metatarsal bone of right foot Patient has had difficulty with this. 4.  Seems to be more of a bursitis than a true stress  reaction.  Ultrasound did not show any significant findings today.  Had difficulty though saving the pictures.  Possible bursitis noted.  Discussed over-the-counter orthotics, icing regimen, topical anti-inflammatories.  Follow-up with me again in 6 weeks.  Worsening pain will need to consider imaging.   Nonallopathic problems  Decision today to treat with OMT was based on Physical Exam  After verbal consent patient was treated with HVLA, ME, FPR techniques in cervical, rib, thoracic, lumbar, and sacral  areas  Patient tolerated the procedure well with improvement in symptoms  Patient given exercises, stretches and lifestyle modifications  See medications in patient instructions if given  Patient will follow up in 4-8 weeks     The above documentation has been reviewed and is accurate and complete Lyndal Pulley, DO        Note: This dictation was prepared with Dragon dictation along with smaller phrase technology. Any transcriptional errors that result from this process are unintentional.

## 2021-02-04 ENCOUNTER — Encounter: Payer: Self-pay | Admitting: Family Medicine

## 2021-02-04 ENCOUNTER — Other Ambulatory Visit: Payer: Self-pay

## 2021-02-04 ENCOUNTER — Ambulatory Visit: Payer: 59 | Admitting: Family Medicine

## 2021-02-04 ENCOUNTER — Ambulatory Visit: Payer: Self-pay

## 2021-02-04 VITALS — BP 124/80 | HR 99 | Ht 69.0 in | Wt 143.0 lb

## 2021-02-04 DIAGNOSIS — M9902 Segmental and somatic dysfunction of thoracic region: Secondary | ICD-10-CM

## 2021-02-04 DIAGNOSIS — M9901 Segmental and somatic dysfunction of cervical region: Secondary | ICD-10-CM

## 2021-02-04 DIAGNOSIS — M84374A Stress fracture, right foot, initial encounter for fracture: Secondary | ICD-10-CM | POA: Diagnosis not present

## 2021-02-04 DIAGNOSIS — M9908 Segmental and somatic dysfunction of rib cage: Secondary | ICD-10-CM

## 2021-02-04 DIAGNOSIS — M79672 Pain in left foot: Secondary | ICD-10-CM

## 2021-02-04 DIAGNOSIS — M9904 Segmental and somatic dysfunction of sacral region: Secondary | ICD-10-CM

## 2021-02-04 DIAGNOSIS — M5416 Radiculopathy, lumbar region: Secondary | ICD-10-CM | POA: Diagnosis not present

## 2021-02-04 DIAGNOSIS — M9903 Segmental and somatic dysfunction of lumbar region: Secondary | ICD-10-CM

## 2021-02-04 NOTE — Assessment & Plan Note (Signed)
Likely no radicular symptoms but patient does have chronic tightness noted of the back.  Mild exacerbation.  Not taking any medications for it other than ibuprofen from time to time.  Responds well to manipulation.  Follow-up again in 6 weeks

## 2021-02-04 NOTE — Patient Instructions (Addendum)
Good to see you  [place]aid to the toes Spenco orthotics Don't lace the last eye on your shoe See me again in 6 weeks

## 2021-02-04 NOTE — Assessment & Plan Note (Signed)
Patient has had difficulty with this. 4.  Seems to be more of a bursitis than a true stress reaction.  Ultrasound did not show any significant findings today.  Had difficulty though saving the pictures.  Possible bursitis noted.  Discussed over-the-counter orthotics, icing regimen, topical anti-inflammatories.  Follow-up with me again in 6 weeks.  Worsening pain will need to consider imaging.

## 2021-02-16 ENCOUNTER — Other Ambulatory Visit (HOSPITAL_COMMUNITY): Payer: Self-pay

## 2021-03-17 NOTE — Progress Notes (Signed)
[facility] [place] Phone: [phone] Subjective:   [name], am serving as a scribe for Dr. Hulan Saas. This visit occurred during the SARS-CoV-2 public health emergency.  Safety protocols were in place, including screening questions prior to the visit, additional usage of staff PPE, and extensive cleaning of exam room while observing appropriate contact time as indicated for disinfecting solutions.   I'm seeing this patient by the request  of:  Burnard Hawthorne, FNP  CC: Neck and back pain follow-up  [id number]  02/04/2021 Patient has had difficulty with this. 4.  Seems to be more of a bursitis than a true stress reaction.  Ultrasound did not show any significant findings today.  Had difficulty though saving the pictures.  Possible bursitis noted.  Discussed over-the-counter orthotics, icing regimen, topical anti-inflammatories.  Follow-up with me again in 6 weeks.  Worsening pain will need to consider imaging.  Update 03/18/2021 Jim [name] is a 55 y.o. male coming in with complaint of back and neck pain. OMT 02/04/2021. LRfoot pain f/u. Patient states that he has been having lower L lumbar spine pain.   Medications patient has been prescribed: None          Reviewed prior external information including notes and imaging from previsou exam, outside providers and external EMR if available.   As well as notes that were available from care everywhere and other healthcare systems.  Past medical history, social, surgical and family history all reviewed in electronic medical record.  No pertanent information unless stated regarding to the chief complaint.   Past Medical History:  Diagnosis Date   Diabetes mellitus 2012   GERD (gastroesophageal reflux disease)     Allergies  Allergen Reactions   Jardiance [Empagliflozin]     Fatigue, weight loss   Ciprofloxacin Rash     Review of Systems:  No  headache, visual changes, nausea, vomiting, diarrhea, constipation, dizziness, abdominal pain, skin rash, fevers, chills, night sweats, weight loss, swollen lymph nodes, body aches, joint swelling, chest pain, shortness of breath, mood changes. POSITIVE muscle aches  Objective  Blood pressure 110/72, pulse (!) 107, height '5\' 9"'$  (1.753 m), weight 142 lb (64.4 kg), SpO2 98 %.   General: No apparent distress alert and oriented x3 mood and affect normal, dressed appropriately.  HEENT: Pupils equal, extraocular movements intact  Respiratory: Patient's speak in full sentences and does not appear short of breath  Cardiovascular: No lower extremity edema, non tender, no erythema  Patient is back exam does have some loss lordosis.  Tightness noted in the parascapular region on the right side but both lower back does have tightness more on the left side.  Mild tightness noted on the hip flexor on the left side.  Osteopathic findings  C3 flexed rotated and side bent right C6 flexed rotated and side bent left T3 extended rotated and side bent right inhaled rib L1 flexed rotated and side bent left Sacrum right on right      Assessment and Plan:  Lumbar radiculopathy Chronic problem with the patient is having some left-sided neck tightness.  Likely no significant radicular symptoms at the moment though.  Patient did have some significant amount of tightness and we did more muscle energy than usual today.  Discussed medication but patient is doing relatively well.  Struggling with a great deal and is going to be going to [place] in October will make sure.  As well as have  some different medications.  Follow-up with me again in 6 to 8 weeks   Nonallopathic problems  Decision today to treat with OMT was based on Physical Exam  After verbal consent patient was treated with HVLA, ME, FPR techniques in cervical, rib, thoracic, lumbar, and sacral  areas  Patient tolerated the procedure well with  improvement in symptoms  Patient given exercises, stretches and lifestyle modifications  See medications in patient instructions if given  Patient will follow up in 4-8 weeks     The above documentation has been reviewed and is accurate and complete Lyndal Pulley, DO        Note: This dictation was prepared with Dragon dictation along with smaller phrase technology. Any transcriptional errors that result from this process are unintentional.

## 2021-03-18 ENCOUNTER — Other Ambulatory Visit (HOSPITAL_COMMUNITY): Payer: Self-pay

## 2021-03-18 ENCOUNTER — Encounter: Payer: Self-pay | Admitting: Family Medicine

## 2021-03-18 ENCOUNTER — Other Ambulatory Visit: Payer: Self-pay

## 2021-03-18 ENCOUNTER — Ambulatory Visit: Payer: 59 | Admitting: Family Medicine

## 2021-03-18 VITALS — BP 110/72 | HR 107 | Ht 69.0 in | Wt 142.0 lb

## 2021-03-18 DIAGNOSIS — M9901 Segmental and somatic dysfunction of cervical region: Secondary | ICD-10-CM | POA: Diagnosis not present

## 2021-03-18 DIAGNOSIS — M9908 Segmental and somatic dysfunction of rib cage: Secondary | ICD-10-CM

## 2021-03-18 DIAGNOSIS — M9903 Segmental and somatic dysfunction of lumbar region: Secondary | ICD-10-CM

## 2021-03-18 DIAGNOSIS — M9902 Segmental and somatic dysfunction of thoracic region: Secondary | ICD-10-CM

## 2021-03-18 DIAGNOSIS — M9904 Segmental and somatic dysfunction of sacral region: Secondary | ICD-10-CM

## 2021-03-18 DIAGNOSIS — M5416 Radiculopathy, lumbar region: Secondary | ICD-10-CM

## 2021-03-18 NOTE — Assessment & Plan Note (Signed)
Chronic problem with the patient is having some left-sided neck tightness.  Likely no significant radicular symptoms at the moment though.  Patient did have some significant amount of tightness and we did more muscle energy than usual today.  Discussed medication but patient is doing relatively well.  Struggling with a great deal and is going to be going to [place] in October will make sure.  As well as have some different medications.  Follow-up with me again in 6 to 8 weeks

## 2021-03-18 NOTE — Patient Instructions (Signed)
See me in 6-8 weeks 

## 2021-03-24 ENCOUNTER — Other Ambulatory Visit (HOSPITAL_COMMUNITY): Payer: Self-pay

## 2021-04-27 ENCOUNTER — Other Ambulatory Visit: Payer: Self-pay | Admitting: Family

## 2021-04-27 ENCOUNTER — Other Ambulatory Visit (HOSPITAL_COMMUNITY): Payer: Self-pay

## 2021-04-27 DIAGNOSIS — E1165 Type 2 diabetes mellitus with hyperglycemia: Secondary | ICD-10-CM

## 2021-04-27 MED ORDER — METFORMIN HCL ER 500 MG PO TB24
1000.0000 mg | ORAL_TABLET | Freq: Two times a day (BID) | ORAL | 3 refills | Status: DC
  Filled 2021-04-27: qty 120, 30d supply, fill #0
  Filled 2021-05-30: qty 120, 30d supply, fill #1
  Filled 2021-07-05: qty 120, 30d supply, fill #2
  Filled 2021-08-02: qty 120, 30d supply, fill #3

## 2021-04-27 NOTE — Progress Notes (Signed)
Jim [name] [address] [place] Phone: [phone] Subjective:   Jim [name], am serving as a scribe for Dr. Hulan Saas. This visit occurred during the SARS-CoV-2 public health emergency.  Safety protocols were in place, including screening questions prior to the visit, additional usage of staff PPE, and extensive cleaning of exam room while observing appropriate contact time as indicated for disinfecting solutions.   I'm seeing this patient by the request  of:  Burnard Hawthorne, FNP  CC: Low back pain follow-up  [id number]  Jim [name] is a 56 y.o. male coming in with complaint of back and neck pain. OMT on 03/18/2021. Patient states everything is feeling better. No new complaints.  Patient though would like to potential manipulation with him leaving for [place] for about a week long duration.  Patient will be doing a lot of sitting and that seems to be something that can exacerbate his neck.  As well as his lower back.  Medications patient has been prescribed: None  Taking:         Reviewed prior external information including notes and imaging from previsou exam, outside providers and external EMR if available.   As well as notes that were available from care everywhere and other healthcare systems.  Past medical history, social, surgical and family history all reviewed in electronic medical record.  No pertanent information unless stated regarding to the chief complaint.   Past Medical History:  Diagnosis Date   Diabetes mellitus 2012   GERD (gastroesophageal reflux disease)     Allergies  Allergen Reactions   Jardiance [Empagliflozin]     Fatigue, weight loss   Ciprofloxacin Rash     Review of Systems:  No headache, visual changes, nausea, vomiting, diarrhea, constipation, dizziness, abdominal pain, skin rash, fevers, chills, night sweats, weight loss, swollen lymph nodes, body aches, joint swelling,  chest pain, shortness of breath, mood changes. POSITIVE muscle aches but intermittently  Objective  Blood pressure 106/72, pulse (!) 108, height 5\' 9"  (1.753 m), weight 141 lb (64 kg), SpO2 97 %.   General: No apparent distress alert and oriented x3 mood and affect normal, dressed appropriately.  HEENT: Pupils equal, extraocular movements intact  Respiratory: Patient's speak in full sentences and does not appear short of breath  Cardiovascular: No lower extremity edema, non tender, no erythema  Low back exam does have some mild loss of lordosis.  Tightness with FABER test on the right.  Negative straight leg test.  Neck exam does have some mild loss of lordosis as well.  Tightness noted with sidebending bilaterally.  Osteopathic findings  C2 flexed rotated and side bent right C7 flexed rotated and side bent left T5 extended rotated and side bent right inhaled rib L2 flexed rotated and side bent right Sacrum right on right       Assessment and Plan:  No problem-specific Assessment & Plan notes found for this encounter.   Nonallopathic problems  Decision today to treat with OMT was based on Physical Exam  After verbal consent patient was treated with HVLA, ME, FPR techniques in cervical, rib, thoracic, lumbar, and sacral  areas  Patient tolerated the procedure well with improvement in symptoms  Patient given exercises, stretches and lifestyle modifications  See medications in patient instructions if given  Patient will follow up in 4-8 weeks      The above documentation has been reviewed and is accurate and complete Lyndal Pulley, DO  Note: This dictation was prepared with Dragon dictation along with smaller phrase technology. Any transcriptional errors that result from this process are unintentional.

## 2021-04-28 ENCOUNTER — Other Ambulatory Visit (HOSPITAL_COMMUNITY): Payer: Self-pay

## 2021-04-28 ENCOUNTER — Other Ambulatory Visit: Payer: Self-pay

## 2021-04-28 ENCOUNTER — Other Ambulatory Visit: Payer: Self-pay | Admitting: Family

## 2021-04-28 ENCOUNTER — Ambulatory Visit: Payer: 59 | Admitting: Family Medicine

## 2021-04-28 VITALS — BP 106/72 | HR 108 | Ht 69.0 in | Wt 141.0 lb

## 2021-04-28 DIAGNOSIS — M9904 Segmental and somatic dysfunction of sacral region: Secondary | ICD-10-CM

## 2021-04-28 DIAGNOSIS — M5416 Radiculopathy, lumbar region: Secondary | ICD-10-CM | POA: Diagnosis not present

## 2021-04-28 DIAGNOSIS — E1165 Type 2 diabetes mellitus with hyperglycemia: Secondary | ICD-10-CM

## 2021-04-28 DIAGNOSIS — M9902 Segmental and somatic dysfunction of thoracic region: Secondary | ICD-10-CM | POA: Diagnosis not present

## 2021-04-28 DIAGNOSIS — M9903 Segmental and somatic dysfunction of lumbar region: Secondary | ICD-10-CM | POA: Diagnosis not present

## 2021-04-28 DIAGNOSIS — M9908 Segmental and somatic dysfunction of rib cage: Secondary | ICD-10-CM

## 2021-04-28 DIAGNOSIS — M9901 Segmental and somatic dysfunction of cervical region: Secondary | ICD-10-CM

## 2021-04-28 NOTE — Patient Instructions (Signed)
Good to see you  Ice is your friend  Have a great time Have an appointment in 4 weeks just in case but if doing well can move it to 8 weeks\

## 2021-04-29 ENCOUNTER — Encounter: Payer: Self-pay | Admitting: Family Medicine

## 2021-04-29 NOTE — Assessment & Plan Note (Signed)
Patient likely is not having any radicular symptoms.  Shoulder pain has almost completely resolved at this point.  Patient has good range of motion of multiple joints.  Patient still will monitor his weight.  Continues to seem to lose.  Patient's last A1c was quite elevated.  Discussed that patient should follow-up with primary care provider as well to see if anything else needs to be changed.  Patient is due for an A1c and if he has not had it next time we see him I would encourage laboratory work-up.  Follow-up with me again in 4 to 8 weeks

## 2021-05-02 ENCOUNTER — Other Ambulatory Visit (HOSPITAL_COMMUNITY): Payer: Self-pay

## 2021-05-02 NOTE — Telephone Encounter (Signed)
Call patient He is very overdue for A1c and follow-up.  Please schedule.  Please confirm what dose of Trulicity is he is on.  Per my notes, he was on Trulicity 3 mg [place] once per week, please refill once confirmed

## 2021-05-03 DIAGNOSIS — H43399 Other vitreous opacities, unspecified eye: Secondary | ICD-10-CM | POA: Diagnosis not present

## 2021-05-03 DIAGNOSIS — H5203 Hypermetropia, bilateral: Secondary | ICD-10-CM | POA: Diagnosis not present

## 2021-05-05 ENCOUNTER — Other Ambulatory Visit (HOSPITAL_COMMUNITY): Payer: Self-pay

## 2021-05-25 ENCOUNTER — Encounter: Payer: Self-pay | Admitting: Family

## 2021-05-25 ENCOUNTER — Other Ambulatory Visit: Payer: Self-pay | Admitting: Family

## 2021-05-25 DIAGNOSIS — Z125 Encounter for screening for malignant neoplasm of prostate: Secondary | ICD-10-CM

## 2021-05-25 DIAGNOSIS — E785 Hyperlipidemia, unspecified: Secondary | ICD-10-CM

## 2021-05-25 DIAGNOSIS — E1165 Type 2 diabetes mellitus with hyperglycemia: Secondary | ICD-10-CM

## 2021-05-27 NOTE — Telephone Encounter (Signed)
Mychart message sent to patient.

## 2021-05-30 ENCOUNTER — Other Ambulatory Visit (HOSPITAL_COMMUNITY): Payer: Self-pay

## 2021-06-03 ENCOUNTER — Ambulatory Visit: Payer: 59 | Admitting: Family Medicine

## 2021-06-14 ENCOUNTER — Other Ambulatory Visit (HOSPITAL_COMMUNITY): Payer: Self-pay

## 2021-06-27 ENCOUNTER — Other Ambulatory Visit (INDEPENDENT_AMBULATORY_CARE_PROVIDER_SITE_OTHER): Payer: 59

## 2021-06-27 ENCOUNTER — Other Ambulatory Visit: Payer: Self-pay

## 2021-06-27 DIAGNOSIS — Z125 Encounter for screening for malignant neoplasm of prostate: Secondary | ICD-10-CM | POA: Diagnosis not present

## 2021-06-27 DIAGNOSIS — E1165 Type 2 diabetes mellitus with hyperglycemia: Secondary | ICD-10-CM

## 2021-06-27 DIAGNOSIS — E785 Hyperlipidemia, unspecified: Secondary | ICD-10-CM | POA: Diagnosis not present

## 2021-06-27 LAB — COMPREHENSIVE METABOLIC PANEL
ALT: 19 U/L (ref 0–53)
AST: 15 U/L (ref 0–37)
Albumin: 4.1 g/dL (ref 3.5–5.2)
Alkaline Phosphatase: 78 U/L (ref 39–117)
BUN: 18 mg/dL (ref 6–23)
CO2: 24 mEq/L (ref 19–32)
Calcium: 9.4 mg/dL (ref 8.4–10.5)
Chloride: 102 mEq/L (ref 96–112)
Creatinine, Ser: 0.87 mg/dL (ref 0.40–1.50)
GFR: 98.48 mL/min (ref >60.00)
Glucose, Bld: 210 mg/dL — ABNORMAL HIGH (ref 70–99)
Potassium: 4.3 mEq/L (ref 3.5–5.1)
Sodium: 138 mEq/L (ref 135–145)
Total Bilirubin: 0.4 mg/dL (ref 0.2–1.2)
Total Protein: 6.1 g/dL (ref 6.0–8.3)

## 2021-06-27 LAB — PSA: PSA: 0.68 ng/mL (ref 0.10–4.00)

## 2021-06-27 LAB — HEMOGLOBIN A1C: Hgb A1c MFr Bld: 10.1 % — ABNORMAL HIGH (ref 4.6–6.5)

## 2021-06-29 NOTE — Addendum Note (Signed)
Addended by: Elpidio Galea T on: 06/29/2021 10:28 AM   Modules accepted: Orders

## 2021-06-30 ENCOUNTER — Telehealth: Payer: Self-pay

## 2021-06-30 NOTE — Chronic Care Management (AMB) (Signed)
°  Care Management   Note  06/30/2021 Name: Jim [name] MRN: [id number] DOB: Feb 18, 1966  Jim [name] is a 55 y.o. year old male who is a primary care patient of Burnard Hawthorne, FNP. I reached out to Jim [name] by phone today in response to a referral sent by Mr. Jim Lundin Baggett's primary care provider.   Mr. Dougher was given information about care management services today including:  Care management services include personalized support from designated clinical staff supervised by his physician, including individualized plan of care and coordination with other care providers 24/7 contact phone numbers for assistance for urgent and routine care needs. The patient may stop care management services at any time by phone call to the office staff.  Patient agreed to services and verbal consent obtained.   Follow up plan: Telephone appointment with care management team member scheduled for:07/14/2021   Noreene Larsson, [place], [place], [place] [address] Direct Dial: [phone] [email or web address]

## 2021-07-05 ENCOUNTER — Other Ambulatory Visit (HOSPITAL_COMMUNITY): Payer: Self-pay

## 2021-07-06 NOTE — Progress Notes (Signed)
[place] Phone: [phone] Subjective:   [name], am serving as a scribe for Dr. Hulan Saas.  This visit occurred during the SARS-CoV-2 public health emergency.  Safety protocols were in place, including screening questions prior to the visit, additional usage of staff PPE, and extensive cleaning of exam room while observing appropriate contact time as indicated for disinfecting solutions.   I'm seeing this patient by the request  of:  Burnard Hawthorne, FNP  CC: Back and neck pain follow-up  [id number]  Jim [name] is a 55 y.o. male coming in with complaint of back and neck pain. OMT 03/18/2021. Patient states that he has been doing ok. Some days are better than others. Feels tight this morning.  Patient overall seems to be doing a little bit better.  Medications patient has been prescribed: None  Taking:         Reviewed prior external information including notes and imaging from previsou exam, outside providers and external EMR if available.   As well as notes that were available from care everywhere and other healthcare systems.  Past medical history, social, surgical and family history all reviewed in electronic medical record.  No pertanent information unless stated regarding to the chief complaint.   Past Medical History:  Diagnosis Date   Diabetes mellitus 2012   GERD (gastroesophageal reflux disease)     Allergies  Allergen Reactions   Jardiance [Empagliflozin]     Fatigue, weight loss   Ciprofloxacin Rash     Review of Systems:  No headache, visual changes, nausea, vomiting, diarrhea, constipation, dizziness, abdominal pain, skin rash, fevers, chills, night sweats, weight loss, swollen lymph nodes, body aches, joint swelling, chest pain, shortness of breath, mood changes. POSITIVE muscle aches  Objective  Blood pressure (!) 142/82, pulse (!) 108, height 5\' 9"  (1.753  m), weight 146 lb (66.2 kg), SpO2 96 %.   General: No apparent distress alert and oriented x3 mood and affect normal, dressed appropriately.  HEENT: Pupils equal, extraocular movements intact  Respiratory: Patient's speak in full sentences and does not appear short of breath  Cardiovascular: No lower extremity edema, non tender, no erythema  Neuro: Cranial nerves II through XII are intact, neurovascularly intact in all extremities with 2+ DTRs and 2+ pulses.  Gait normal with good balance and coordination.  MSK:  Non tender with full range of motion and good stability and symmetric strength and tone of shoulders, elbows, wrist, hip, knee and ankles bilaterally.  Back - Normal skin, Spine with normal alignment and no deformity.  No tenderness to vertebral process palpation.  Paraspinous muscles are not tender and without spasm.   Range of motion is full at neck and lumbar sacral regions  Osteopathic findings  C2 flexed rotated and side bent right C5 flexed rotated and side bent left T3 extended rotated and side bent right inhaled rib T8 extended rotated and side bent left L2 flexed rotated and side bent right keep it rested Sacrum right on right       Assessment and Plan:  Lumbar radiculopathy Stable at the moment.  Responded extremely well to osteopathic manipulation.  Discussed icing regimen and home exercises, discussed importance of follow-up again in 6 to 8 weeks.   Nonallopathic problems  Decision today to treat with OMT was based on Physical Exam  After verbal consent patient was treated with HVLA, ME, FPR techniques in cervical, rib, thoracic, lumbar, and  sacral  areas  Patient tolerated the procedure well with improvement in symptoms  Patient given exercises, stretches and lifestyle modifications  See medications in patient instructions if given  Patient will follow up in 8-12 weeks     The above documentation has been reviewed and is accurate and complete Lyndal Pulley, DO        Note: This dictation was prepared with Dragon dictation along with smaller phrase technology. Any transcriptional errors that result from this process are unintentional.

## 2021-07-07 ENCOUNTER — Ambulatory Visit: Payer: 59 | Admitting: Family Medicine

## 2021-07-07 ENCOUNTER — Other Ambulatory Visit: Payer: Self-pay

## 2021-07-07 VITALS — BP 142/82 | HR 108 | Ht 69.0 in | Wt 146.0 lb

## 2021-07-07 DIAGNOSIS — M9908 Segmental and somatic dysfunction of rib cage: Secondary | ICD-10-CM

## 2021-07-07 DIAGNOSIS — M9904 Segmental and somatic dysfunction of sacral region: Secondary | ICD-10-CM | POA: Diagnosis not present

## 2021-07-07 DIAGNOSIS — M9903 Segmental and somatic dysfunction of lumbar region: Secondary | ICD-10-CM

## 2021-07-07 DIAGNOSIS — M9901 Segmental and somatic dysfunction of cervical region: Secondary | ICD-10-CM

## 2021-07-07 DIAGNOSIS — M9902 Segmental and somatic dysfunction of thoracic region: Secondary | ICD-10-CM | POA: Diagnosis not present

## 2021-07-07 DIAGNOSIS — M5416 Radiculopathy, lumbar region: Secondary | ICD-10-CM | POA: Diagnosis not present

## 2021-07-07 NOTE — Assessment & Plan Note (Signed)
Stable at the moment.  Responded extremely well to osteopathic manipulation.  Discussed icing regimen and home exercises, discussed importance of follow-up again in 6 to 8 weeks.

## 2021-07-07 NOTE — Patient Instructions (Signed)
Great to see you Happy Holidays See me in 2-3 months

## 2021-07-14 ENCOUNTER — Ambulatory Visit: Payer: 59 | Admitting: Pharmacist

## 2021-07-14 DIAGNOSIS — E1165 Type 2 diabetes mellitus with hyperglycemia: Secondary | ICD-10-CM

## 2021-07-14 NOTE — Progress Notes (Signed)
° °   ° °  Chief Complaint  Patient presents with   Care Coordination    Follow Up    Jim [name] is a 55 y.o. year old male who was referred for medication management by their primary care provider, Vidal Schwalbe, Yvetta Coder, FNP. They presented for a telephone visit in the context of the COVID-19 pandemic.    Subjective: Diabetes:  Current medications: metformin 1,000 mg by mouth twice daily and glipizide IR 5 mg by mouth twice daily with meals  Medications tried in the past:  - Jardiance - weight loss, dehydration - Trulicity 3 mg - reports GI upset with this, less symptoms with lower dose - Lantus 10 units - stopped due to abdominal discomfort and improvement in A1c  Current glucose readings: not checking regularly, reports a post prandial ~440   Previously seen by endocrinology. In 2019, C peptide was checked, was low normal, GADA <5, ZNT8 <15, islet cell antibodies negative.     Objective: Lab Results  Component Value Date   HGBA1C 10.1 (H) 06/27/2021    Lab Results  Component Value Date   CREATININE 0.87 06/27/2021   BUN 18 06/27/2021   NA 138 06/27/2021   K 4.3 06/27/2021   CL 102 06/27/2021   CO2 24 06/27/2021    Lab Results  Component Value Date   CHOL 152 09/03/2020   HDL 37.50 (L) 09/03/2020   LDLCALC 76 09/03/2020   LDLDIRECT 90.0 04/30/2015   TRIG 193.0 (H) 09/03/2020   CHOLHDL 4 09/03/2020     Assessment/Plan:   Diabetes: - Currently uncontrolled related to stopping Trulicity.  - Discussed options of restarting Trulicity and capping at 1.5 mg weekly vs adding Mounjaro vs adding basal insulin. Patient elects to start [facility]. PCP follow up next week, will prepare sample for him to start. Will collaborate w/ PCP to send script next week,  - Discussed use of Libre 3 CGM. Patient interested. Will prepare sample for him to start next week. Will collaborate w/ PCP to send script next week.  - Recommend to continue metformin 1000 mg twice daily and  glipizide 5 mg twice daily for now, goal of elimination of glipizide moving forward given impact on beta cell function.     Follow Up Plan: Video visit in 4 weeks  Catie Darnelle Maffucci, PharmD, [place], CPP Clinical Pharmacist [employer] [facility] [employer] [phone]

## 2021-07-14 NOTE — Patient Instructions (Signed)
Jim [name],   Continue metformin 1000 mg twice daily and glipizide 5 mg twice daily.   When you see Joycelyn Schmid next week, let's plan to start Mounjaro 2.5 mg weekly and start using Libre 3 Continuous Glucose Monitor. I will have samples of both for you.   Take care!  Catie Darnelle Maffucci, PharmD

## 2021-07-19 ENCOUNTER — Encounter: Payer: Self-pay | Admitting: Family

## 2021-07-19 ENCOUNTER — Telehealth: Payer: Self-pay | Admitting: Pharmacist

## 2021-07-19 ENCOUNTER — Other Ambulatory Visit: Payer: Self-pay

## 2021-07-19 ENCOUNTER — Ambulatory Visit: Payer: 59 | Admitting: Family

## 2021-07-19 VITALS — BP 118/72 | HR 97 | Temp 99.0°F | Ht 69.0 in | Wt 143.4 lb

## 2021-07-19 DIAGNOSIS — Z23 Encounter for immunization: Secondary | ICD-10-CM | POA: Diagnosis not present

## 2021-07-19 DIAGNOSIS — R0981 Nasal congestion: Secondary | ICD-10-CM

## 2021-07-19 DIAGNOSIS — E1165 Type 2 diabetes mellitus with hyperglycemia: Secondary | ICD-10-CM

## 2021-07-19 LAB — MICROALBUMIN / CREATININE URINE RATIO
Creatinine,U: 76.3 mg/dL
Microalb Creat Ratio: 1 mg/g (ref 0.0–30.0)
Microalb, Ur: 0.7 mg/dL (ref 0.0–1.9)

## 2021-07-19 NOTE — Progress Notes (Signed)
Subjective:    Patient ID: Jim [name], male    DOB: 1966/05/01, 56 y.o.   MRN: [id number]  CC: Jim [name] is a 56 y.o. male who presents today for follow up.   HPI: Complains of cough and congestion x 2 weeks, unchanged.  Sore throat has resolved.  No sob, wheezing, cp, fever. He been using saline mist without resolve.   DM-compliant with metformin 1000 mg twice daily, glipizide IR 5 mg twice daily with meals. No hypoglycemic episodes.  Agreed to start [facility] 07/14/21 . He is going to start wearing libre today. FBG 223, post prandial 403 ( 1 hour after lunch) . Hasnt been checking blood sugar regularly. He has been on trulicity for 2 months. He felt nauseated on higher dose of trulicity.    Following with sports medicine, Dr. Tamala Julian for lumbar radiculopathy. No steroid use since 10/2020.   Due PCV20   HISTORY:  Past Medical History:  Diagnosis Date   Diabetes mellitus 2012   GERD (gastroesophageal reflux disease)    Past Surgical History:  Procedure Laterality Date   CLOSED REDUCTION ANKLE FRACTURE     COLONOSCOPY WITH PROPOFOL N/A 07/23/2020   Procedure: COLONOSCOPY WITH PROPOFOL;  Surgeon: Jonathon Bellows, MD;  [facility] ENDOSCOPY;  Service: Gastroenterology;  Laterality: N/A;   NO PAST SURGERIES     Family History  Problem Relation Age of Onset   Stroke Paternal Grandfather    Diabetes Brother    Parkinsonism Maternal Grandfather     Allergies: Jardiance [empagliflozin] and Ciprofloxacin Current Outpatient Medications on File Prior to Visit  Medication Sig Dispense Refill   acetaminophen (TYLENOL) 500 MG tablet Take 500 mg by mouth every 6 (six) hours as needed.     Continuous Blood Gluc Sensor (FREESTYLE LIBRE 3 SENSOR) MISC 1 applicator by Does not apply route every 14 (fourteen) days.     glipiZIDE (GLUCOTROL) 5 MG tablet TAKE 1 TABLET BY MOUTH TWO TIMES DAILY BEFORE A MEAL 180 tablet 3   Glucose Blood (BLOOD GLUCOSE TEST STRIPS) STRP True Result test  strips, to test blood glucose 3 times daily, use as directed 100 each 5   metFORMIN (GLUCOPHAGE XR) 500 MG 24 hr tablet Take 2 tablets (1,000 mg total) by mouth in the morning and at bedtime. 120 tablet 3   tirzepatide (MOUNJARO) 2.5 MG/0.5ML Pen Inject 2.5 mg into the skin once a week.     TRUEPLUS LANCETS 30G MISC USE AS DIRECTED 100 each 11   No current facility-administered medications on file prior to visit.    Social History   Tobacco Use   Smoking status: Never   Smokeless tobacco: Never  Substance Use Topics   Alcohol use: Yes    Comment: ocassionally   Drug use: No    Review of Systems  Constitutional:  Negative for chills and fever.  HENT:  Positive for congestion. Negative for sinus pressure, sinus pain and sore throat.   Respiratory:  Negative for cough, shortness of breath and wheezing.   Cardiovascular:  Negative for chest pain and palpitations.  Gastrointestinal:  Negative for nausea and vomiting.     Objective:    BP 118/72 (BP Location: Left Arm, Patient Position: Sitting, Cuff Size: Normal)    Pulse 97    Temp 99 F (37.2 C) (Oral)    Ht 5\' 9"  (1.753 m)    Wt 143 lb 6.4 oz (65 kg)    SpO2 98%    BMI 21.18 kg/m  BP Readings from Last 3 Encounters:  07/19/21 118/72  07/07/21 (!) 142/82  04/28/21 106/72   Wt Readings from Last 3 Encounters:  07/19/21 143 lb 6.4 oz (65 kg)  07/07/21 146 lb (66.2 kg)  04/28/21 141 lb (64 kg)    Physical Exam Vitals reviewed.  Constitutional:      Appearance: He is well-developed.  HENT:     Head: Normocephalic and atraumatic.     Right Ear: Hearing, tympanic membrane, ear canal and external ear normal. No decreased hearing noted. No drainage, swelling or tenderness. No middle ear effusion. Tympanic membrane is not injected, erythematous or bulging.     Left Ear: Hearing, tympanic membrane, ear canal and external ear normal. No decreased hearing noted. No drainage, swelling or tenderness.  No middle ear effusion. Tympanic  membrane is not injected, erythematous or bulging.     Nose: Nose normal.     Right Sinus: No maxillary sinus tenderness or frontal sinus tenderness.     Left Sinus: No maxillary sinus tenderness or frontal sinus tenderness.     Mouth/Throat:     Pharynx: Uvula midline. No oropharyngeal exudate or posterior oropharyngeal erythema.     Tonsils: No tonsillar abscesses.  Eyes:     Conjunctiva/sclera: Conjunctivae normal.  Cardiovascular:     Rate and Rhythm: Regular rhythm.     Heart sounds: Normal heart sounds.  Pulmonary:     Effort: Pulmonary effort is normal. No respiratory distress.     Breath sounds: Normal breath sounds. No wheezing, rhonchi or rales.  Musculoskeletal:     Right lower leg: No edema.     Left lower leg: No edema.  Lymphadenopathy:     Head:     Right side of head: No submental, submandibular, tonsillar, preauricular, posterior auricular or occipital adenopathy.     Left side of head: No submental, submandibular, tonsillar, preauricular, posterior auricular or occipital adenopathy.     Cervical: No cervical adenopathy.  Skin:    General: Skin is warm and dry.  Neurological:     Mental Status: He is alert.  Psychiatric:        Speech: Speech normal.        Behavior: Behavior normal.       Assessment & Plan:   Problem List Items Addressed This Visit       Endocrine   Type 2 diabetes mellitus with hyperglycemia, without long-term current use of insulin ([place]) - Primary    Lab Results  Component Value Date   HGBA1C 10.1 (H) 06/27/2021  Uncontrolled. He has been off Trulicity for 2 months now due to GI intolerance. Start mounjaro 2.5mg  today with close vigilance as it related to GI intolerance and weight loss. If either, will stop mounjaro and trial lantus. Would like to dc glipizide once we gain control of hyperglycemia. Continue metformin 1000mg  bid.      Relevant Medications   tirzepatide (MOUNJARO) 2.5 MG/0.5ML Pen   Other Relevant Orders    Microalbumin / creatinine urine ratio     Other   Nasal congestion    Afebrile.  No acute respiratory distress.  Discussed trial of Mucinex (plain) to see if congestion will resolve.  If not, appropriate to start antibiotic therapy based on duration of symptoms.  He will let me know how he is doing.      Other Visit Diagnoses     Need for pneumococcal vaccination       Relevant Orders   Pneumococcal conjugate vaccine 20-valent (  Prevnar 20) (Completed)        I am having Tami Lin. Moroni maintain his acetaminophen, TRUEplus Lancets 30G, BLOOD GLUCOSE TEST STRIPS, glipiZIDE, metFORMIN, Mounjaro, and FreeStyle Libre 3 Sensor.   No orders of the defined types were placed in this encounter.   Return precautions given.   Risks, benefits, and alternatives of the medications and treatment plan prescribed today were discussed, and patient expressed understanding.   Education regarding symptom management and diagnosis given to patient on AVS.  Continue to follow with Burnard Hawthorne, FNP for routine health maintenance.   Jim [name] and I agreed with plan.   Mable Paris, FNP

## 2021-07-19 NOTE — Patient Instructions (Addendum)
Plan mucinex ( not mucinex D) for congestion. Drink plenty of water.   Let me know if congestion doesn't resolve.

## 2021-07-19 NOTE — Telephone Encounter (Signed)
**Note Jim-Identified via Obfuscation** Medication Samples have been labeled and logged for the patient in prep for visit with PCP later today  Drug name: Mounjaro       Strength: 2.5 mg        Qty: 1 box  LOT: [id number]  Exp.Date: 01/25/2023  Dosing instructions: Inject 2.5 mg weekly  The patient has been instructed regarding the correct time, dose, and frequency of taking this medication, including desired effects and most common side effects.   Jim [name] 8:02 AM 07/19/2021   Attempted to start PA on Cover My Meds - noted "Member should be able to get the drug/product without a PA at this time"

## 2021-07-19 NOTE — Assessment & Plan Note (Signed)
Afebrile.  No acute respiratory distress.  Discussed trial of Mucinex (plain) to see if congestion will resolve.  If not, appropriate to start antibiotic therapy based on duration of symptoms.  He will let me know how he is doing.

## 2021-07-19 NOTE — Assessment & Plan Note (Signed)
Lab Results  Component Value Date   HGBA1C 10.1 (H) 06/27/2021   Uncontrolled. He has been off Trulicity for 2 months now due to GI intolerance. Start mounjaro 2.5mg  today with close vigilance as it related to GI intolerance and weight loss. If either, will stop mounjaro and trial lantus. Would like to dc glipizide once we gain control of hyperglycemia. Continue metformin 1000mg  bid.

## 2021-07-29 ENCOUNTER — Other Ambulatory Visit (HOSPITAL_COMMUNITY): Payer: Self-pay

## 2021-08-02 ENCOUNTER — Other Ambulatory Visit (HOSPITAL_COMMUNITY): Payer: Self-pay

## 2021-08-04 ENCOUNTER — Encounter: Payer: Self-pay | Admitting: Family

## 2021-08-04 ENCOUNTER — Other Ambulatory Visit: Payer: Self-pay

## 2021-08-04 ENCOUNTER — Other Ambulatory Visit (HOSPITAL_COMMUNITY): Payer: Self-pay

## 2021-08-04 MED ORDER — FREESTYLE LIBRE 3 SENSOR MISC
1.0000 | 3 refills | Status: DC
  Filled 2021-08-04: qty 2, 28d supply, fill #0
  Filled 2021-09-10: qty 2, 28d supply, fill #1
  Filled 2021-10-11: qty 2, 28d supply, fill #2
  Filled 2021-11-17: qty 2, 28d supply, fill #3

## 2021-08-10 ENCOUNTER — Other Ambulatory Visit: Payer: Self-pay | Admitting: Family

## 2021-08-10 ENCOUNTER — Other Ambulatory Visit (HOSPITAL_COMMUNITY): Payer: Self-pay

## 2021-08-12 ENCOUNTER — Other Ambulatory Visit (HOSPITAL_COMMUNITY): Payer: Self-pay

## 2021-08-12 ENCOUNTER — Telehealth (INDEPENDENT_AMBULATORY_CARE_PROVIDER_SITE_OTHER): Payer: 59 | Admitting: Pharmacist

## 2021-08-12 DIAGNOSIS — E1165 Type 2 diabetes mellitus with hyperglycemia: Secondary | ICD-10-CM | POA: Diagnosis not present

## 2021-08-12 MED ORDER — MOUNJARO 2.5 MG/0.5ML ~~LOC~~ SOAJ
2.5000 mg | SUBCUTANEOUS | 2 refills | Status: DC
  Filled 2021-08-12: qty 2, 28d supply, fill #0

## 2021-08-12 NOTE — Patient Instructions (Addendum)
Jim [name],   Our goal time in range is >70%. This generally will correspond with an A1c of less than 7.   Continue metformin 1000 mg twice daily and Mounjaro 2.5 mg weekly. Place a new sensor so that you have ample data to show when you see Joycelyn Schmid in a few weeks.   Moving forward, consider increasing dose of Mounjaro to 5 mg (if tolerated).   Take care!  Catie Darnelle Maffucci, PharmD

## 2021-08-12 NOTE — Progress Notes (Signed)
Chief Complaint  Patient presents with   Diabetes    Jim [name] is a 56 y.o. year old male who was referred for medication management by their primary care provider, Vidal Schwalbe, Yvetta Coder, FNP. They presented for a virtual visit in the context of the COVID-19 pandemic.   I connected with Jim [name] on 08/12/21 at 1:35 pm by video and verified that I am speaking with the correct person using two identifiers.   I discussed the limitations, risks, security and privacy concerns of performing an evaluation and management service by video and the availability of in person appointments. I also discussed with the patient that there may be a patient responsible charge related to this service. The patient expressed understanding and agreed to proceed.   Patient location:  home My Location:  clinic Persons on the video call:  myself and patient   Subjective: Diabetes:  Current medications: metformin XR 1000 mg twice daily, Mounjaro 2.5 mg weekly  Medications tried in the past: glipizide - recently discontinued due to hypoglycemia  Current glucose readings: using Libre 3 CGM; has not placed a new sensor since stopping glipizide  Date of Download: 1/11-1/24/23 % Time CGM is active: 100% Average Glucose: 175 mg/dL Glucose Management Indicator: 7.5  Glucose Variability: 27.8 (goal <36%) Time in Goal:  - Time in range 70-180: 54% - Time above range: 46% - Time below range: 0% Observed patterns: post prandial elevations  Patient denies hypoglycemic s/sx including dizziness, shakiness, sweating since stopping glipizide    Objective: Lab Results  Component Value Date   HGBA1C 10.1 (H) 06/27/2021    Lab Results  Component Value Date   CREATININE 0.87 06/27/2021   BUN 18 06/27/2021   NA 138 06/27/2021   K 4.3 06/27/2021   CL 102 06/27/2021   CO2 24 06/27/2021    Lab Results  Component Value Date   CHOL 152 09/03/2020   HDL 37.50 (L) 09/03/2020   LDLCALC 76 09/03/2020    LDLDIRECT 90.0 04/30/2015   TRIG 193.0 (H) 09/03/2020   CHOLHDL 4 09/03/2020    Medications Reviewed Today     Reviewed by De Hollingshead, RPH-CPP (Pharmacist) on 08/12/21 at 1340  Med List Status: <None>   Medication Order Taking? Sig Documenting Provider Last Dose Status Informant  acetaminophen (TYLENOL) 500 MG tablet [id number]  Take 500 mg by mouth every 6 (six) hours as needed. [provider]  Active   Continuous Blood Gluc Sensor (FREESTYLE LIBRE 3 SENSOR) [place] [id number] Yes Use as directed and change every 14 days Burnard Hawthorne, FNP Taking Active   Glucose Blood (BLOOD GLUCOSE TEST STRIPS) STRP [id number]  True Result test strips, to test blood glucose 3 times daily, use as directed Lacinda Axon, Jayce G, DO  Active   metFORMIN (GLUCOPHAGE XR) 500 MG 24 hr tablet [id number] Yes Take 2 tablets (1,000 mg total) by mouth in the morning and at bedtime. Burnard Hawthorne, FNP Taking Active   tirzepatide [facility]) 2.5 MG/0.5ML Pen [id number] Yes Inject 2.5 mg into the skin once a week. [provider] Taking Active   TRUEPLUS LANCETS 30G MISC [id number]  USE AS DIRECTED Coral Spikes, DO  Active             Assessment/Plan:   Diabetes: - Currently uncontrolled but improved  - Reviewed long term cardiovascular and renal outcomes of uncontrolled blood sugar - Reviewed goal A1c and goal time in range - Recommend to continue current regimen  at this time. Follow up with PCP as scheduled. Recommend increasing Mounjaro to 5 mg weekly when patient willing and able. Alternatively, consider trial of Farxiga - patient reported some fatigue with Jardiance, but with current improved sugar control he may be better able to tolerate SGLT2 therapy.  - Recommend to check glucose continuously with CGM    Follow Up Plan: Video visit in ~8 weeks  Catie Darnelle Maffucci, PharmD, [place], CPP Clinical Pharmacist [employer] at [employer] [phone]

## 2021-08-20 ENCOUNTER — Other Ambulatory Visit (HOSPITAL_COMMUNITY): Payer: Self-pay

## 2021-08-22 ENCOUNTER — Other Ambulatory Visit: Payer: Self-pay

## 2021-08-22 ENCOUNTER — Other Ambulatory Visit (HOSPITAL_COMMUNITY): Payer: Self-pay

## 2021-08-22 ENCOUNTER — Encounter: Payer: Self-pay | Admitting: Family

## 2021-08-22 ENCOUNTER — Ambulatory Visit: Payer: 59 | Admitting: Family

## 2021-08-22 VITALS — BP 128/78 | HR 79 | Temp 97.9°F | Wt 142.6 lb

## 2021-08-22 DIAGNOSIS — E1165 Type 2 diabetes mellitus with hyperglycemia: Secondary | ICD-10-CM | POA: Diagnosis not present

## 2021-08-22 MED ORDER — GLIPIZIDE ER 2.5 MG PO TB24
2.5000 mg | ORAL_TABLET | Freq: Every day | ORAL | 1 refills | Status: DC
  Filled 2021-08-22 – 2021-09-05 (×2): qty 90, 90d supply, fill #0

## 2021-08-22 NOTE — Progress Notes (Signed)
Subjective:    Patient ID: Jim [name], male    DOB: 1965/09/11, 56 y.o.   MRN: [id number]  CC: CORDERIUS SARACENI is a 56 y.o. male who presents today for follow up.   HPI: DM  follow up Complains of gas, burping, constipation and decreased appetite on mounjaro x 2 weeks.   Symptoms however improved since it has been one week since last dose. Appetite improved. He had been on mounjaro 2.5mg  x 4 weeks. He has been taking pepto bismul twice daily with some improvement.  No abdominal pain today, fever  He is compliant with Libre  Average glucose 163.  Percentage of time sensor is active 73%. GMI 7.2%   Hypoglycemic episode on Tuesday, Jan 24 and January 28 approximately 8 to 10 PM    He is compliant metformin 2000 mg daily.  Most recently he was on glipizide 5 mg twice daily with meals  Previously has been on Trulicity, Jardiance,lantus  Consult with endocrine 09/16/2019  HISTORY:  Past Medical History:  Diagnosis Date   Diabetes mellitus 2012   GERD (gastroesophageal reflux disease)    Past Surgical History:  Procedure Laterality Date   CLOSED REDUCTION ANKLE FRACTURE     COLONOSCOPY WITH PROPOFOL N/A 07/23/2020   Procedure: COLONOSCOPY WITH PROPOFOL;  Surgeon: Jonathon Bellows, MD;  Location: [facility] ENDOSCOPY;  Service: Gastroenterology;  Laterality: N/A;   NO PAST SURGERIES     Family History  Problem Relation Age of Onset   Stroke Paternal Grandfather    Diabetes Brother    Parkinsonism Maternal Grandfather     Allergies: Jardiance [empagliflozin] and Ciprofloxacin Current Outpatient Medications on File Prior to Visit  Medication Sig Dispense Refill   acetaminophen (TYLENOL) 500 MG tablet Take 500 mg by mouth every 6 (six) hours as needed.     Continuous Blood Gluc Sensor (FREESTYLE LIBRE 3 SENSOR) MISC Use as directed and change every 14 days 2 each 3   Glucose Blood (BLOOD GLUCOSE TEST STRIPS) STRP True Result test strips, to test blood glucose 3 times daily, use as  directed 100 each 5   metFORMIN (GLUCOPHAGE XR) 500 MG 24 hr tablet Take 2 tablets (1,000 mg total) by mouth in the morning and at bedtime. 120 tablet 3   TRUEPLUS LANCETS 30G MISC USE AS DIRECTED 100 each 11   No current facility-administered medications on file prior to visit.    Social History   Tobacco Use   Smoking status: Never   Smokeless tobacco: Never  Substance Use Topics   Alcohol use: Yes    Comment: ocassionally   Drug use: No    Review of Systems  Constitutional:  Negative for chills and fever.  Respiratory:  Negative for cough.   Cardiovascular:  Negative for chest pain and palpitations.  Gastrointestinal:  Negative for nausea and vomiting.     Objective:    BP 128/78 (BP Location: Right Arm, Patient Position: Sitting, Cuff Size: Small)    Pulse 79    Temp 97.9 F (36.6 C) (Oral)    Wt 142 lb 9.6 oz (64.7 kg)    SpO2 98%    BMI 21.06 kg/m  BP Readings from Last 3 Encounters:  08/22/21 128/78  07/19/21 118/72  07/07/21 (!) 142/82   Wt Readings from Last 3 Encounters:  08/22/21 142 lb 9.6 oz (64.7 kg)  07/19/21 143 lb 6.4 oz (65 kg)  07/07/21 146 lb (66.2 kg)    Physical Exam Vitals reviewed.  Constitutional:  Appearance: Normal appearance. He is well-developed.  Cardiovascular:     Rate and Rhythm: Regular rhythm.     Heart sounds: Normal heart sounds.  Pulmonary:     Effort: Pulmonary effort is normal. No respiratory distress.     Breath sounds: Normal breath sounds. No wheezing, rhonchi or rales.  Abdominal:     General: Bowel sounds are normal. There is no distension.     Palpations: Abdomen is soft. Abdomen is not rigid. There is no fluid wave or mass.     Tenderness: There is no abdominal tenderness. There is no guarding or rebound. Negative signs include Murphy's sign and McBurney's sign.  Skin:    General: Skin is warm and dry.  Neurological:     Mental Status: He is alert.  Psychiatric:        Speech: Speech normal.         Behavior: Behavior normal.       Assessment & Plan:   Problem List Items Addressed This Visit       Endocrine   Type 2 diabetes mellitus with hyperglycemia, without long-term current use of insulin ([place]) - Primary    Lab Results  Component Value Date   HGBA1C 10.1 (H) 06/27/2021  Intolerance to [facility] and symptoms resolved with stopping mounjaro.  DC Mounjaro.  Resume glipizide XL 2.5 mg with breakfast.  He will continue metformin 2000 mg/day.  Consider long-acting insulin and further titration of glipizide at follow-up      Relevant Medications   glipiZIDE (GLUCOTROL XL) 2.5 MG 24 hr tablet     I have discontinued Tami Lin. Lim's Mounjaro. I am also having him start on glipiZIDE. Additionally, I am having him maintain his acetaminophen, TRUEplus Lancets 30G, BLOOD GLUCOSE TEST STRIPS, metFORMIN, and FreeStyle Libre 3 Sensor.   Meds ordered this encounter  Medications   glipiZIDE (GLUCOTROL XL) 2.5 MG 24 hr tablet    Sig: Take 1 tablet (2.5 mg total) by mouth daily with breakfast.    Dispense:  90 tablet    Refill:  1    Order Specific Question:   Supervising Provider    Answer:   Crecencio Mc [[id number]]    Return precautions given.   Risks, benefits, and alternatives of the medications and treatment plan prescribed today were discussed, and patient expressed understanding.   Education regarding symptom management and diagnosis given to patient on AVS.  Continue to follow with Burnard Hawthorne, FNP for routine health maintenance.   Jim [name] and I agreed with plan.   Mable Paris, FNP

## 2021-08-22 NOTE — Patient Instructions (Signed)
Stop mounjaro  Start glipizide XR 2.5mg  with breakfast  Nice to see you!

## 2021-08-22 NOTE — Assessment & Plan Note (Signed)
Lab Results  Component Value Date   HGBA1C 10.1 (H) 06/27/2021   Intolerance to [facility] [place] and symptoms resolved with stopping mounjaro.  DC Mounjaro.  Resume glipizide XL 2.5 mg with breakfast.  He will continue metformin 2000 mg/day.  Consider long-acting insulin and further titration of glipizide at follow-up

## 2021-09-02 ENCOUNTER — Other Ambulatory Visit (HOSPITAL_COMMUNITY): Payer: Self-pay

## 2021-09-03 ENCOUNTER — Other Ambulatory Visit: Payer: Self-pay | Admitting: Family

## 2021-09-03 DIAGNOSIS — E1165 Type 2 diabetes mellitus with hyperglycemia: Secondary | ICD-10-CM

## 2021-09-05 ENCOUNTER — Other Ambulatory Visit (HOSPITAL_COMMUNITY): Payer: Self-pay

## 2021-09-05 MED ORDER — METFORMIN HCL ER 500 MG PO TB24
1000.0000 mg | ORAL_TABLET | Freq: Two times a day (BID) | ORAL | 3 refills | Status: DC
  Filled 2021-09-05: qty 120, 30d supply, fill #0
  Filled 2021-10-04: qty 120, 30d supply, fill #1
  Filled 2021-11-03: qty 120, 30d supply, fill #2
  Filled 2021-12-08: qty 120, 30d supply, fill #3

## 2021-09-05 NOTE — Telephone Encounter (Signed)
Pt need a refill on metformin ASAP. Pt is going out of town tomorrow

## 2021-09-10 ENCOUNTER — Other Ambulatory Visit (HOSPITAL_COMMUNITY): Payer: Self-pay

## 2021-09-19 ENCOUNTER — Ambulatory Visit: Payer: Self-pay | Admitting: Pharmacist

## 2021-09-19 NOTE — Chronic Care Management (AMB) (Signed)
?  Care Management  ? ?Note ? ?09/19/2021 ?Name: Jim [name] MRN: [id number] DOB: September 21, 1965 ? ? ? ?Closing pharmacy  case at this time. Will collaborate with Care Guide to outreach to schedule follow up with RN CM. Patient has clinic contact information for future questions or concerns.  ? ?Catie Darnelle Maffucci, PharmD, [place], CPP ?Clinical Pharmacist ?Therapist, music at [employer] ?[phone] ? ?

## 2021-09-21 NOTE — Progress Notes (Signed)
?Jim [name] D.O. ?[facility] ?[place] ?Phone: [phone] ?Subjective:   ?I, Jim [name], am serving as a scribe for Dr. Hulan Saas. ? ?This visit occurred during the SARS-CoV-2 public health emergency.  Safety protocols were in place, including screening questions prior to the visit, additional usage of staff PPE, and extensive cleaning of exam room while observing appropriate contact time as indicated for disinfecting solutions.  ? ? ?I'm seeing this patient by the request  of:  Burnard Hawthorne, FNP ? ?CC: Patient does have neck and back discomfort. ? ?FVC:[id number]  ?Jim [name] is a 56 y.o. male coming in with complaint of back and neck pain. OMT 07/07/2021. Patient states that he has not had any issues since last visit.  ? ?Medications patient has been prescribed: None ? ?Taking: ? ? ?  ? ? ? ? ?Reviewed prior external information including notes and imaging from previsou exam, outside providers and external EMR if available.  ? ?As well as notes that were available from care everywhere and other healthcare systems. ? ?Past medical history, social, surgical and family history all reviewed in electronic medical record.  No pertanent information unless stated regarding to the chief complaint.  ? ?Past Medical History:  ?Diagnosis Date  ? Diabetes mellitus 2012  ? GERD (gastroesophageal reflux disease)   ?  ?Allergies  ?Allergen Reactions  ? Jardiance [Empagliflozin]   ?  Fatigue, weight loss  ? Ciprofloxacin Rash  ? ? ? ?Review of Systems: ? No headache, visual changes, nausea, vomiting, diarrhea, constipation, dizziness, abdominal pain, skin rash, fevers, chills, night sweats, weight loss, swollen lymph nodes, body aches, joint swelling, chest pain, shortness of breath, mood changes. POSITIVE muscle aches ? ?Objective  ?Blood pressure 118/82, pulse (!) 102, height '5\' 9"'$  (1.753 m), weight 141 lb (64 kg), SpO2 99 %. ?  ?General: No apparent distress alert  and oriented x3 mood and affect normal, dressed appropriately.  ?HEENT: Pupils equal, extraocular movements intact  ?Respiratory: Patient's speak in full sentences and does not appear short of breath  ?Cardiovascular: No lower extremity edema, non tender, no erythema  ?Back exam he does have some loss of lordosis.  Some tenderness to palpation in the paraspinal musculature.  Patient does have very mild tightness with FABER test right greater than left.  Neck exam does have some limited sidebending bilaterally. ? ?Osteopathic findings ? ?C7 flexed rotated and side bent right ?C5 flexed rotated and side bent left ?T3 extended rotated and side bent right inhaled rib ?T9 extended rotated and side bent left ?L2 flexed rotated and side bent right ?Sacrum right on right ? ? ? ? ?  ?Assessment and Plan: ? ?Lumbar radiculopathy ?Low back exam looks great overall.  Patient still has some mild tightness.  Did respond extremely well to osteopathic manipulation.  No true radicular symptoms at the moment.  Encourage patient to continue to work on control of patient's blood sugar.  Follow-up with me again in 2 months  ? ?Nonallopathic problems ? ?Decision today to treat with OMT was based on Physical Exam ? ?After verbal consent patient was treated with HVLA, ME, FPR techniques in cervical, rib, thoracic, lumbar, and sacral  areas ? ?Patient tolerated the procedure well with improvement in symptoms ? ?Patient given exercises, stretches and lifestyle modifications ? ?See medications in patient instructions if given ? ?Patient will follow up in 4-8 weeks ? ?  ? ? ?The above documentation has been reviewed and  is accurate and complete Jim Pulley, DO ? ? ? ?  ? ? Note: This dictation was prepared with Dragon dictation along with smaller phrase technology. Any transcriptional errors that result from this process are unintentional.    ?  ?  ? ?

## 2021-09-23 ENCOUNTER — Encounter: Payer: Self-pay | Admitting: Family Medicine

## 2021-09-23 ENCOUNTER — Ambulatory Visit: Payer: 59 | Admitting: Family Medicine

## 2021-09-23 ENCOUNTER — Other Ambulatory Visit: Payer: Self-pay

## 2021-09-23 VITALS — BP 118/82 | HR 102 | Ht 69.0 in | Wt 141.0 lb

## 2021-09-23 DIAGNOSIS — M5416 Radiculopathy, lumbar region: Secondary | ICD-10-CM | POA: Diagnosis not present

## 2021-09-23 DIAGNOSIS — R634 Abnormal weight loss: Secondary | ICD-10-CM

## 2021-09-23 DIAGNOSIS — M9903 Segmental and somatic dysfunction of lumbar region: Secondary | ICD-10-CM | POA: Diagnosis not present

## 2021-09-23 DIAGNOSIS — M9902 Segmental and somatic dysfunction of thoracic region: Secondary | ICD-10-CM

## 2021-09-23 DIAGNOSIS — M9901 Segmental and somatic dysfunction of cervical region: Secondary | ICD-10-CM

## 2021-09-23 DIAGNOSIS — M9908 Segmental and somatic dysfunction of rib cage: Secondary | ICD-10-CM | POA: Diagnosis not present

## 2021-09-23 DIAGNOSIS — M9904 Segmental and somatic dysfunction of sacral region: Secondary | ICD-10-CM

## 2021-09-23 NOTE — Assessment & Plan Note (Signed)
Discussed with patient again about weight loss.  Patient is still having difficulty with his blood sugars.  Patient will follow-up with primary care.  We will continue to monitor as well. ?

## 2021-09-23 NOTE — Patient Instructions (Signed)
Have fun in [place] ?See me in 2 months ?

## 2021-09-23 NOTE — Assessment & Plan Note (Signed)
Low back exam looks great overall.  Patient still has some mild tightness.  Did respond extremely well to osteopathic manipulation.  No true radicular symptoms at the moment.  Encourage patient to continue to work on control of patient's blood sugar.  Follow-up with me again in 2 months ?

## 2021-10-04 ENCOUNTER — Other Ambulatory Visit (HOSPITAL_COMMUNITY): Payer: Self-pay

## 2021-10-05 ENCOUNTER — Telehealth: Payer: Self-pay

## 2021-10-05 ENCOUNTER — Other Ambulatory Visit (HOSPITAL_COMMUNITY): Payer: Self-pay

## 2021-10-05 NOTE — Chronic Care Management (AMB) (Signed)
?  Chronic Care Management  ? ?Note ? ?10/05/2021 ?Name: TORRYN HUDSPETH MRN: [id number] DOB: March 25, 1966 ? ?STARLING CHRISTOFFERSON is a 56 y.o. year old male who is a primary care patient of Burnard Hawthorne, FNP. ZEIN HELBING is currently enrolled in care management services. An additional referral for RNCM was placed.  ? ?Follow up plan: ?Patient declines further follow up and engagement by the care management team. Appropriate care team members and provider have been notified via electronic communication.  ? ?Noreene Larsson, RMA ?Care Guide, Embedded Care Coordination ?[facility]  ?[place], [place] [address] ?Direct Dial: [phone] ?Museum/gallery [email or web address] ?Website: [email or web address]  ? ?

## 2021-10-07 ENCOUNTER — Telehealth: Payer: 59

## 2021-10-11 ENCOUNTER — Other Ambulatory Visit (HOSPITAL_COMMUNITY): Payer: Self-pay

## 2021-10-27 ENCOUNTER — Encounter: Payer: Self-pay | Admitting: Family

## 2021-10-28 ENCOUNTER — Encounter: Payer: Self-pay | Admitting: Family

## 2021-10-28 ENCOUNTER — Ambulatory Visit: Payer: 59 | Admitting: Family

## 2021-10-28 ENCOUNTER — Other Ambulatory Visit (HOSPITAL_COMMUNITY): Payer: Self-pay

## 2021-10-28 VITALS — BP 126/82 | HR 97 | Temp 96.9°F | Ht 69.0 in | Wt 144.2 lb

## 2021-10-28 DIAGNOSIS — R0981 Nasal congestion: Secondary | ICD-10-CM | POA: Diagnosis not present

## 2021-10-28 DIAGNOSIS — E1165 Type 2 diabetes mellitus with hyperglycemia: Secondary | ICD-10-CM | POA: Diagnosis not present

## 2021-10-28 LAB — POCT GLYCOSYLATED HEMOGLOBIN (HGB A1C): Hemoglobin A1C: 8 % — AB (ref 4.0–5.6)

## 2021-10-28 LAB — LIPID PANEL
Cholesterol: 159 mg/dL (ref 0–200)
HDL: 41.7 mg/dL (ref >39.00)
LDL Cholesterol: 96 mg/dL (ref 0–99)
NonHDL: 117.51
Total CHOL/HDL Ratio: 4
Triglycerides: 106 mg/dL (ref 0.0–149.0)
VLDL: 21.2 mg/dL (ref 0.0–40.0)

## 2021-10-28 MED ORDER — GLIPIZIDE 5 MG PO TABS
5.0000 mg | ORAL_TABLET | Freq: Two times a day (BID) | ORAL | 3 refills | Status: DC
  Filled 2021-10-28: qty 60, 30d supply, fill #0
  Filled 2021-12-08: qty 60, 30d supply, fill #1
  Filled 2022-01-10: qty 60, 30d supply, fill #2
  Filled 2022-02-06: qty 60, 30d supply, fill #3

## 2021-10-28 NOTE — Assessment & Plan Note (Signed)
Lab Results  ?Component Value Date  ? HGBA1C 8.0 (A) 10/28/2021  ? ?Significant improvement. FBG between 100-150.  Patient feels much more comfortable and prefers to increase glipizide.  He has tolerated this medication well in his past.  He is very aware of the risk of hypoglycemia particularly with low carbohydrate or small meals.Discontinue glipizide XL.  Start glipizide 5 mg twice daily with meals.  Counseled him on risk of hypoglycemia and as his blood sugars start to approach 100 fasting most consistently to consider decreasing glipizide 2.5 mg twice daily with meal.  Continue metformin 2000 mg /day.  He verbalized understanding of all.  Close follow-up ?

## 2021-10-28 NOTE — Patient Instructions (Signed)
I have discontinued glipizide extended release as discussed.  I have sent in immediate release glipizide 5 mg to take twice per day with a meal.  If you are planning to eat a lighter meal or a low carbohydrate meal, please take 2.5 mg in instead of 5 mg to avoid hypoglycemia. ?If fasting blood sugars are approaching 100, I would also be inclined to decrease glipizide to 2.5 mg twice per day to avoid hypoglycemia ?Please let me know of any concerns or questions.   ? ?Please continue over-the-counter Mucinex and let me know if cough does not resolve. ?

## 2021-10-28 NOTE — Progress Notes (Signed)
? ?Subjective:  ? ? Patient ID: Jim [name], male    DOB: Feb 16, 1966, 56 y.o.   MRN: [id number] ? ?CC: Jim [name] is a 56 y.o. male who presents today for follow up.  ? ?HPI: Complains of nasal congestion x 6 days ago, with improvement.  ?He has been taking mucinex. Occasional cough.  ?No fever, sob, wheezing, fever, ear pain.  ? ? ? ?DM-he is compliant with metformin 2000 mg/day, glipizide 2.5 mg with breakfast and dinner.  He has been taking glipizide '5mg'$  once daily prn if felt glucose was high. He is no longer on mounjaro. Using [place] . Did have an instance when [place] alarmed him blood sugar 68; he had a smaller meal at that time. Dinner is biggest meal of day. FBG 100-150. Post prandial 180. He plans to work more on dietary changes.  ?Normal microalbumin 3 months ago ?HISTORY:  ?Past Medical History:  ?Diagnosis Date  ? Diabetes mellitus 2012  ? GERD (gastroesophageal reflux disease)   ? ?Past Surgical History:  ?Procedure Laterality Date  ? CLOSED REDUCTION ANKLE FRACTURE    ? COLONOSCOPY WITH PROPOFOL N/A 07/23/2020  ? Procedure: COLONOSCOPY WITH PROPOFOL;  Surgeon: Jim Bellows, MD;  Location: [facility] ENDOSCOPY;  Service: Gastroenterology;  Laterality: N/A;  ? NO PAST SURGERIES    ? ?Family History  ?Problem Relation Age of Onset  ? Stroke Paternal Grandfather   ? Diabetes Brother   ? Parkinsonism Maternal Grandfather   ? ? ?Allergies: Jardiance [empagliflozin] and Ciprofloxacin ?Current Outpatient Medications on File Prior to Visit  ?Medication Sig Dispense Refill  ? acetaminophen (TYLENOL) 500 MG tablet Take 500 mg by mouth every 6 (six) hours as needed.    ? Continuous Blood Gluc Sensor (FREESTYLE LIBRE 3 SENSOR) MISC Use as directed and change every 14 days 2 each 3  ? Glucose Blood (BLOOD GLUCOSE TEST STRIPS) STRP True Result test strips, to test blood glucose 3 times daily, use as directed 100 each 5  ? metFORMIN (GLUCOPHAGE-XR) 500 MG 24 hr tablet Take 2 tablets (1,000 mg total) by mouth in the  morning and at bedtime. 120 tablet 3  ? TRUEPLUS LANCETS 30G MISC USE AS DIRECTED 100 each 11  ? ?No current facility-administered medications on file prior to visit.  ? ? ?Social History  ? ?Tobacco Use  ? Smoking status: Never  ? Smokeless tobacco: Never  ?Substance Use Topics  ? Alcohol use: Yes  ?  Comment: ocassionally  ? Drug use: No  ? ? ?Review of Systems  ?Constitutional:  Negative for chills and fever.  ?HENT:  Positive for congestion.   ?Respiratory:  Positive for cough.   ?Cardiovascular:  Negative for chest pain and palpitations.  ?Gastrointestinal:  Negative for nausea and vomiting.  ?   ?Objective:  ?  ?BP 126/82 (BP Location: Right Arm, Patient Position: Sitting, Cuff Size: Normal)   Pulse 97   Temp (!) 96.9 ?F (36.1 ?C) (Temporal)   Ht '5\' 9"'$  (1.753 m)   Wt 144 lb 3.2 oz (65.4 kg)   SpO2 99%   BMI 21.29 kg/m?  ?BP Readings from Last 3 Encounters:  ?10/28/21 126/82  ?09/23/21 118/82  ?08/22/21 128/78  ? ?Wt Readings from Last 3 Encounters:  ?10/28/21 144 lb 3.2 oz (65.4 kg)  ?09/23/21 141 lb (64 kg)  ?08/22/21 142 lb 9.6 oz (64.7 kg)  ? ? ?Physical Exam ?Vitals reviewed.  ?Constitutional:   ?   Appearance: He is well-developed.  ?HENT:  ?  Head: Normocephalic and atraumatic.  ?   Right Ear: Hearing, tympanic membrane, ear canal and external ear normal. No decreased hearing noted. No drainage, swelling or tenderness. No middle ear effusion. Tympanic membrane is not injected, erythematous or bulging.  ?   Left Ear: Hearing, tympanic membrane, ear canal and external ear normal. No decreased hearing noted. No drainage, swelling or tenderness.  No middle ear effusion. Tympanic membrane is not injected, erythematous or bulging.  ?   Nose: Nose normal.  ?   Right Sinus: No maxillary sinus tenderness or frontal sinus tenderness.  ?   Left Sinus: No maxillary sinus tenderness or frontal sinus tenderness.  ?   Mouth/Throat:  ?   Pharynx: Uvula midline. No oropharyngeal exudate or posterior oropharyngeal  erythema.  ?   Tonsils: No tonsillar abscesses.  ?Eyes:  ?   Conjunctiva/sclera: Conjunctivae normal.  ?Cardiovascular:  ?   Rate and Rhythm: Regular rhythm.  ?   Heart sounds: Normal heart sounds.  ?Pulmonary:  ?   Effort: Pulmonary effort is normal. No respiratory distress.  ?   Breath sounds: Normal breath sounds. No wheezing, rhonchi or rales.  ?Lymphadenopathy:  ?   Head:  ?   Right side of head: No submental, submandibular, tonsillar, preauricular, posterior auricular or occipital adenopathy.  ?   Left side of head: No submental, submandibular, tonsillar, preauricular, posterior auricular or occipital adenopathy.  ?   Cervical: No cervical adenopathy.  ?Skin: ?   General: Skin is warm and dry.  ?Neurological:  ?   Mental Status: He is alert.  ?Psychiatric:     ?   Speech: Speech normal.     ?   Behavior: Behavior normal.  ? ? ?   ?Assessment & Plan:  ? ?Problem List Items Addressed This Visit   ? ?  ? Endocrine  ? Type 2 diabetes mellitus with hyperglycemia, without long-term current use of insulin (HCC) - Primary  ?  Lab Results  ?Component Value Date  ? HGBA1C 8.0 (A) 10/28/2021  ?Significant improvement. FBG between 100-150.  Patient feels much more comfortable and prefers to increase glipizide.  He has tolerated this medication well in his past.  He is very aware of the risk of hypoglycemia particularly with low carbohydrate or small meals.Discontinue glipizide XL.  Start glipizide 5 mg twice daily with meals.  Counseled him on risk of hypoglycemia and as his blood sugars start to approach 100 fasting most consistently to consider decreasing glipizide 2.5 mg twice daily with meal.  Continue metformin 2000 mg /day.  He verbalized understanding of all.  Close follow-up ?  ?  ? Relevant Medications  ? glipiZIDE (GLUCOTROL) 5 MG tablet  ? Other Relevant Orders  ? POCT HgB A1C (Completed)  ? Lipid panel  ?  ? Other  ? Nasal congestion  ?  Afebrile.  Reassuring HEENT exam.  Symptoms are improving.  Patient and I  agreed most likely viral etiology and we jointly agreed to continue conservative management with over-the-counter Mucinex at home.  He will certainly let me know if symptoms were to worsen or fail to resolve completely. ? ?  ?  ? ? ? ?I have discontinued Tami Lin. Fenstermacher's glipiZIDE. I am also having him start on glipiZIDE. Additionally, I am having him maintain his acetaminophen, TRUEplus Lancets 30G, BLOOD GLUCOSE TEST STRIPS, FreeStyle Libre 3 Sensor, and metFORMIN. ? ? ?Meds ordered this encounter  ?Medications  ? glipiZIDE (GLUCOTROL) 5 MG tablet  ?  Sig:  Take 1 tablet (5 mg total) by mouth 2 (two) times daily before a meal.  ?  Dispense:  60 tablet  ?  Refill:  3  ?  Order Specific Question:   Supervising Provider  ?  Answer:   Crecencio Mc [[id number]]  ? ? ?Return precautions given.  ? ?Risks, benefits, and alternatives of the medications and treatment plan prescribed today were discussed, and patient expressed understanding.  ? ?Education regarding symptom management and diagnosis given to patient on AVS. ? ?Continue to follow with Burnard Hawthorne, FNP for routine health maintenance.  ? ?Gara Kroner and I agreed with plan.  ? ?Mable Paris, FNP ? ? ?

## 2021-10-28 NOTE — Assessment & Plan Note (Signed)
Afebrile.  Reassuring HEENT exam.  Symptoms are improving.  Patient and I agreed most likely viral etiology and we jointly agreed to continue conservative management with over-the-counter Mucinex at home.  He will certainly let me know if symptoms were to worsen or fail to resolve completely. ?

## 2021-11-03 ENCOUNTER — Other Ambulatory Visit (HOSPITAL_COMMUNITY): Payer: Self-pay

## 2021-11-17 ENCOUNTER — Other Ambulatory Visit (HOSPITAL_COMMUNITY): Payer: Self-pay

## 2021-11-25 ENCOUNTER — Ambulatory Visit: Payer: 59 | Admitting: Family

## 2021-11-30 NOTE — Progress Notes (Signed)
Jim [name] [place] [address] [place] Phone: [phone] Subjective:   [name], am serving as a scribe for Dr. Hulan Saas. This visit occurred during the SARS-CoV-2 public health emergency.  Safety protocols were in place, including screening questions prior to the visit, additional usage of staff PPE, and extensive cleaning of exam room while observing appropriate contact time as indicated for disinfecting solutions.   I'm seeing this patient by the request  of:  Burnard Hawthorne, FNP  CC: back and neck pain   [id number]  DAWAUN BRANCATO is a 56 y.o. male coming in with complaint of back and neck pain. OMT 09/23/2021. Patient states same per usual. Off one of his diabetic medications. No new complaints.  Medications patient has been prescribed: None           Reviewed prior external information including notes and imaging from previsou exam, outside providers and external EMR if available.   As well as notes that were available from care everywhere and other healthcare systems.  Past medical history, social, surgical and family history all reviewed in electronic medical record.  No pertanent information unless stated regarding to the chief complaint.   Past Medical History:  Diagnosis Date   Diabetes mellitus 2012   GERD (gastroesophageal reflux disease)     Allergies  Allergen Reactions   Jardiance [Empagliflozin]     Fatigue, weight loss   Ciprofloxacin Rash     Review of Systems:  No headache, visual changes, nausea, vomiting, diarrhea, constipation, dizziness, abdominal pain, skin rash, fevers, chills, night sweats, weight loss, swollen lymph nodes, body aches, joint swelling, chest pain, shortness of breath, mood changes. POSITIVE muscle aches  Objective  Blood pressure 116/68, pulse 90, height '5\' 9"'$  (1.753 m), weight 143 lb (64.9 kg), SpO2 98 %.   General: No apparent distress alert and oriented x3  mood and affect normal, dressed appropriately.  HEENT: Pupils equal, extraocular movements intact  Respiratory: Patient's speak in full sentences and does not appear short of breath  Cardiovascular: No lower extremity edema, non tender, no erythema  Neuro: Cranial nerves II through XII are intact, neurovascularly intact in all extremities with 2+ DTRs and 2+ pulses.  Gait normal with good balance and coordination.  MSK:  Non tender with full range of motion and good stability and symmetric strength and tone of shoulders, elbows, wrist, hip, knee and ankles bilaterally.  Back - Normal skin, Spine with normal alignment and no deformity.  No tenderness to vertebral process palpation.  Paraspinous muscles are not tender and without spasm.   Range of motion is full at neck and lumbar sacral regions  Osteopathic findings  C2 flexed rotated and side bent right C6 flexed rotated and side bent left T3 extended rotated and side bent right inhaled rib T8 extended rotated and side bent left L2 flexed rotated and side bent right Sacrum right on right       Assessment and Plan:  Lumbar radiculopathy Patient is doing relatively well. Discussed HEP Discussed which activities to do  RTC in 6-8 weeks       Nonallopathic problems  Decision today to treat with OMT was based on Physical Exam  After verbal consent patient was treated with HVLA, ME, FPR techniques in cervical, rib, thoracic, lumbar, and sacral  areas  Patient tolerated the procedure well with improvement in symptoms  Patient given exercises, stretches and lifestyle modifications  See medications in patient instructions if  given  Patient will follow up in 4-8 weeks      The above documentation has been reviewed and is accurate and complete Lyndal Pulley, DO        Note: This dictation was prepared with Dragon dictation along with smaller phrase technology. Any transcriptional errors that result from this process are  unintentional.

## 2021-12-02 ENCOUNTER — Ambulatory Visit: Payer: 59 | Admitting: Family Medicine

## 2021-12-02 VITALS — BP 116/68 | HR 90 | Ht 69.0 in | Wt 143.0 lb

## 2021-12-02 DIAGNOSIS — M9904 Segmental and somatic dysfunction of sacral region: Secondary | ICD-10-CM | POA: Diagnosis not present

## 2021-12-02 DIAGNOSIS — M9908 Segmental and somatic dysfunction of rib cage: Secondary | ICD-10-CM | POA: Diagnosis not present

## 2021-12-02 DIAGNOSIS — M9902 Segmental and somatic dysfunction of thoracic region: Secondary | ICD-10-CM

## 2021-12-02 DIAGNOSIS — M9903 Segmental and somatic dysfunction of lumbar region: Secondary | ICD-10-CM

## 2021-12-02 DIAGNOSIS — M9901 Segmental and somatic dysfunction of cervical region: Secondary | ICD-10-CM

## 2021-12-02 DIAGNOSIS — M5416 Radiculopathy, lumbar region: Secondary | ICD-10-CM | POA: Diagnosis not present

## 2021-12-02 NOTE — Patient Instructions (Signed)
Good to see you! Overall doing well Can't wait to see pics of grand baby See you again in 7-8 weeks

## 2021-12-02 NOTE — Assessment & Plan Note (Signed)
Patient is doing relatively well. Discussed HEP Discussed which activities to do  RTC in 6-8 weeks

## 2021-12-08 ENCOUNTER — Other Ambulatory Visit (HOSPITAL_COMMUNITY): Payer: Self-pay

## 2021-12-13 ENCOUNTER — Encounter: Payer: Self-pay | Admitting: Family

## 2021-12-23 ENCOUNTER — Other Ambulatory Visit: Payer: Self-pay | Admitting: Family

## 2021-12-23 ENCOUNTER — Other Ambulatory Visit (HOSPITAL_COMMUNITY): Payer: Self-pay

## 2021-12-23 MED ORDER — FREESTYLE LIBRE 3 SENSOR MISC
1.0000 | 3 refills | Status: DC
  Filled 2021-12-23: qty 2, 28d supply, fill #0
  Filled 2022-01-27: qty 2, 28d supply, fill #1
  Filled 2022-03-03: qty 2, 28d supply, fill #2
  Filled 2022-03-22: qty 2, 28d supply, fill #3

## 2022-01-10 ENCOUNTER — Other Ambulatory Visit (HOSPITAL_COMMUNITY): Payer: Self-pay

## 2022-01-10 ENCOUNTER — Other Ambulatory Visit: Payer: Self-pay | Admitting: Family

## 2022-01-10 DIAGNOSIS — E1165 Type 2 diabetes mellitus with hyperglycemia: Secondary | ICD-10-CM

## 2022-01-10 MED ORDER — METFORMIN HCL ER 500 MG PO TB24
1000.0000 mg | ORAL_TABLET | Freq: Two times a day (BID) | ORAL | 3 refills | Status: DC
  Filled 2022-01-10: qty 120, 30d supply, fill #0
  Filled 2022-02-06: qty 120, 30d supply, fill #1
  Filled 2022-03-16: qty 120, 30d supply, fill #2
  Filled 2022-04-13: qty 120, 30d supply, fill #3

## 2022-01-25 NOTE — Progress Notes (Signed)
Jim [name] [place] [address] [place] Phone: [phone] Subjective:   Jim [name], am serving as a scribe for Dr. Hulan [name].  I'm seeing this patient by the request  of:  Jim Hawthorne, FNP  CC: Back and neck pain  [id number]  Jim [name] is a 57 y.o. male coming in with complaint of back and neck pain. OMT 12/02/2021. Patient states doing well. Same per usual. No new complaints.  Medications patient has been prescribed: None  Taking:         Reviewed prior external information including notes and imaging from previsou exam, outside providers and external EMR if available.   As well as notes that were available from care everywhere and other healthcare systems.  Past medical history, social, surgical and family history all reviewed in electronic medical record.  No pertanent information unless stated regarding to the chief complaint.   Past Medical History:  Diagnosis Date   Diabetes mellitus 2012   GERD (gastroesophageal reflux disease)     Allergies  Allergen Reactions   Jardiance [Empagliflozin]     Fatigue, weight loss   Ciprofloxacin Rash     Review of Systems:  No headache, visual changes, nausea, vomiting, diarrhea, constipation, dizziness, abdominal pain, skin rash, fevers, chills, night sweats, weight loss, swollen lymph nodes, body aches, joint swelling, chest pain, shortness of breath, mood changes. POSITIVE muscle aches  Objective  Blood pressure 110/64, pulse 68, height '5\' 9"'$  (1.753 m), weight 145 lb (65.8 kg), SpO2 96 %.   General: No apparent distress alert and oriented x3 mood and affect normal, dressed appropriately.  HEENT: Pupils equal, extraocular movements intact  Respiratory: Patient's speak in full sentences and does not appear short of breath  Cardiovascular: No lower extremity edema, non tender, no erythema  Gait MSK:  Back neck exam does have some mild tightness noted.   Tightness noted more over the left hip area.  Seems to be more of the gluteus minimus.  Patient does have pain on the outside but no groin pain.  Does have tightness noted with FABER test.  Negative straight leg test.  Low back exam still has a loss of lordosis though noted.  Osteopathic findings  C2 flexed rotated and side bent right C6 flexed rotated and side bent left T3 extended rotated and side bent right inhaled rib T9 extended rotated and side bent left L2 flexed rotated and side bent right Sacrum r left on left       Assessment and Plan:  Lumbar radiculopathy Has had a history of lumbar radiculopathy.  Concern for potential ventral L3 nerve impingement that could be causing more of the pain on the lateral hip.  More concerned though that patient likely has more of a gluteal minimus tendinitis.  Discussed which activities to do and which ones to avoid otherwise.  Follow-up with me again in 6 to 8 weeks.    Nonallopathic problems  Decision today to treat with OMT was based on Physical Exam  After verbal consent patient was treated with HVLA, ME, FPR techniques in cervical, rib, thoracic, lumbar, and sacral  areas  Patient tolerated the procedure well with improvement in symptoms  Patient given exercises, stretches and lifestyle modifications  See medications in patient instructions if given  Patient will follow up in 4-8 weeks    The above documentation has been reviewed and is accurate and complete Jim Pulley, DO  Note: This dictation was prepared with Dragon dictation along with smaller phrase technology. Any transcriptional errors that result from this process are unintentional.

## 2022-01-27 ENCOUNTER — Ambulatory Visit: Payer: 59 | Admitting: Family Medicine

## 2022-01-27 ENCOUNTER — Ambulatory Visit (INDEPENDENT_AMBULATORY_CARE_PROVIDER_SITE_OTHER): Payer: 59

## 2022-01-27 VITALS — BP 110/64 | HR 68 | Ht 69.0 in | Wt 145.0 lb

## 2022-01-27 DIAGNOSIS — M9901 Segmental and somatic dysfunction of cervical region: Secondary | ICD-10-CM

## 2022-01-27 DIAGNOSIS — M9908 Segmental and somatic dysfunction of rib cage: Secondary | ICD-10-CM | POA: Diagnosis not present

## 2022-01-27 DIAGNOSIS — M9902 Segmental and somatic dysfunction of thoracic region: Secondary | ICD-10-CM | POA: Diagnosis not present

## 2022-01-27 DIAGNOSIS — M25552 Pain in left hip: Secondary | ICD-10-CM

## 2022-01-27 DIAGNOSIS — M9903 Segmental and somatic dysfunction of lumbar region: Secondary | ICD-10-CM

## 2022-01-27 DIAGNOSIS — M5416 Radiculopathy, lumbar region: Secondary | ICD-10-CM | POA: Diagnosis not present

## 2022-01-27 DIAGNOSIS — M9904 Segmental and somatic dysfunction of sacral region: Secondary | ICD-10-CM | POA: Diagnosis not present

## 2022-01-27 NOTE — Patient Instructions (Signed)
L hip xray See me again in 6-8 weeks

## 2022-01-27 NOTE — Assessment & Plan Note (Signed)
Has had a history of lumbar radiculopathy.  Concern for potential ventral L3 nerve impingement that could be causing more of the pain on the lateral hip.  More concerned though that patient likely has more of a gluteal minimus tendinitis.  Discussed which activities to do and which ones to avoid otherwise.  Follow-up with me again in 6 to 8 weeks.

## 2022-01-28 ENCOUNTER — Other Ambulatory Visit (HOSPITAL_COMMUNITY): Payer: Self-pay

## 2022-02-03 ENCOUNTER — Encounter: Payer: Self-pay | Admitting: Family

## 2022-02-03 ENCOUNTER — Ambulatory Visit: Payer: 59 | Admitting: Family

## 2022-02-03 VITALS — BP 128/78 | HR 101 | Temp 98.4°F | Ht 69.0 in | Wt 144.6 lb

## 2022-02-03 DIAGNOSIS — E1165 Type 2 diabetes mellitus with hyperglycemia: Secondary | ICD-10-CM

## 2022-02-03 DIAGNOSIS — E785 Hyperlipidemia, unspecified: Secondary | ICD-10-CM

## 2022-02-03 LAB — POCT GLYCOSYLATED HEMOGLOBIN (HGB A1C): Hemoglobin A1C: 8.3 % — AB (ref 4.0–5.6)

## 2022-02-03 NOTE — Assessment & Plan Note (Signed)
Lab Results  Component Value Date   HGBA1C 8.3 (A) 88/82/8003   Tried Trulicity, Mounjaro due to GI intolerance. On jardiance , he felt fatigued.  CGM active 58%.  Average glucose 188, GMI 7.8%.  Very low range 0%, low range 1%.  Target range 46%.  Counseled on risk of hypoglycemia with glipizide.  His preference is to increase glipizide versus starting basal insulin.  Increase glipizide to 10 mg with largest meal of the day in the morning which fits trend of glucose elevating after breakfast.  He will be maintained on glipizide 5 mg with dinner.  Advised him to hold glipizide if skipping dinner or having a low carbohydrate meal as I suspect this is the reason for his previous hypoglycemic episodes.  Continue metformin 2000 mg a day.  He will let me know how he is doing

## 2022-02-03 NOTE — Progress Notes (Signed)
Subjective:    Patient ID: Jim [name], male    DOB: 03-23-1966, 56 y.o.   MRN: [id number]  CC: [name] is a 56 y.o. male who presents today for follow up.   HPI: Feels well today  No new complaints    DM- no longer on glipizide. Started glipizide '5mg'$  BID with meals with breakfast and dinner. He has two of episodes of low blood usually around early morning hours 1-3 am in which CGM alarm went off. Breakfast is cereal. He eats less protein at dinner.   Compliant metformin 2000 mg/day.  He is wearing libre  Tried Trulicity, [place] due to GI intolerance. On jardiance , he felt fatigued.  He feels better not on GLP and he has gained one pound which he is pleased with .   HLD- he has declined statin therapy in the past   HISTORY:  Past Medical History:  Diagnosis Date   Diabetes mellitus 2012   GERD (gastroesophageal reflux disease)    Past Surgical History:  Procedure Laterality Date   CLOSED REDUCTION ANKLE FRACTURE     COLONOSCOPY WITH PROPOFOL N/A 07/23/2020   Procedure: COLONOSCOPY WITH PROPOFOL;  Surgeon: Jonathon Bellows, MD;  Location: [facility] ENDOSCOPY;  Service: Gastroenterology;  Laterality: N/A;   NO PAST SURGERIES     Family History  Problem Relation Age of Onset   Stroke Paternal Grandfather    Diabetes Brother    Parkinsonism Maternal Grandfather     Allergies: Jardiance [empagliflozin] and Ciprofloxacin Current Outpatient Medications on File Prior to Visit  Medication Sig Dispense Refill   acetaminophen (TYLENOL) 500 MG tablet Take 500 mg by mouth every 6 (six) hours as needed.     Continuous Blood Gluc Sensor (FREESTYLE LIBRE 3 SENSOR) MISC Use as directed and change every 14 days 2 each 3   glipiZIDE (GLUCOTROL) 5 MG tablet Take 1 tablet (5 mg total) by mouth 2 (two) times daily before a meal. 60 tablet 3   Glucose Blood (BLOOD GLUCOSE TEST STRIPS) STRP True Result test strips, to test blood glucose 3 times daily, use as directed 100 each 5   metFORMIN  (GLUCOPHAGE-XR) 500 MG 24 hr tablet Take 2 tablets (1,000 mg total) by mouth in the morning and at bedtime. 120 tablet 3   TRUEPLUS LANCETS 30G MISC USE AS DIRECTED 100 each 11   No current facility-administered medications on file prior to visit.    Social History   Tobacco Use   Smoking status: Never   Smokeless tobacco: Never  Substance Use Topics   Alcohol use: Yes    Comment: ocassionally   Drug use: No    Review of Systems  Constitutional:  Negative for chills and fever.  Respiratory:  Negative for cough.   Cardiovascular:  Negative for chest pain and palpitations.  Gastrointestinal:  Negative for nausea and vomiting.      Objective:    BP 128/78 (Patient Position: Sitting, Cuff Size: Normal)   Pulse (!) 101   Temp 98.4 F (36.9 C) (Oral)   Ht '5\' 9"'$  (1.753 m)   Wt 144 lb 9.6 oz (65.6 kg)   SpO2 98%   BMI 21.35 kg/m  BP Readings from Last 3 Encounters:  02/03/22 128/78  01/27/22 110/64  12/02/21 116/68   Wt Readings from Last 3 Encounters:  02/03/22 144 lb 9.6 oz (65.6 kg)  01/27/22 145 lb (65.8 kg)  12/02/21 143 lb (64.9 kg)    Physical Exam Vitals reviewed.  Constitutional:  Appearance: He is well-developed.  Cardiovascular:     Rate and Rhythm: Regular rhythm.     Heart sounds: Normal heart sounds.  Pulmonary:     Effort: Pulmonary effort is normal. No respiratory distress.     Breath sounds: Normal breath sounds. No wheezing, rhonchi or rales.  Skin:    General: Skin is warm and dry.  Neurological:     Mental Status: He is alert.  Psychiatric:        Speech: Speech normal.        Behavior: Behavior normal.        Assessment & Plan:   Problem List Items Addressed This Visit       Endocrine   Type 2 diabetes mellitus with hyperglycemia, without long-term current use of insulin ([facility]) - Primary    Lab Results  Component Value Date   HGBA1C 8.3 (A) 33/29/5188  Tried Trulicity, Mounjaro due to GI intolerance. On jardiance , he felt  fatigued.  CGM active 58%.  Average glucose 188, GMI 7.8%.  Very low range 0%, low range 1%.  Target range 46%.  Counseled on risk of hypoglycemia with glipizide.  His preference is to increase glipizide versus starting basal insulin.  Increase glipizide to 10 mg with largest meal of the day in the morning which fits trend of glucose elevating after breakfast.  He will be maintained on glipizide 5 mg with dinner.  Advised him to hold glipizide if skipping dinner or having a low carbohydrate meal as I suspect this is the reason for his previous hypoglycemic episodes.  Continue metformin 2000 mg a day.  He will let me know how he is doing      Relevant Orders   POCT HgB A1C (Completed)     Other   Hyperlipidemia    He politely declines statin therapy at this time to lower ASCVD risk.  We will follow cholesterol annually.        I am having Tami Lin. Hausen maintain his acetaminophen, TRUEplus Lancets 30G, BLOOD GLUCOSE TEST STRIPS, glipiZIDE, FreeStyle Libre 3 Sensor, and metFORMIN.   No orders of the defined types were placed in this encounter.   Return precautions given.   Risks, benefits, and alternatives of the medications and treatment plan prescribed today were discussed, and patient expressed understanding.   Education regarding symptom management and diagnosis given to patient on AVS.  Continue to follow with Burnard Hawthorne, FNP for routine health maintenance.   Jim [name] and I agreed with plan.   Mable Paris, FNP

## 2022-02-03 NOTE — Patient Instructions (Addendum)
Increase glipizide to 4m in the morning with breakfast.  Hold glipizide at dinner if skipping dinner and/or having low carbohydrate meal.  Please let me know of any further hypoglycemic episodes.  Preventing Hypoglycemia Hypoglycemia occurs when the level of sugar (glucose) in the blood is too low. Hypoglycemia can happen in people who do or do not have diabetes (diabetes mellitus). It can develop quickly, and it can be a medical emergency. For most people with diabetes, a blood glucose level below 70 mg/dL (3.9 mmol/L) is considered hypoglycemia. Glucose is a type of sugar that provides the body's main source of energy. Certain hormones (insulin and glucagon) control the level of glucose in the blood. Insulin lowers blood glucose, and glucagon increases blood glucose. Hypoglycemia can result from having too much insulin in the bloodstream, or from not eating enough food that contains glucose. Your risk for hypoglycemia is higher: If you take insulin or diabetes medicines to help lower your blood glucose or to help your body make more insulin. If you skip or delay a meal or snack. If you are ill. During and after exercise. You can prevent hypoglycemia by working with your health care provider to adjust your meal plan as needed and by taking other precautions. How can hypoglycemia affect me? Mild symptoms Mild hypoglycemia may not cause any symptoms. If you do have symptoms, they may include: Hunger. Sweating and feeling clammy. Dizziness or feeling light-headed. Sleepiness or restless sleep. Nausea. Increased heart rate. Headache. Blurry vision. Mood changes, including irritability or anxiety. Tingling or numbness around the mouth, lips, or tongue. If mild hypoglycemia is not recognized and treated, it can quickly become moderate or severe hypoglycemia. Moderate symptoms Moderate hypoglycemia can cause: Confusion and poor judgment. Behavior changes. Weakness. Irregular heartbeat. A  change in coordination. Severe symptoms Severe hypoglycemia is a medical emergency. It can cause: Fainting. Seizures. Loss of consciousness (coma). Death. What nutrition changes can be made? Work with your health care provider or dietitian to make a healthy meal plan that is right for you. Follow your meal plan carefully. Eat meals at regular times. If recommended by your health care provider, have snacks between meals. Donot skip or delay meals or snacks. You can be at risk for hypoglycemia if you are not getting enough carbohydrates. What lifestyle changes can be made?  Work closely with your health care provider to manage your blood glucose. Make sure you know: Your goal blood glucose levels. How and when to check your blood glucose. The symptoms of hypoglycemia. It is important to treat hypoglycemia right away to keep it from becoming severe. Do not drink alcohol on an empty stomach. When you are ill, check your blood glucose more often than usual. Make a sick day plan in advance with your health care provider. Follow this plan whenever you cannot eat or drink normally. Always check your blood glucose before, during, and after exercise. How is this treated? This condition can often be treated by immediately eating or drinking something that contains sugar with 15 grams of fast-acting carbohydrate, such as: 4 oz (120 mL) of fruit juice. 4 oz (120 mL) of regular soda (not diet soda). Several pieces of hard candy. Check food labels to find out how many pieces to eat for 15 grams. 1 Tbsp (15 mL) of sugar or honey. 4 glucose tablets. 1 tube of glucose gel. Treating hypoglycemia if you have diabetes If you are alert and able to swallow safely, follow the 15:15 rule: Take 15 grams of  a fast-acting carbohydrate. Talk with your health care provider about how much you should take. Fast-acting options include: Glucose tablets (take 4 tablets). Several pieces of hard candy. Check food  labels to find out how many pieces to eat for 15 grams. 4 oz (120 mL) of fruit juice. 4 oz (120 mL) of regular soda (not diet soda). 1 Tbsp (15 mL) of sugar or honey. 1 tube of glucose gel. Check your blood glucose 15 minutes after you take the carbohydrate. If the repeat blood glucose level is still at or below 70 mg/dL (3.9 mmol/L), take 15 grams of a carbohydrate again. If your blood glucose level does not increase above 70 mg/dL (3.9 mmol/L) after 3 tries, seek emergency medical care. After your blood glucose level returns to normal, eat a meal or a snack within 1 hour. Treating severe hypoglycemia Severe hypoglycemia is when your blood glucose level is below 54 mg/dL (3 mmol/L). Severe hypoglycemia is a medical emergency. Get medical help right away. If you have severe hypoglycemia and you cannot eat or drink, you may need glucagon. A family member or close friend should learn how to check your blood glucose and how to give you glucagon. Ask your health care provider if you need to have an emergency glucagon kit available. Severe hypoglycemia may need to be treated in a hospital. The treatment may include getting glucose through an IV. You may also need treatment for the cause of your hypoglycemia. Where to find more information American Diabetes Association: www.diabetes.[employer] of Diabetes and Digestive and Kidney Diseases: [employer] Association of Diabetes Care & Education Specialists: www.diabeteseducator.org Contact a health care provider if: You have problems keeping your blood glucose in your target range. You have frequent episodes of hypoglycemia. Get help right away if: You continue to have hypoglycemia symptoms after eating or drinking something containing glucose. Your blood glucose level is below 54 mg/dL (3 mmol/L). You faint. You have a seizure. These symptoms may represent a serious problem that is an emergency. Do not wait to see if the symptoms  will go away. Get medical help right away. Call your local emergency services (911 in the [place] Do not drive yourself to the hospital. Summary Know the symptoms of hypoglycemia and when you are at risk for it, such as during exercise or when you are sick. Check your blood glucose often when you are at risk for hypoglycemia. Hypoglycemia can develop quickly, and it can be dangerous if it is not treated right away. If you have a history of severe hypoglycemia, make sure your family or a close friend knows how to use your glucagon kit. Make sure you know how to treat hypoglycemia. Keep a fast-acting carbohydrate option available when you may be at risk for hypoglycemia. This information is not intended to replace advice given to you by your health care provider. Make sure you discuss any questions you have with your health care provider. Document Revised: 06/03/2020 Document Reviewed: 06/03/2020 Elsevier Patient Education  [place].

## 2022-02-03 NOTE — Assessment & Plan Note (Addendum)
He politely declines statin therapy at this time to lower ASCVD risk.  We will follow cholesterol annually.

## 2022-02-06 ENCOUNTER — Other Ambulatory Visit (HOSPITAL_COMMUNITY): Payer: Self-pay

## 2022-03-03 ENCOUNTER — Other Ambulatory Visit (HOSPITAL_COMMUNITY): Payer: Self-pay

## 2022-03-10 ENCOUNTER — Ambulatory Visit: Payer: 59 | Admitting: Family Medicine

## 2022-03-13 NOTE — Progress Notes (Unsigned)
Jim [name] [address] [place] Phone: [phone] Subjective:   [name], am serving as a scribe for Dr. Hulan Saas.  I'm seeing this patient by the request  of:  Burnard Hawthorne, FNP  CC: Back and neck pain follow-up  Jim [name]  Jim [name] is a 56 y.o. male coming in with complaint of back and neck pain. OMT 01/27/2022. Patient states doing slightly better in the hips. When doing exercises they are helping. No new issues.  Medications patient has been prescribed: None  Taking:         Reviewed prior external information including notes and imaging from previsou exam, outside providers and external EMR if available.   As well as notes that were available from care everywhere and other healthcare systems.  Past medical history, social, surgical and family history all reviewed in electronic medical record.  No pertanent information unless stated regarding to the chief complaint.   Past Medical History:  Diagnosis Date   Diabetes mellitus 2012   GERD (gastroesophageal reflux disease)     Allergies  Allergen Reactions   Jardiance [Empagliflozin]     Fatigue, weight loss   Ciprofloxacin Rash     Review of Systems:  No headache, visual changes, nausea, vomiting, diarrhea, constipation, dizziness, abdominal pain, skin rash, fevers, chills, night sweats, weight loss, swollen lymph nodes, body aches, joint swelling, chest pain, shortness of breath, mood changes. POSITIVE muscle aches  Objective  Blood pressure 114/74, pulse (!) 106, height '5\' 9"'$  (1.753 m), weight 145 lb (65.8 kg), SpO2 98 %.   General: No apparent distress alert and oriented x3 mood and affect normal, dressed appropriately.  HEENT: Pupils equal, extraocular movements intact  Respiratory: Patient's speak in full sentences and does not appear short of breath  Cardiovascular: No lower extremity edema, non tender, no erythema  Gait normal   MSK:  Back does have tightness noted in the left parascapular region.  Also some more discomfort actually noted in the left side of the sacroiliac joint than usual.  More positive Corky Sox left greater than right.  Negative straight leg test  Osteopathic findings  C2 flexed rotated and side bent right C6 flexed rotated and side bent left T3 extended rotated and side bent left inhaled rib L3 flexed rotated and side bent right Sacrum left on left       Assessment and Plan:  Lumbar radiculopathy Chronic problem with mild exacerbation.  Noted to have more tightness though noted to the left sacroiliac joint.  No true radicular symptoms today which is improvement.  Discussed which activities to do and which ones to avoid.  Increase activity slowly.  Follow-up again in 6 to 8 weeks.    Nonallopathic problems  Decision today to treat with OMT was based on Physical Exam  After verbal consent patient was treated with HVLA, ME, FPR techniques in cervical, rib, thoracic, lumbar, and sacral  areas  Patient tolerated the procedure well with improvement in symptoms  Patient given exercises, stretches and lifestyle modifications  See medications in patient instructions if given  Patient will follow up in 4-8 weeks    The above documentation has been reviewed and is accurate and complete Lyndal Pulley, DO          Note: This dictation was prepared with Dragon dictation along with smaller phrase technology. Any transcriptional errors that result from this process are unintentional.

## 2022-03-14 ENCOUNTER — Other Ambulatory Visit (HOSPITAL_COMMUNITY): Payer: Self-pay

## 2022-03-14 ENCOUNTER — Ambulatory Visit: Payer: 59 | Admitting: Family Medicine

## 2022-03-14 ENCOUNTER — Other Ambulatory Visit: Payer: Self-pay | Admitting: Family

## 2022-03-14 VITALS — BP 114/74 | HR 106 | Ht 69.0 in | Wt 145.0 lb

## 2022-03-14 DIAGNOSIS — M9901 Segmental and somatic dysfunction of cervical region: Secondary | ICD-10-CM | POA: Diagnosis not present

## 2022-03-14 DIAGNOSIS — M9902 Segmental and somatic dysfunction of thoracic region: Secondary | ICD-10-CM

## 2022-03-14 DIAGNOSIS — M9904 Segmental and somatic dysfunction of sacral region: Secondary | ICD-10-CM

## 2022-03-14 DIAGNOSIS — M9903 Segmental and somatic dysfunction of lumbar region: Secondary | ICD-10-CM | POA: Diagnosis not present

## 2022-03-14 DIAGNOSIS — M9908 Segmental and somatic dysfunction of rib cage: Secondary | ICD-10-CM

## 2022-03-14 DIAGNOSIS — E1165 Type 2 diabetes mellitus with hyperglycemia: Secondary | ICD-10-CM

## 2022-03-14 DIAGNOSIS — M5416 Radiculopathy, lumbar region: Secondary | ICD-10-CM | POA: Diagnosis not present

## 2022-03-14 MED ORDER — GLIPIZIDE 5 MG PO TABS
5.0000 mg | ORAL_TABLET | Freq: Two times a day (BID) | ORAL | 3 refills | Status: DC
  Filled 2022-03-14: qty 60, 30d supply, fill #0
  Filled 2022-04-13: qty 60, 30d supply, fill #1

## 2022-03-14 NOTE — Assessment & Plan Note (Signed)
Chronic problem with mild exacerbation.  Noted to have more tightness though noted to the left sacroiliac joint.  No true radicular symptoms today which is improvement.  Discussed which activities to do and which ones to avoid.  Increase activity slowly.  Follow-up again in 6 to 8 weeks.

## 2022-03-14 NOTE — Patient Instructions (Signed)
Tell our better half hi Overall not too shabby

## 2022-03-16 ENCOUNTER — Other Ambulatory Visit (HOSPITAL_COMMUNITY): Payer: Self-pay

## 2022-03-17 ENCOUNTER — Other Ambulatory Visit (HOSPITAL_COMMUNITY): Payer: Self-pay

## 2022-03-22 ENCOUNTER — Other Ambulatory Visit (HOSPITAL_COMMUNITY): Payer: Self-pay

## 2022-04-14 ENCOUNTER — Other Ambulatory Visit (HOSPITAL_COMMUNITY): Payer: Self-pay

## 2022-05-05 ENCOUNTER — Ambulatory Visit: Payer: 59 | Admitting: Family Medicine

## 2022-05-12 ENCOUNTER — Other Ambulatory Visit (HOSPITAL_COMMUNITY): Payer: Self-pay

## 2022-05-12 ENCOUNTER — Ambulatory Visit: Payer: 59 | Admitting: Family

## 2022-05-12 ENCOUNTER — Encounter: Payer: Self-pay | Admitting: Family

## 2022-05-12 VITALS — BP 116/80 | HR 82 | Temp 97.9°F | Ht 69.0 in | Wt 146.6 lb

## 2022-05-12 DIAGNOSIS — E1165 Type 2 diabetes mellitus with hyperglycemia: Secondary | ICD-10-CM | POA: Diagnosis not present

## 2022-05-12 LAB — POCT GLYCOSYLATED HEMOGLOBIN (HGB A1C): Hemoglobin A1C: 8.6 % — AB (ref 4.0–5.6)

## 2022-05-12 MED ORDER — GLIPIZIDE 5 MG PO TABS
10.0000 mg | ORAL_TABLET | Freq: Two times a day (BID) | ORAL | 3 refills | Status: DC
  Filled 2022-05-12: qty 360, 90d supply, fill #0
  Filled 2022-08-03: qty 360, 90d supply, fill #1
  Filled 2022-10-31: qty 360, 90d supply, fill #2
  Filled 2023-02-05: qty 360, 90d supply, fill #3

## 2022-05-12 MED ORDER — METFORMIN HCL ER 500 MG PO TB24
1000.0000 mg | ORAL_TABLET | Freq: Two times a day (BID) | ORAL | 3 refills | Status: DC
  Filled 2022-05-12: qty 120, 30d supply, fill #0
  Filled 2022-06-13: qty 120, 30d supply, fill #1
  Filled 2022-07-07: qty 120, 30d supply, fill #2
  Filled 2022-08-14: qty 120, 30d supply, fill #3

## 2022-05-12 NOTE — Progress Notes (Signed)
Subjective:    Patient ID: Jim [name], male    DOB: 1965/10/03, 56 y.o.   MRN: [id number]  CC: [name] is a 56 y.o. male who presents today for follow up.   HPI: Feels well today.  No new complaints   He is feeling much better. Fatigue has resolved. Appetite is good.   Compliant with glipizide '5mg'$  BID WM, metformin '2000mg'$  qd.  He has previously been on glipizide 10 mg and tolerated well.  He is interested in increasing glipizide again He is using CGM. FBG 130 on average No further hypoglycemic episodes.   HISTORY:  Past Medical History:  Diagnosis Date   Diabetes mellitus 2012   GERD (gastroesophageal reflux disease)    Past Surgical History:  Procedure Laterality Date   CLOSED REDUCTION ANKLE FRACTURE     COLONOSCOPY WITH PROPOFOL N/A 07/23/2020   Procedure: COLONOSCOPY WITH PROPOFOL;  Surgeon: Jonathon Bellows, MD;  Location: [facility] ENDOSCOPY;  Service: Gastroenterology;  Laterality: N/A;   NO PAST SURGERIES     Family History  Problem Relation Age of Onset   Stroke Paternal Grandfather    Diabetes Brother    Parkinsonism Maternal Grandfather     Allergies: Jardiance [empagliflozin] and Ciprofloxacin Current Outpatient Medications on File Prior to Visit  Medication Sig Dispense Refill   acetaminophen (TYLENOL) 500 MG tablet Take 500 mg by mouth every 6 (six) hours as needed.     Continuous Blood Gluc Sensor (FREESTYLE LIBRE 3 SENSOR) MISC Use as directed and change every 14 days 2 each 3   Glucose Blood (BLOOD GLUCOSE TEST STRIPS) STRP True Result test strips, to test blood glucose 3 times daily, use as directed 100 each 5   TRUEPLUS LANCETS 30G MISC USE AS DIRECTED 100 each 11   No current facility-administered medications on file prior to visit.    Social History   Tobacco Use   Smoking status: Never   Smokeless tobacco: Never  Substance Use Topics   Alcohol use: Yes    Comment: ocassionally   Drug use: No    Review of Systems  Constitutional:   Negative for chills and fever.  Respiratory:  Negative for cough.   Cardiovascular:  Negative for chest pain and palpitations.  Gastrointestinal:  Negative for nausea and vomiting.      Objective:    BP 116/80 (BP Location: Left Arm, Patient Position: Sitting, Cuff Size: Normal)   Pulse 82   Temp 97.9 F (36.6 C) (Oral)   Ht '5\' 9"'$  (1.753 m)   Wt 146 lb 9.6 oz (66.5 kg)   SpO2 98%   BMI 21.65 kg/m  BP Readings from Last 3 Encounters:  05/12/22 116/80  03/14/22 114/74  02/03/22 128/78   Wt Readings from Last 3 Encounters:  05/12/22 146 lb 9.6 oz (66.5 kg)  03/14/22 145 lb (65.8 kg)  02/03/22 144 lb 9.6 oz (65.6 kg)    Physical Exam Vitals reviewed.  Constitutional:      Appearance: He is well-developed.  Cardiovascular:     Rate and Rhythm: Regular rhythm.     Heart sounds: Normal heart sounds.  Pulmonary:     Effort: Pulmonary effort is normal. No respiratory distress.     Breath sounds: Normal breath sounds. No wheezing, rhonchi or rales.  Skin:    General: Skin is warm and dry.  Neurological:     Mental Status: He is alert.  Psychiatric:        Speech: Speech normal.  Behavior: Behavior normal.        Assessment & Plan:   Problem List Items Addressed This Visit       Endocrine   Type 2 diabetes mellitus with hyperglycemia, without long-term current use of insulin ([place]) - Primary    Increased from prior.  We agreed to increase glipizide from 5 mg twice daily to 10 mg twice daily with meals.  He will continue metformin 2000 mg daily.  He will monitor for hypoglycemic episode.  Advised to start increase of glipizide by taking glipizide 10 mg with breakfast and continue 5 mg with supper.  If he tolerates that after a week or so, he may then increase to 10 mg twice daily.  He is aware of this.      Relevant Medications   glipiZIDE (GLUCOTROL) 5 MG tablet   metFORMIN (GLUCOPHAGE-XR) 500 MG 24 hr tablet   Other Relevant Orders   POCT HgB A1C     I  have changed Joneen Caraway Q. Hamrick's glipiZIDE. I am also having him maintain his acetaminophen, TRUEplus Lancets 30G, BLOOD GLUCOSE TEST STRIPS, FreeStyle Libre 3 Sensor, and metFORMIN.   Meds ordered this encounter  Medications   glipiZIDE (GLUCOTROL) 5 MG tablet    Sig: Take 2 tablets (10 mg total) by mouth 2 (two) times daily before a meal.    Dispense:  360 tablet    Refill:  3    Order Specific Question:   Supervising Provider    Answer:   Crecencio Mc [[id number]]   metFORMIN (GLUCOPHAGE-XR) 500 MG 24 hr tablet    Sig: Take 2 tablets (1,000 mg total) by mouth in the morning and at bedtime.    Dispense:  120 tablet    Refill:  3    Order Specific Question:   Supervising Provider    Answer:   Crecencio Mc [[id number]]    Return precautions given.   Risks, benefits, and alternatives of the medications and treatment plan prescribed today were discussed, and patient expressed understanding.   Education regarding symptom management and diagnosis given to patient on AVS.  Continue to follow with Burnard Hawthorne, FNP for routine health maintenance.   Jim [name] and I agreed with plan.   Mable Paris, FNP

## 2022-05-12 NOTE — Progress Notes (Signed)
Would like to increase glipizide

## 2022-05-12 NOTE — Patient Instructions (Addendum)
Increase glipizide to '10mg'$  twice daily with largest meals.  You must take with meal to avoid low blood sugar  To start, take glipizide '10mg'$  in the morning with breakfast and '5mg'$  with dinner. If tolerate, and no low blood sugars, then after several days, increase to '10mg'$  with breakfast and with dinner.

## 2022-05-12 NOTE — Assessment & Plan Note (Signed)
Increased from prior.  We agreed to increase glipizide from 5 mg twice daily to 10 mg twice daily with meals.  He will continue metformin 2000 mg daily.  He will monitor for hypoglycemic episode.  Advised to start increase of glipizide by taking glipizide 10 mg with breakfast and continue 5 mg with supper.  If he tolerates that after a week or so, he may then increase to 10 mg twice daily.  He is aware of this.

## 2022-05-15 ENCOUNTER — Other Ambulatory Visit: Payer: Self-pay | Admitting: Family

## 2022-05-15 ENCOUNTER — Other Ambulatory Visit (HOSPITAL_COMMUNITY): Payer: Self-pay

## 2022-05-16 ENCOUNTER — Other Ambulatory Visit (HOSPITAL_COMMUNITY): Payer: Self-pay

## 2022-05-16 MED ORDER — FREESTYLE LIBRE 3 SENSOR MISC
1.0000 | 3 refills | Status: DC
  Filled 2022-05-16: qty 2, 28d supply, fill #0
  Filled 2022-08-05: qty 2, 28d supply, fill #1
  Filled 2022-09-11: qty 2, 28d supply, fill #2
  Filled 2022-10-31: qty 2, 28d supply, fill #3

## 2022-05-31 NOTE — Progress Notes (Signed)
  [place] Phone: [phone] Subjective:   [name], am serving as a scribe for Dr. Hulan Saas.  I'm seeing this patient by the request  of:  Burnard Hawthorne, FNP  CC: Neck and back pain  EQA:[id number]  VYRON FRONCZAK is a 56 y.o. male coming in with complaint of back and neck pain. OMT 03/14/2022. Patient states that he has some tightness but nothing severe.  Patient has been happy active otherwise.  Medications patient has been prescribed: None  Taking:         Reviewed prior external information including notes and imaging from previsou exam, outside providers and external EMR if available.   As well as notes that were available from care everywhere and other healthcare systems.  Past medical history, social, surgical and family history all reviewed in electronic medical record.  No pertanent information unless stated regarding to the chief complaint.   Past Medical History:  Diagnosis Date   Diabetes mellitus 2012   GERD (gastroesophageal reflux disease)     Allergies  Allergen Reactions   Jardiance [Empagliflozin]     Fatigue, weight loss   Ciprofloxacin Rash     Review of Systems:  No headache, visual changes, nausea, vomiting, diarrhea, constipation, dizziness, abdominal pain, skin rash, fevers, chills, night sweats, weight loss, swollen lymph nodes, body aches, joint swelling, chest pain, shortness of breath, mood changes. POSITIVE muscle aches  Objective  Blood pressure 112/74, pulse 97, height '5\' 9"'$  (1.753 m), weight 145 lb (65.8 kg), SpO2 97 %.   General: No apparent distress alert and oriented x3 mood and affect normal, dressed appropriately.  HEENT: Pupils equal, extraocular movements intact  Respiratory: Patient's speak in full sentences and does not appear short of breath  Cardiovascular: No lower extremity edema, non tender, no erythema  More tightness in the  right parascapular area.  Previously.  Neck exam also has some limited sidebending bilaterally.  Right-sided lower back tightness with sidebending and rotation.  Osteopathic findings  C2 flexed rotated and side bent right C6 flexed rotated and side bent left T3 extended rotated and side bent right inhaled rib T9 extended rotated and side bent right L3 flexed rotated and side bent right Sacrum right on right       Assessment and Plan:  Lumbar radiculopathy Some mild tightness on the right side.  Has not been doing anything specific though.  Nothing noted to stop him from activity.  Responds well though to conservative therapy.  Discussed continuing to work on core strengthening.  Follow-up with me again in 6 weeks.    Nonallopathic problems  Decision today to treat with OMT was based on Physical Exam  After verbal consent patient was treated with HVLA, ME, FPR techniques in cervical, rib, thoracic, lumbar, and sacral  areas  Patient tolerated the procedure well with improvement in symptoms  Patient given exercises, stretches and lifestyle modifications  See medications in patient instructions if given  Patient will follow up in 4-8 weeks    The above documentation has been reviewed and is accurate and complete Lyndal Pulley, DO          Note: This dictation was prepared with Dragon dictation along with smaller phrase technology. Any transcriptional errors that result from this process are unintentional.

## 2022-06-02 ENCOUNTER — Ambulatory Visit: Payer: 59 | Admitting: Family Medicine

## 2022-06-02 VITALS — BP 112/74 | HR 97 | Ht 69.0 in | Wt 145.0 lb

## 2022-06-02 DIAGNOSIS — M5416 Radiculopathy, lumbar region: Secondary | ICD-10-CM

## 2022-06-02 DIAGNOSIS — M9904 Segmental and somatic dysfunction of sacral region: Secondary | ICD-10-CM

## 2022-06-02 DIAGNOSIS — M9901 Segmental and somatic dysfunction of cervical region: Secondary | ICD-10-CM | POA: Diagnosis not present

## 2022-06-02 DIAGNOSIS — M9908 Segmental and somatic dysfunction of rib cage: Secondary | ICD-10-CM | POA: Diagnosis not present

## 2022-06-02 DIAGNOSIS — M9903 Segmental and somatic dysfunction of lumbar region: Secondary | ICD-10-CM

## 2022-06-02 DIAGNOSIS — M9902 Segmental and somatic dysfunction of thoracic region: Secondary | ICD-10-CM

## 2022-06-02 NOTE — Patient Instructions (Signed)
Good to see you Let's not change much Have fun on cruise See me in 8 weeks

## 2022-06-02 NOTE — Assessment & Plan Note (Signed)
Some mild tightness on the right side.  Has not been doing anything specific though.  Nothing noted to stop him from activity.  Responds well though to conservative therapy.  Discussed continuing to work on core strengthening.  Follow-up with me again in 6 weeks.

## 2022-06-12 ENCOUNTER — Encounter: Payer: Self-pay | Admitting: Family

## 2022-06-12 NOTE — Telephone Encounter (Signed)
Spoke to pt and scheduled him an appt to come into the office on 06/14/22

## 2022-06-13 ENCOUNTER — Other Ambulatory Visit (HOSPITAL_COMMUNITY): Payer: Self-pay

## 2022-06-14 ENCOUNTER — Other Ambulatory Visit (HOSPITAL_COMMUNITY): Payer: Self-pay

## 2022-06-14 ENCOUNTER — Encounter: Payer: Self-pay | Admitting: Family

## 2022-06-14 ENCOUNTER — Ambulatory Visit: Payer: 59 | Admitting: Family

## 2022-06-14 VITALS — BP 128/76 | HR 101 | Temp 97.9°F | Wt 145.8 lb

## 2022-06-14 DIAGNOSIS — J0100 Acute maxillary sinusitis, unspecified: Secondary | ICD-10-CM

## 2022-06-14 MED ORDER — AMOXICILLIN-POT CLAVULANATE 875-125 MG PO TABS
1.0000 | ORAL_TABLET | Freq: Two times a day (BID) | ORAL | 0 refills | Status: AC
  Filled 2022-06-14: qty 14, 7d supply, fill #0

## 2022-06-14 NOTE — Patient Instructions (Signed)
Start Augmentin, antibiotic. Ensure to take probiotics while on antibiotics and also for 2 weeks after completion. This can either be by eating yogurt daily or taking a probiotic supplement over the counter such as Culturelle.It is important to re-colonize the gut with good bacteria and also to prevent any diarrheal infections associated with antibiotic use.  Please let me know if you dont start feeling better.   Sinus Infection, Adult A sinus infection, also called sinusitis, is inflammation of your sinuses. Sinuses are hollow spaces in the bones around your face. Your sinuses are located: Around your eyes. In the middle of your forehead. Behind your nose. In your cheekbones. Mucus normally drains out of your sinuses. When your nasal tissues become inflamed or swollen, mucus can become trapped or blocked. This allows bacteria, viruses, and fungi to grow, which leads to infection. Most infections of the sinuses are caused by a virus. A sinus infection can develop quickly. It can last for up to 4 weeks (acute) or for more than 12 weeks (chronic). A sinus infection often develops after a cold. What are the causes? This condition is caused by anything that creates swelling in the sinuses or stops mucus from draining. This includes: Allergies. Asthma. Infection from bacteria or viruses. Deformities or blockages in your nose or sinuses. Abnormal growths in the nose (nasal polyps). Pollutants, such as chemicals or irritants in the air. Infection from fungi. This is rare. What increases the risk? You are more likely to develop this condition if you: Have a weak body defense system (immune system). Do a lot of swimming or diving. Overuse nasal sprays. Smoke. What are the signs or symptoms? The main symptoms of this condition are pain and a feeling of pressure around the affected sinuses. Other symptoms include: Stuffy nose or congestion that makes it difficult to breathe through your  nose. Thick yellow or greenish drainage from your nose. Tenderness, swelling, and warmth over the affected sinuses. A cough that may get worse at night. Decreased sense of smell and taste. Extra mucus that collects in the throat or the back of the nose (postnasal drip) causing a sore throat or bad breath. Tiredness (fatigue). Fever. How is this diagnosed? This condition is diagnosed based on: Your symptoms. Your medical history. A physical exam. Tests to find out if your condition is acute or chronic. This may include: Checking your nose for nasal polyps. Viewing your sinuses using a device that has a light (endoscope). Testing for allergies or bacteria. Imaging tests, such as an MRI or CT scan. In rare cases, a bone biopsy may be done to rule out more serious types of fungal sinus disease. How is this treated? Treatment for a sinus infection depends on the cause and whether your condition is chronic or acute. If caused by a virus, your symptoms should go away on their own within 10 days. You may be given medicines to relieve symptoms. They include: Medicines that shrink swollen nasal passages (decongestants). A spray that eases inflammation of the nostrils (topical intranasal corticosteroids). Rinses that help get rid of thick mucus in your nose (nasal saline washes). Medicines that treat allergies (antihistamines). Over-the-counter pain relievers. If caused by bacteria, your health care provider may recommend waiting to see if your symptoms improve. Most bacterial infections will get better without antibiotic medicine. You may be given antibiotics if you have: A severe infection. A weak immune system. If caused by narrow nasal passages or nasal polyps, surgery may be needed. Follow these instructions at  home: Medicines Take, use, or apply over-the-counter and prescription medicines only as told by your health care provider. These may include nasal sprays. If you were prescribed an  antibiotic medicine, take it as told by your health care provider. Do not stop taking the antibiotic even if you start to feel better. Hydrate and humidify  Drink enough fluid to keep your urine pale yellow. Staying hydrated will help to thin your mucus. Use a cool mist humidifier to keep the humidity level in your home above 50%. Inhale steam for 10-15 minutes, 3-4 times a day, or as told by your health care provider. You can do this in the bathroom while a hot shower is running. Limit your exposure to cool or dry air. Rest Rest as much as possible. Sleep with your head raised (elevated). Make sure you get enough sleep each night. General instructions  Apply a warm, moist washcloth to your face 3-4 times a day or as told by your health care provider. This will help with discomfort. Use nasal saline washes as often as told by your health care provider. Wash your hands often with soap and water to reduce your exposure to germs. If soap and water are not available, use hand sanitizer. Do not smoke. Avoid being around people who are smoking (secondhand smoke). Keep all follow-up visits. This is important. Contact a health care provider if: You have a fever. Your symptoms get worse. Your symptoms do not improve within 10 days. Get help right away if: You have a severe headache. You have persistent vomiting. You have severe pain or swelling around your face or eyes. You have vision problems. You develop confusion. Your neck is stiff. You have trouble breathing. These symptoms may be an emergency. Get help right away. Call 911. Do not wait to see if the symptoms will go away. Do not drive yourself to the hospital. Summary A sinus infection is soreness and inflammation of your sinuses. Sinuses are hollow spaces in the bones around your face. This condition is caused by nasal tissues that become inflamed or swollen. The swelling traps or blocks the flow of mucus. This allows bacteria,  viruses, and fungi to grow, which leads to infection. If you were prescribed an antibiotic medicine, take it as told by your health care provider. Do not stop taking the antibiotic even if you start to feel better. Keep all follow-up visits. This is important. This information is not intended to replace advice given to you by your health care provider. Make sure you discuss any questions you have with your health care provider. Document Revised: 06/07/2021 Document Reviewed: 06/07/2021 Elsevier Patient Education  [place].

## 2022-06-14 NOTE — Assessment & Plan Note (Signed)
Afebrile.  No acute respiratory distress.  Duration symptoms 9 days.  Concern for bacterial sinusitis.  Start Augmentin.  He understands  to take probiotics.  He will let me know how he is doing

## 2022-06-14 NOTE — Progress Notes (Signed)
Subjective:    Patient ID: Jim [name], male    DOB: [birth year], 56 y.o.   MRN: [id number]  CC: TIDUS UPCHURCH is a 56 y.o. male who presents today for an acute visit.    HPI: Acute visit for concern for sinus congestion. He has congestion for 4 weeks.  Sinus congestion improved slightly over the past couple of days and then 9  days ago, he developed sinus HA.   Endorses sinus headache, sinus pressure, and ear pressure with popping ears.   No fever, cough, sob, vision changes.   He has been taking ibuprofen, OTC daytime cold sinus with some relief.      Reports he is prone to sinus infections. He has seen ENT in the past.   HISTORY:  Past Medical History:  Diagnosis Date   Diabetes mellitus 2012   GERD (gastroesophageal reflux disease)    Past Surgical History:  Procedure Laterality Date   CLOSED REDUCTION ANKLE FRACTURE     COLONOSCOPY WITH PROPOFOL N/A 07/23/2020   Procedure: COLONOSCOPY WITH PROPOFOL;  Surgeon: Jonathon Bellows, MD;  Location: [facility] ENDOSCOPY;  Service: Gastroenterology;  Laterality: N/A;   NO PAST SURGERIES     Family History  Problem Relation Age of Onset   Stroke Paternal Grandfather    Diabetes Brother    Parkinsonism Maternal Grandfather     Allergies: Jardiance [empagliflozin] and Ciprofloxacin Current Outpatient Medications on File Prior to Visit  Medication Sig Dispense Refill   acetaminophen (TYLENOL) 500 MG tablet Take 500 mg by mouth every 6 (six) hours as needed.     Continuous Blood Gluc Sensor (FREESTYLE LIBRE 3 SENSOR) MISC Use as directed and change every 14 days 2 each 3   glipiZIDE (GLUCOTROL) 5 MG tablet Take 2 tablets (10 mg total) by mouth 2 (two) times daily before a meal. 360 tablet 3   metFORMIN (GLUCOPHAGE-XR) 500 MG 24 hr tablet Take 2 tablets (1,000 mg total) by mouth in the morning and at bedtime. 120 tablet 3   Glucose Blood (BLOOD GLUCOSE TEST STRIPS) STRP True Result test strips, to test blood glucose 3 times daily, use  as directed (Patient not taking: Reported on 06/14/2022) 100 each 5   TRUEPLUS LANCETS 30G MISC USE AS DIRECTED (Patient not taking: Reported on 06/14/2022) 100 each 11   No current facility-administered medications on file prior to visit.    Social History   Tobacco Use   Smoking status: Never   Smokeless tobacco: Never  Substance Use Topics   Alcohol use: Yes    Comment: ocassionally   Drug use: No    Review of Systems  Constitutional:  Negative for chills and fever.  HENT:  Positive for congestion, ear pain and sinus pain.   Eyes:  Negative for visual disturbance.  Respiratory:  Negative for cough, shortness of breath and wheezing.   Cardiovascular:  Negative for chest pain and palpitations.  Gastrointestinal:  Negative for nausea and vomiting.  Neurological:  Positive for headaches.      Objective:    BP 128/76   Pulse (!) 101   Temp 97.9 F (36.6 C) (Oral)   Wt 145 lb 12.8 oz (66.1 kg)   SpO2 98%   BMI 21.53 kg/m    Physical Exam Vitals reviewed.  Constitutional:      Appearance: He is well-developed.  HENT:     Head: Normocephalic and atraumatic.     Right Ear: Hearing, ear canal and external ear normal. No decreased hearing  noted. No drainage, swelling or tenderness. No middle ear effusion. Tympanic membrane is bulging. Tympanic membrane is not injected or erythematous.     Left Ear: Hearing, ear canal and external ear normal. No decreased hearing noted. No drainage, swelling or tenderness.  No middle ear effusion. Tympanic membrane is bulging. Tympanic membrane is not injected or erythematous.     Nose: Nose normal.     Right Sinus: No maxillary sinus tenderness or frontal sinus tenderness.     Left Sinus: No maxillary sinus tenderness or frontal sinus tenderness.     Mouth/Throat:     Pharynx: Uvula midline. No oropharyngeal exudate or posterior oropharyngeal erythema.     Tonsils: No tonsillar abscesses.  Eyes:     Conjunctiva/sclera: Conjunctivae  normal.  Cardiovascular:     Rate and Rhythm: Regular rhythm.     Heart sounds: Normal heart sounds.  Pulmonary:     Effort: Pulmonary effort is normal. No respiratory distress.     Breath sounds: Normal breath sounds. No wheezing, rhonchi or rales.  Lymphadenopathy:     Head:     Right side of head: No submental, submandibular, tonsillar, preauricular, posterior auricular or occipital adenopathy.     Left side of head: No submental, submandibular, tonsillar, preauricular, posterior auricular or occipital adenopathy.     Cervical: No cervical adenopathy.  Skin:    General: Skin is warm and dry.  Neurological:     Mental Status: He is alert.  Psychiatric:        Speech: Speech normal.        Behavior: Behavior normal.        Assessment & Plan:   Problem List Items Addressed This Visit       Respiratory   Sinusitis - Primary    Afebrile.  No acute respiratory distress.  Duration symptoms 9 days.  Concern for bacterial sinusitis.  Start Augmentin.  He understands  to take probiotics.  He will let me know how he is doing      Relevant Medications   amoxicillin-clavulanate (AUGMENTIN) 875-125 MG tablet      I am having Tami Lin. Dodgen start on amoxicillin-clavulanate. I am also having him maintain his acetaminophen, TRUEplus Lancets 30G, BLOOD GLUCOSE TEST STRIPS, glipiZIDE, metFORMIN, and FreeStyle Libre 3 Sensor.   Meds ordered this encounter  Medications   amoxicillin-clavulanate (AUGMENTIN) 875-125 MG tablet    Sig: Take 1 tablet by mouth 2 (two) times daily for 7 days.    Dispense:  14 tablet    Refill:  0    Order Specific Question:   Supervising Provider    Answer:   Crecencio Mc [[id number]]    Return precautions given.   Risks, benefits, and alternatives of the medications and treatment plan prescribed today were discussed, and patient expressed understanding.   Education regarding symptom management and diagnosis given to patient on AVS.  Continue to  follow with Burnard Hawthorne, FNP for routine health maintenance.   Jim [name] and I agreed with plan.   Mable Paris, FNP

## 2022-07-07 ENCOUNTER — Other Ambulatory Visit (HOSPITAL_COMMUNITY): Payer: Self-pay

## 2022-08-08 ENCOUNTER — Other Ambulatory Visit: Payer: Self-pay

## 2022-08-11 ENCOUNTER — Ambulatory Visit: Payer: Self-pay | Admitting: Family Medicine

## 2022-08-11 NOTE — Progress Notes (Unsigned)
  Jim [name] [address] [place] Phone: [phone] Subjective:   [name], am serving as a scribe for Dr. Hulan Saas.  I'm seeing this patient by the request  of:  Burnard Hawthorne, FNP  CC: Back and neck pain follow-up  [id number]  HEZZIE KARIM is a 57 y.o. male coming in with complaint of back and neck pain. OMT 06/02/2022. Patient states doing well since last visit. Here for manipulation. No new complaints.  Medications patient has been prescribed: None  Taking:         Reviewed prior external information including notes and imaging from previsou exam, outside providers and external EMR if available.   As well as notes that were available from care everywhere and other healthcare systems.  Past medical history, social, surgical and family history all reviewed in electronic medical record.  No pertanent information unless stated regarding to the chief complaint.   Past Medical History:  Diagnosis Date   Diabetes mellitus 2012   GERD (gastroesophageal reflux disease)     Allergies  Allergen Reactions   Jardiance [Empagliflozin]     Fatigue, weight loss   Ciprofloxacin Rash     Review of Systems:  No headache, visual changes, nausea, vomiting, diarrhea, constipation, dizziness, abdominal pain, skin rash, fevers, chills, night sweats, weight loss, swollen lymph nodes, body aches, joint swelling, chest pain, shortness of breath, mood changes. POSITIVE muscle aches  Objective  Blood pressure 110/80, pulse (!) 106, height '5\' 9"'$  (1.753 m), weight 146 lb (66.2 kg), SpO2 98 %.   General: No apparent distress alert and oriented x3 mood and affect normal, dressed appropriately.  HEENT: Pupils equal, extraocular movements intact  Respiratory: Patient's speak in full sentences and does not appear short of breath  Cardiovascular: No lower extremity edema, non tender, no erythema  Neck exam does have some mild  loss of lordosis. Palpation in the paraspinal musculature.  Patient does have some tightness noted about the sacroiliac joint again.  Likely no radicular symptoms with straight leg test.  Osteopathic findings  C4 flexed rotated and side bent right C6 flexed rotated and side bent right T3 extended rotated and side bent right inhaled rib T9 extended rotated and side bent left L2 flexed rotated and side bent right Sacrum right on right       Assessment and Plan:  Lumbar radiculopathy Patient has been doing very well with conservative therapy at this time.  Discussed icing regimen and home exercises.  Discussed which activities to do and which ones to avoid.  Increase activity slowly.  Follow-up again in 6 to 8 weeks.  Patient is doing better with the control of his diabetes as well recently.    Nonallopathic problems  Decision today to treat with OMT was based on Physical Exam  After verbal consent patient was treated with HVLA, ME, FPR techniques in cervical, rib, thoracic, lumbar, and sacral  areas  Patient tolerated the procedure well with improvement in symptoms  Patient given exercises, stretches and lifestyle modifications  See medications in patient instructions if given  Patient will follow up in 4-8 weeks     The above documentation has been reviewed and is accurate and complete Lyndal Pulley, DO         Note: This dictation was prepared with Dragon dictation along with smaller phrase technology. Any transcriptional errors that result from this process are unintentional.

## 2022-08-15 ENCOUNTER — Other Ambulatory Visit (HOSPITAL_COMMUNITY): Payer: Self-pay

## 2022-08-15 ENCOUNTER — Ambulatory Visit: Payer: Commercial Managed Care - PPO | Admitting: Family Medicine

## 2022-08-15 VITALS — BP 110/80 | HR 106 | Ht 69.0 in | Wt 146.0 lb

## 2022-08-15 DIAGNOSIS — M9904 Segmental and somatic dysfunction of sacral region: Secondary | ICD-10-CM

## 2022-08-15 DIAGNOSIS — M9908 Segmental and somatic dysfunction of rib cage: Secondary | ICD-10-CM

## 2022-08-15 DIAGNOSIS — M9901 Segmental and somatic dysfunction of cervical region: Secondary | ICD-10-CM | POA: Diagnosis not present

## 2022-08-15 DIAGNOSIS — M5416 Radiculopathy, lumbar region: Secondary | ICD-10-CM

## 2022-08-15 DIAGNOSIS — M9902 Segmental and somatic dysfunction of thoracic region: Secondary | ICD-10-CM | POA: Diagnosis not present

## 2022-08-15 DIAGNOSIS — M9903 Segmental and somatic dysfunction of lumbar region: Secondary | ICD-10-CM | POA: Diagnosis not present

## 2022-08-15 NOTE — Patient Instructions (Signed)
Good to see you! Tell your better half hi and glad she's doing okay Otherwise you're doing fantastic See you again in 6-8 weeks

## 2022-08-15 NOTE — Assessment & Plan Note (Signed)
Patient has been doing very well with conservative therapy at this time.  Discussed icing regimen and home exercises.  Discussed which activities to do and which ones to avoid.  Increase activity slowly.  Follow-up again in 6 to 8 weeks.  Patient is doing better with the control of his diabetes as well recently.

## 2022-08-16 ENCOUNTER — Other Ambulatory Visit (HOSPITAL_COMMUNITY): Payer: Self-pay

## 2022-08-18 ENCOUNTER — Ambulatory Visit: Payer: Commercial Managed Care - PPO | Admitting: Family

## 2022-08-18 ENCOUNTER — Encounter: Payer: Self-pay | Admitting: Family

## 2022-08-18 ENCOUNTER — Other Ambulatory Visit (HOSPITAL_COMMUNITY): Payer: Self-pay

## 2022-08-18 VITALS — BP 118/78 | HR 112 | Temp 97.8°F | Ht 68.0 in | Wt 144.6 lb

## 2022-08-18 DIAGNOSIS — Z8639 Personal history of other endocrine, nutritional and metabolic disease: Secondary | ICD-10-CM | POA: Diagnosis not present

## 2022-08-18 DIAGNOSIS — E1165 Type 2 diabetes mellitus with hyperglycemia: Secondary | ICD-10-CM | POA: Diagnosis not present

## 2022-08-18 DIAGNOSIS — R7309 Other abnormal glucose: Secondary | ICD-10-CM

## 2022-08-18 DIAGNOSIS — Z125 Encounter for screening for malignant neoplasm of prostate: Secondary | ICD-10-CM | POA: Diagnosis not present

## 2022-08-18 LAB — POCT GLYCOSYLATED HEMOGLOBIN (HGB A1C): Hemoglobin A1C: 8.6 % — AB (ref 4.0–5.6)

## 2022-08-18 MED ORDER — LANTUS SOLOSTAR 100 UNIT/ML ~~LOC~~ SOPN
3.0000 [IU] | PEN_INJECTOR | Freq: Every day | SUBCUTANEOUS | 2 refills | Status: DC
  Filled 2022-08-18: qty 3, 28d supply, fill #0
  Filled 2022-09-13 – 2022-09-28 (×2): qty 3, 28d supply, fill #1
  Filled 2023-01-22: qty 3, 28d supply, fill #2
  Filled 2023-06-07: qty 3, 28d supply, fill #3

## 2022-08-18 NOTE — Patient Instructions (Signed)
Start Lantus 3 units given in the morning.  Every 5 days you may increase by 1  unit until fasting blood sugars approach 130.

## 2022-08-18 NOTE — Progress Notes (Signed)
Assessment & Plan:  Type 2 diabetes mellitus with hyperglycemia, without long-term current use of insulin (Jim [name]) Assessment & Plan: Uncontrolled.  Continue glipizide 10 mg twice daily with a meal, metformin 1000 mg BID. Start lantus 3 units qam.  Strongly advised statin therapy in the setting of diabetes to reduce ASCVD risk and obtain LDL less than 70.  He will consider.   Orders: -     CBC with Differential/Platelet; Future -     Comprehensive metabolic panel; Future -     Lipid panel; Future -     TSH; Future -     POCT glycosylated hemoglobin (Hb A1C) -     Microalbumin / creatinine urine ratio; Future -     Lantus SoloStar; Inject 3 Units into the skin daily.  Dispense: 15 mL; Refill: 2  Elevated glucose -     POCT glycosylated hemoglobin (Hb A1C)  History of vitamin D deficiency  Screening for prostate cancer -     PSA; Future     Return precautions given.   Risks, benefits, and alternatives of the medications and treatment plan prescribed today were discussed, and patient expressed understanding.   Education regarding symptom management and diagnosis given to patient on AVS either electronically or printed.  Return in about 3 months (around 11/16/2022) for Fasting labs in 2-3 weeks.  Jim Paris, FNP  Subjective:    Patient ID: Jim [name], male    DOB: Sep 07, 1965, 57 y.o.   MRN: [id number]  CC: Jim [name] is a 57 y.o. male who presents today for follow up.   HPI: Feels well today.  No new complaints  diabetes-compliant with glipizide 10 mg twice daily with a meal, metformin 1000 mg BID. FGB 150. Post prandial 250 or higher.  Wearing CGM He has had two URI the last couple of months.  He has been on lantus in the past and done well on medication.  No hypoglycemic episodes.     Allergies: Jardiance [empagliflozin] and Ciprofloxacin Current Outpatient Medications on File Prior to Visit  Medication Sig Dispense Refill   acetaminophen (TYLENOL)  500 MG tablet Take 500 mg by mouth every 6 (six) hours as needed.     Continuous Blood Gluc Sensor (FREESTYLE LIBRE 3 SENSOR) MISC Use as directed and change every 14 days 2 each 3   glipiZIDE (GLUCOTROL) 5 MG tablet Take 2 tablets (10 mg total) by mouth 2 (two) times daily before a meal. 360 tablet 3   metFORMIN (GLUCOPHAGE-XR) 500 MG 24 hr tablet Take 2 tablets (1,000 mg total) by mouth in the morning and at bedtime. 120 tablet 3   Glucose Blood (BLOOD GLUCOSE TEST STRIPS) STRP True Result test strips, to test blood glucose 3 times daily, use as directed (Patient not taking: Reported on 06/14/2022) 100 each 5   TRUEPLUS LANCETS 30G MISC USE AS DIRECTED (Patient not taking: Reported on 08/18/2022) 100 each 11   No current facility-administered medications on file prior to visit.    Review of Systems  Constitutional:  Negative for chills and fever.  Respiratory:  Negative for cough.   Cardiovascular:  Negative for chest pain and palpitations.  Gastrointestinal:  Negative for nausea and vomiting.      Objective:    BP 118/78   Pulse (!) 112   Temp 97.8 F (36.6 C) (Oral)   Ht '5\' 8"'$  (1.727 m)   Wt 144 lb 9.6 oz (65.6 kg)   SpO2 96%   BMI 21.99 kg/m  BP Readings from Last 3 Encounters:  08/18/22 118/78  08/15/22 110/80  06/14/22 128/76   Wt Readings from Last 3 Encounters:  08/18/22 144 lb 9.6 oz (65.6 kg)  08/15/22 146 lb (66.2 kg)  06/14/22 145 lb 12.8 oz (66.1 kg)    Physical Exam Vitals reviewed.  Constitutional:      Appearance: He is well-developed.  Cardiovascular:     Rate and Rhythm: Regular rhythm.     Heart sounds: Normal heart sounds.  Pulmonary:     Effort: Pulmonary effort is normal. No respiratory distress.     Breath sounds: Normal breath sounds. No wheezing, rhonchi or rales.  Skin:    General: Skin is warm and dry.  Neurological:     Mental Status: He is alert.  Psychiatric:        Speech: Speech normal.        Behavior: Behavior normal.

## 2022-08-18 NOTE — Assessment & Plan Note (Signed)
Uncontrolled.  Continue glipizide 10 mg twice daily with a meal, metformin 1000 mg BID. Start lantus 3 units qam.  Strongly advised statin therapy in the setting of diabetes to reduce ASCVD risk and obtain LDL less than 70.  He will consider.

## 2022-08-23 ENCOUNTER — Other Ambulatory Visit (HOSPITAL_COMMUNITY): Payer: Self-pay

## 2022-09-06 ENCOUNTER — Other Ambulatory Visit (INDEPENDENT_AMBULATORY_CARE_PROVIDER_SITE_OTHER): Payer: Commercial Managed Care - PPO

## 2022-09-06 DIAGNOSIS — E1165 Type 2 diabetes mellitus with hyperglycemia: Secondary | ICD-10-CM

## 2022-09-06 DIAGNOSIS — Z125 Encounter for screening for malignant neoplasm of prostate: Secondary | ICD-10-CM | POA: Diagnosis not present

## 2022-09-06 LAB — COMPREHENSIVE METABOLIC PANEL
ALT: 29 U/L (ref 0–53)
AST: 17 U/L (ref 0–37)
Albumin: 4.4 g/dL (ref 3.5–5.2)
Alkaline Phosphatase: 67 U/L (ref 39–117)
BUN: 18 mg/dL (ref 6–23)
CO2: 22 mEq/L (ref 19–32)
Calcium: 9.9 mg/dL (ref 8.4–10.5)
Chloride: 103 mEq/L (ref 96–112)
Creatinine, Ser: 0.96 mg/dL (ref 0.40–1.50)
GFR: 89.46 mL/min (ref >60.00)
Glucose, Bld: 234 mg/dL — ABNORMAL HIGH (ref 70–99)
Potassium: 4.7 mEq/L (ref 3.5–5.1)
Sodium: 142 mEq/L (ref 135–145)
Total Bilirubin: 0.3 mg/dL (ref 0.2–1.2)
Total Protein: 6.5 g/dL (ref 6.0–8.3)

## 2022-09-06 LAB — LIPID PANEL
Cholesterol: 170 mg/dL (ref 0–200)
HDL: 39.5 mg/dL (ref >39.00)
NonHDL: 130.91
Total CHOL/HDL Ratio: 4
Triglycerides: 280 mg/dL — ABNORMAL HIGH (ref 0.0–149.0)
VLDL: 56 mg/dL — ABNORMAL HIGH (ref 0.0–40.0)

## 2022-09-06 LAB — CBC WITH DIFFERENTIAL/PLATELET
Basophils Absolute: 0.1 10*3/uL (ref 0.0–0.1)
Basophils Relative: 1 % (ref 0.0–3.0)
Eosinophils Absolute: 0.7 10*3/uL (ref 0.0–0.7)
Eosinophils Relative: 9.5 % — ABNORMAL HIGH (ref 0.0–5.0)
HCT: 43.8 % (ref 39.0–52.0)
Hemoglobin: 14.8 g/dL (ref 13.0–17.0)
Lymphocytes Relative: 24.4 % (ref 12.0–46.0)
Lymphs Abs: 1.9 10*3/uL (ref 0.7–4.0)
MCHC: 33.8 g/dL (ref 30.0–36.0)
MCV: 86.2 fl (ref 78.0–100.0)
Monocytes Absolute: 1 10*3/uL (ref 0.1–1.0)
Monocytes Relative: 13.5 % — ABNORMAL HIGH (ref 3.0–12.0)
Neutro Abs: 3.9 10*3/uL (ref 1.4–7.7)
Neutrophils Relative %: 51.6 % (ref 43.0–77.0)
Platelets: 316 10*3/uL (ref 150.0–400.0)
RBC: 5.08 Mil/uL (ref 4.22–5.81)
RDW: 13.7 % (ref 11.5–15.5)
WBC: 7.6 10*3/uL (ref 4.0–10.5)

## 2022-09-06 LAB — PSA: PSA: 0.71 ng/mL (ref 0.10–4.00)

## 2022-09-06 LAB — TSH: TSH: 3.71 u[IU]/mL (ref 0.35–5.50)

## 2022-09-07 LAB — MICROALBUMIN / CREATININE URINE RATIO
Creatinine,U: 101.9 mg/dL
Microalb Creat Ratio: 0.8 mg/g (ref 0.0–30.0)
Microalb, Ur: 0.8 mg/dL (ref 0.0–1.9)

## 2022-09-07 LAB — LDL CHOLESTEROL, DIRECT: Direct LDL: 91 mg/dL

## 2022-09-10 ENCOUNTER — Other Ambulatory Visit: Payer: Self-pay | Admitting: Family

## 2022-09-10 DIAGNOSIS — R899 Unspecified abnormal finding in specimens from other organs, systems and tissues: Secondary | ICD-10-CM

## 2022-09-11 ENCOUNTER — Other Ambulatory Visit (HOSPITAL_COMMUNITY): Payer: Self-pay

## 2022-09-13 ENCOUNTER — Other Ambulatory Visit (HOSPITAL_COMMUNITY): Payer: Self-pay

## 2022-09-13 ENCOUNTER — Other Ambulatory Visit: Payer: Self-pay | Admitting: Family

## 2022-09-13 DIAGNOSIS — H5213 Myopia, bilateral: Secondary | ICD-10-CM | POA: Diagnosis not present

## 2022-09-13 DIAGNOSIS — E1165 Type 2 diabetes mellitus with hyperglycemia: Secondary | ICD-10-CM

## 2022-09-13 LAB — HM DIABETES EYE EXAM

## 2022-09-13 MED ORDER — METFORMIN HCL ER 500 MG PO TB24
1000.0000 mg | ORAL_TABLET | Freq: Two times a day (BID) | ORAL | 3 refills | Status: DC
  Filled 2022-09-13 – 2022-09-28 (×2): qty 120, 30d supply, fill #0
  Filled 2022-10-31: qty 120, 30d supply, fill #1
  Filled 2022-12-02: qty 120, 30d supply, fill #2
  Filled 2023-01-01: qty 120, 30d supply, fill #3

## 2022-09-14 ENCOUNTER — Encounter: Payer: Self-pay | Admitting: Internal Medicine

## 2022-09-22 ENCOUNTER — Other Ambulatory Visit (HOSPITAL_COMMUNITY): Payer: Self-pay

## 2022-09-28 ENCOUNTER — Other Ambulatory Visit (HOSPITAL_COMMUNITY): Payer: Self-pay

## 2022-10-17 ENCOUNTER — Other Ambulatory Visit (HOSPITAL_COMMUNITY): Payer: Self-pay

## 2022-10-18 NOTE — Progress Notes (Signed)
  [name] [address] [place] [address] Phone: [phone] Subjective:   Jim [name], am serving as a scribe for Dr. Antoine Primas.  I'm seeing this patient by the request  of:  [name], FNP  CC: Back and neck pain follow-up  [id number]  Jim [name] is a 57 y.o. male coming in with complaint of back and neck pain. OMT 08/15/2022. Patient states continues to have some discomfort and pain but nothing severe.  Nothing that stopping him from activity.  He is aware of some of the back pain now.  Medications patient has been prescribed: None  Taking:         Reviewed prior external information including notes and imaging from previsou exam, outside providers and external EMR if available.   As well as notes that were available from care everywhere and other healthcare systems.  Past medical history, social, surgical and family history all reviewed in electronic medical record.  No pertanent information unless stated regarding to the chief complaint.   Past Medical History:  Diagnosis Date   Diabetes mellitus 2012   GERD (gastroesophageal reflux disease)     Allergies  Allergen Reactions   Jardiance [Empagliflozin]     Fatigue, weight loss   Ciprofloxacin Rash     Review of Systems:  No headache, visual changes, nausea, vomiting, diarrhea, constipation, dizziness, abdominal pain, skin rash, fevers, chills, night sweats, weight loss, swollen lymph nodes, body aches, joint swelling, chest pain, shortness of breath, mood changes. POSITIVE muscle aches  Objective  Blood pressure 106/78, pulse (!) 103, height 5\' 8"  (1.727 m), weight 147 lb (66.7 kg), SpO2 96 %.   General: No apparent distress alert and oriented x3 mood and affect normal, dressed appropriately.  HEENT: Pupils equal, extraocular movements intact  Respiratory: Patient's speak in full sentences and does not appear short of breath  Cardiovascular: No  lower extremity edema, non tender, no erythema  Low back exam does have some loss of lordosis.  Some tenderness to palpation in the paraspinal musculature.  Does have tightness also noted  Osteopathic findings  C2 flexed rotated and side bent right T5 extended rotated and side bent right inhaled rib L2 flexed rotated and side bent right Sacrum right on right       Assessment and Plan:  Lumbar radiculopathy No radicular symptoms at the moment but continues to have tightness noted of the low back.  Discussed with patient about icing regimen and home exercises.  Still do more stretching after a lot of traveling.  Increase activity slowly otherwise.  Follow-up again in 6 to 8 weeks.    Nonallopathic problems  Decision today to treat with OMT was based on Physical Exam  After verbal consent patient was treated with HVLA, ME, FPR techniques in cervical, rib, thoracic, lumbar, and sacral  areas  Patient tolerated the procedure well with improvement in symptoms  Patient given exercises, stretches and lifestyle modifications  See medications in patient instructions if given  Patient will follow up in 4-8 weeks     The above documentation has been reviewed and is accurate and complete [name], DO         Note: This dictation was prepared with Dragon dictation along with smaller phrase technology. Any transcriptional errors that result from this process are unintentional.

## 2022-10-20 ENCOUNTER — Ambulatory Visit: Payer: Commercial Managed Care - PPO | Admitting: Family Medicine

## 2022-10-20 ENCOUNTER — Encounter: Payer: Self-pay | Admitting: Family Medicine

## 2022-10-20 VITALS — BP 106/78 | HR 103 | Ht 68.0 in | Wt 147.0 lb

## 2022-10-20 DIAGNOSIS — M5416 Radiculopathy, lumbar region: Secondary | ICD-10-CM

## 2022-10-20 DIAGNOSIS — M9903 Segmental and somatic dysfunction of lumbar region: Secondary | ICD-10-CM | POA: Diagnosis not present

## 2022-10-20 DIAGNOSIS — M9902 Segmental and somatic dysfunction of thoracic region: Secondary | ICD-10-CM

## 2022-10-20 DIAGNOSIS — M9901 Segmental and somatic dysfunction of cervical region: Secondary | ICD-10-CM

## 2022-10-20 DIAGNOSIS — M9904 Segmental and somatic dysfunction of sacral region: Secondary | ICD-10-CM | POA: Diagnosis not present

## 2022-10-20 DIAGNOSIS — M9908 Segmental and somatic dysfunction of rib cage: Secondary | ICD-10-CM | POA: Diagnosis not present

## 2022-10-20 IMAGING — MR MR LUMBAR SPINE W/O CM
4 of 5 series · 26 of 48 positions shown · non-contrast
Comparison: Lumbar radiographs April 13, 2016.

CLINICAL DATA: Low back pain.

EXAM:
MRI LUMBAR SPINE WITHOUT CONTRAST
TECHNIQUE: Multiplanar, multisequence MR imaging of the lumbar spine was
performed. No intravenous contrast was administered.

[Series 5: T2 · sagittal · 4.0mm · 0.73mm/px · 6 of 16 slices shown (1 of 2)]
[im 1/16]
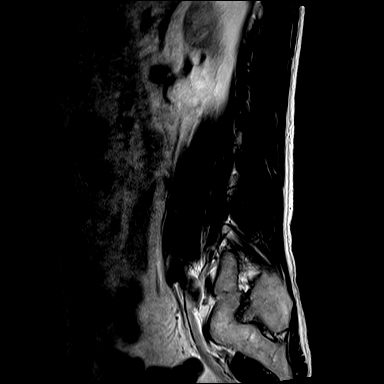
[im 4/16]
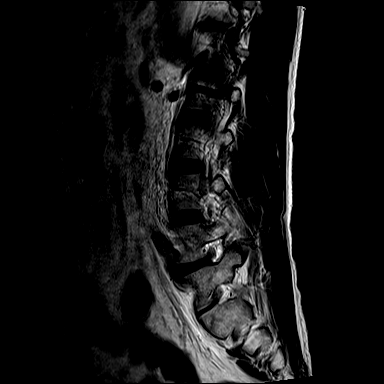
[im 7/16]
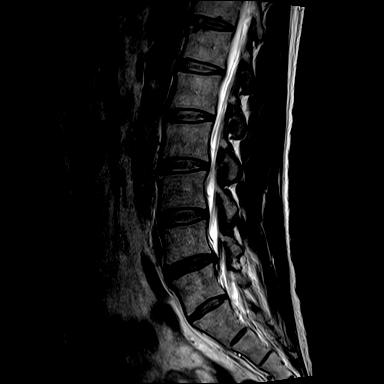
[im 10/16]
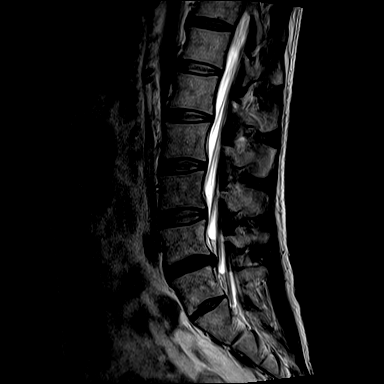
[im 13/16]
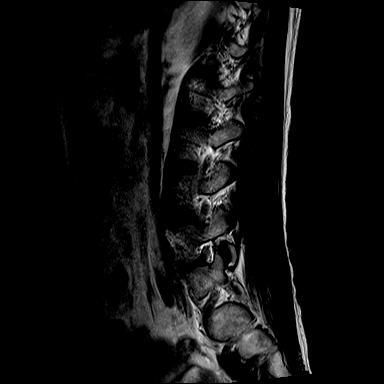
[im 16/16]
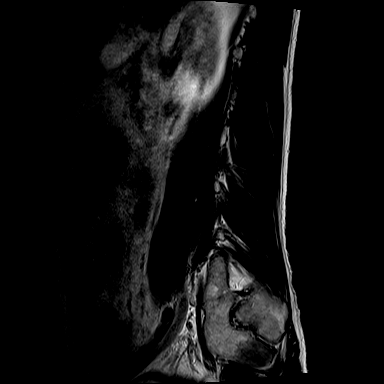

[Series 7: T1 · sagittal · 4.0mm · 0.88mm/px · 7 of 16 slices shown (1 of 2)]
[im 1/16]
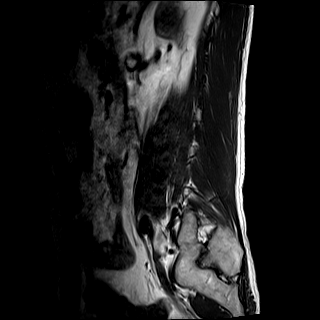
[im 3/16]
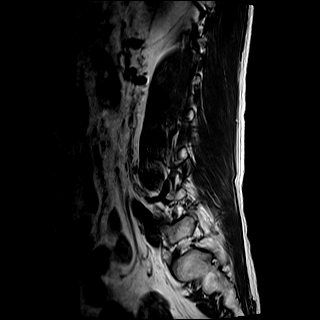
[im 6/16]
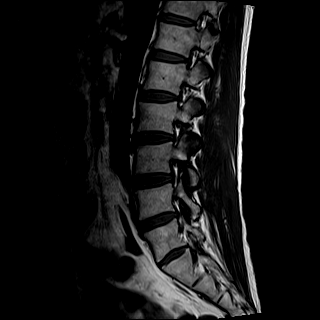
[im 8/16]
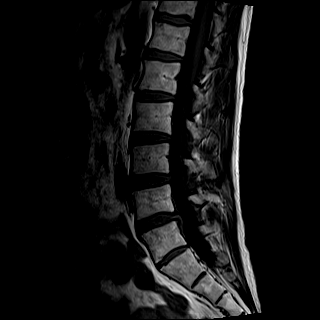
[im 11/16]
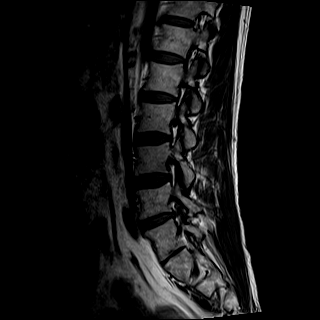
[im 13/16]
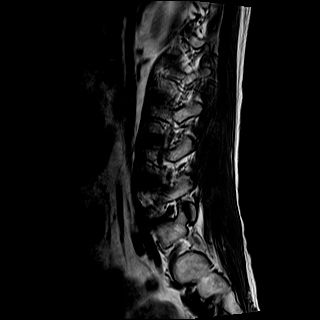
[im 16/16]
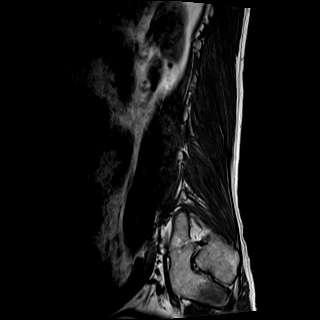

[Series 8: T2 · axial · 4.0mm · 0.57mm/px · z∈[-98,+109]mm · 8 of 34 slices shown (2 of 2)]
[im 1/34]
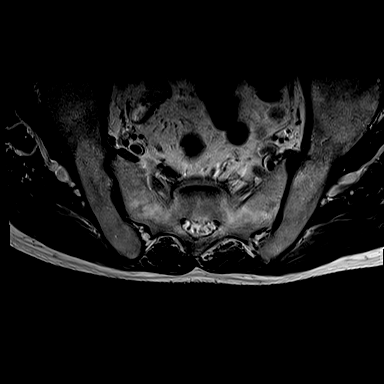
[im 6/34]
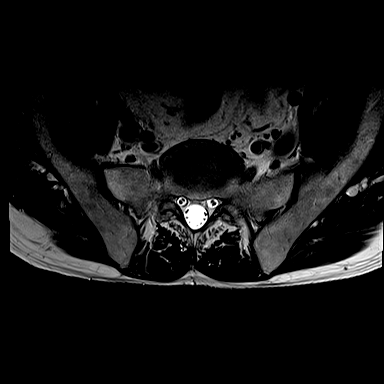
[im 11/34]
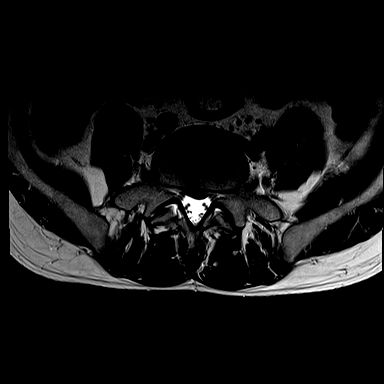
[im 16/34]
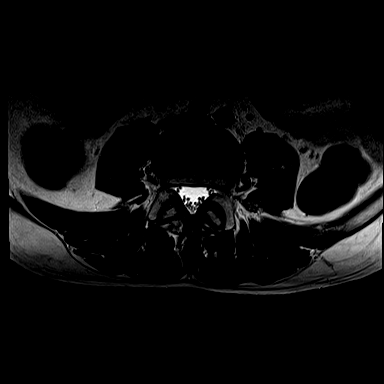
[im 18/34]
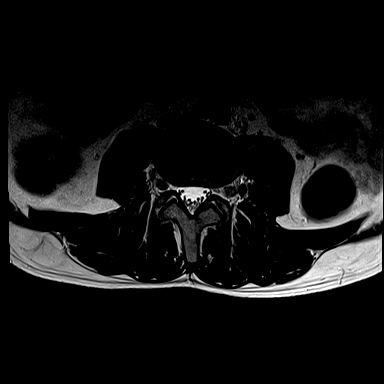
[im 23/34]
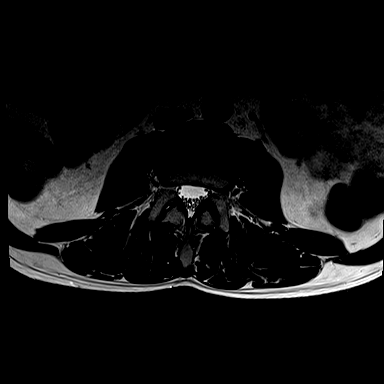
[im 28/34]
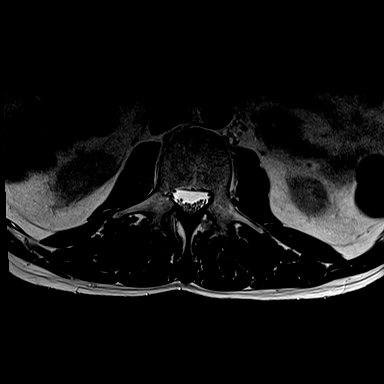
[im 34/34]
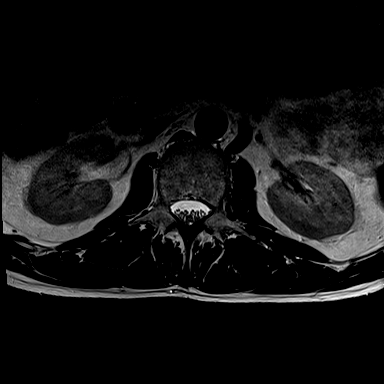

[Series 9: T1 · axial · 4.0mm · 0.34mm/px · z∈[-98,+79]mm · 5 of 34 slices shown (2 of 2)]
[im 1/34]
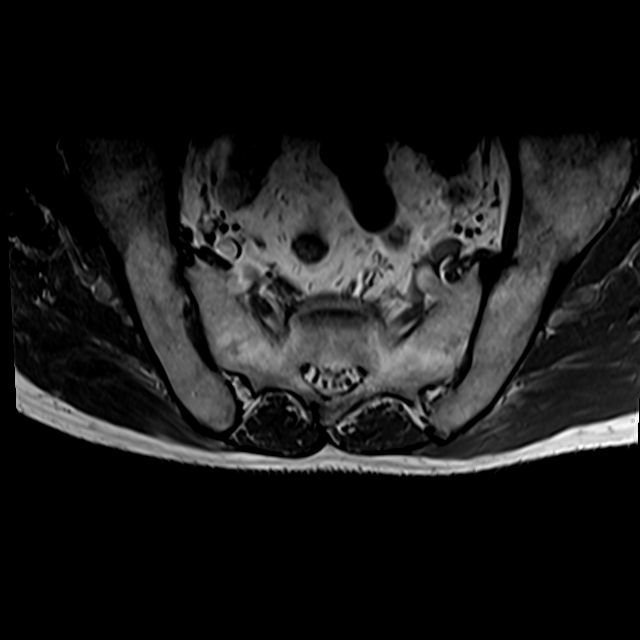
[im 6/34]
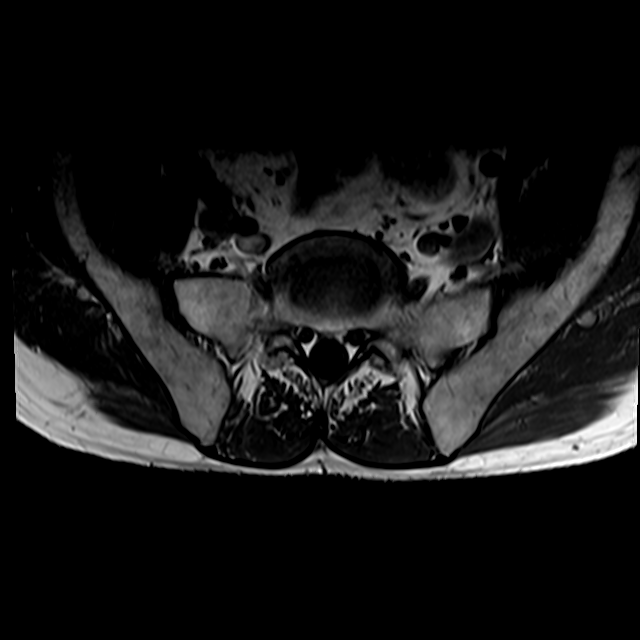
[im 11/34]
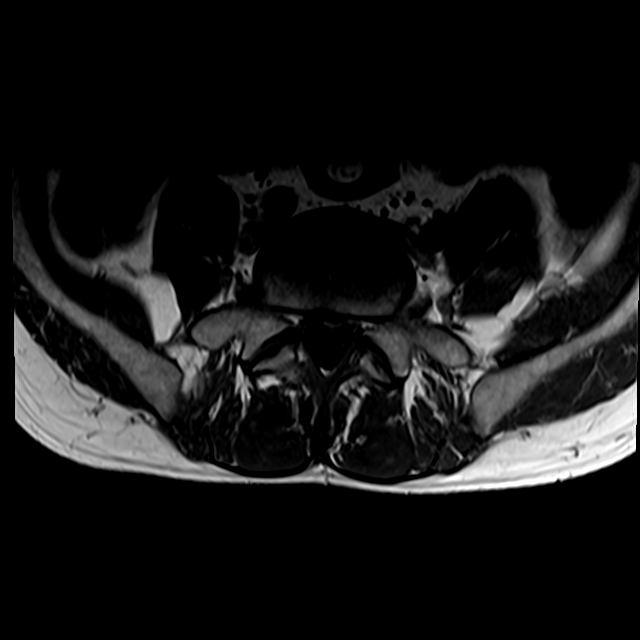
[im 18/34]
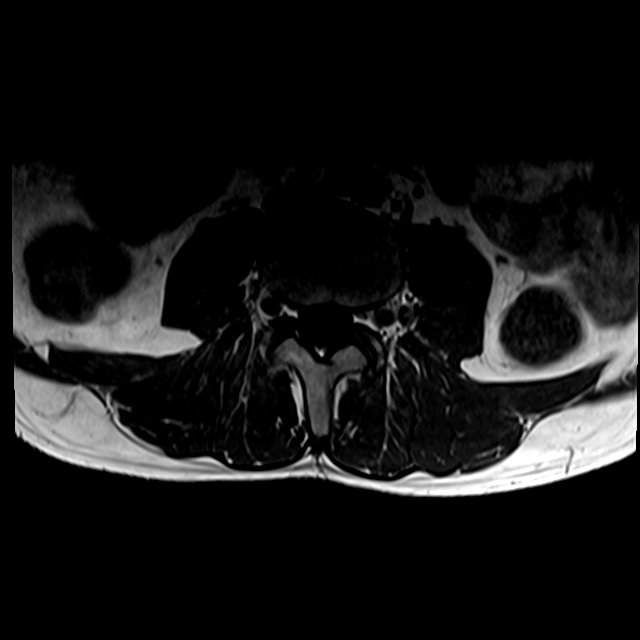
[im 28/34]
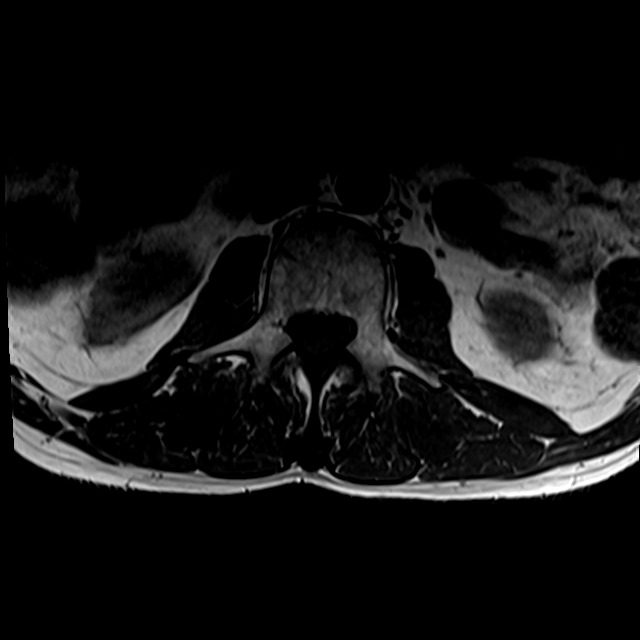

[26 of 48 positions shown; findings below may reference images not displayed]

FINDINGS: Segmentation:  Transitional lumbosacral anatomy with lumbarized S1.

Alignment:  Normal.

Vertebrae: Vertebral body heights are maintained. No specific
evidence of acute fracture, discitis/osteomyelitis, or suspicious
bone lesion.

Conus medullaris and cauda equina: Conus extends to the L2. level.
Conus and cauda equina appear normal.

Paraspinal and other soft tissues: Unremarkable.

Disc levels:

T12-L1: Only imaged sagittally without evidence of significant canal
or foraminal stenosis.

L1-L2: Only imaged sagittally without evidence of significant canal
or foraminal stenosis.

L2-L3: No significant disc protrusion, foraminal stenosis, or canal
stenosis.

L3-L4: Mild facet hypertrophy without significant canal or foraminal
stenosis.

L4-L5: Mild facet hypertrophy without significant canal or foraminal
stenosis.

L5-S1: Disc desiccation. Broad disc bulge with superimposed
inferiorly dissecting central disc protrusion. Disc contact
bilateral descending S1 nerve roots with mild right greater than
left subarticular recess narrowing. No significant central canal
stenosis. Mild bilateral foraminal stenosis.

S1-S2: No significant canal or foraminal stenosis.
IMPRESSION: 1. Transitional lumbosacral anatomy with lumbarized S1.
2. At L5-S1 there is a an inferiorly dissecting central disc
protrusion which contacts bilateral descending S1 nerve roots with
mild right greater than left subarticular recess narrowing. Mild
bilateral foraminal stenosis at this level.

## 2022-10-20 NOTE — Patient Instructions (Signed)
Good to see you Careful with yardwork See me in 2-3 months

## 2022-10-20 NOTE — Assessment & Plan Note (Signed)
No radicular symptoms at the moment but continues to have tightness noted of the low back.  Discussed with patient about icing regimen and home exercises.  Still do more stretching after a lot of traveling.  Increase activity slowly otherwise.  Follow-up again in 6 to 8 weeks.

## 2022-11-01 ENCOUNTER — Other Ambulatory Visit (HOSPITAL_COMMUNITY): Payer: Self-pay

## 2022-11-17 ENCOUNTER — Encounter: Payer: Self-pay | Admitting: Family

## 2022-11-17 ENCOUNTER — Ambulatory Visit: Payer: Commercial Managed Care - PPO | Admitting: Family

## 2022-11-17 VITALS — BP 112/80 | HR 84 | Temp 98.4°F | Ht 69.0 in | Wt 141.4 lb

## 2022-11-17 DIAGNOSIS — Z7984 Long term (current) use of oral hypoglycemic drugs: Secondary | ICD-10-CM | POA: Diagnosis not present

## 2022-11-17 DIAGNOSIS — R7309 Other abnormal glucose: Secondary | ICD-10-CM

## 2022-11-17 DIAGNOSIS — E1165 Type 2 diabetes mellitus with hyperglycemia: Secondary | ICD-10-CM

## 2022-11-17 LAB — POCT GLYCOSYLATED HEMOGLOBIN (HGB A1C): Hemoglobin A1C: 8.1 % — AB (ref 4.0–5.6)

## 2022-11-17 NOTE — Progress Notes (Signed)
Assessment & Plan:  Elevated glucose -     POCT glycosylated hemoglobin (Hb A1C)  Type 2 diabetes mellitus with hyperglycemia, without long-term current use of insulin (HCC) Assessment & Plan: Lab Results  Component Value Date   HGBA1C 8.1 (A) 11/17/2022   Slight  improvement from prior. Average glucose 165.  Glucose management indicator 7.3%. glucose variability 29%.  No very low glucose.  1% in low range.  Upon review of  report, he  has had early morning hypoglycemia in the 60s between 12 and 4 AM on several occasions.  Discussed concern with taking glipizide 10 mg with dinner when particularly for light meal.  We jointly agreed not to escalate regimen at this time as based on glucose management indicator, A1c is continuing to trend down.  I expressed my concern regards to hypoglycemic episodes.  Continue metformin 1000 mg BID. Advised patient to take Lantus 3 units in the morning and not to take at night.  Also advised him to take glipizide 5 mg versus 10 mg when he is having a small dinner or a low carbohydrate dinner.  Advised him to let me know right away of any further low blood sugar readings.  He verbalized understanding of all.      Return precautions given.   Risks, benefits, and alternatives of the medications and treatment plan prescribed today were discussed, and patient expressed understanding.   Education regarding symptom management and diagnosis given to patient on AVS either electronically or printed.  Return in about 3 months (around 02/17/2023).  [name], FNP  Subjective:    Patient ID: Jim [name], male    DOB: June 09, 1966, 57 y.o.   MRN: [id number]  CC: Jim [name] is a 57 y.o. male who presents today for follow up.   HPI: Feels well today No complaints Over the past 2 weeks he has noticed blood sugars improving Eating jimmy dean bowl , coffee with creamer for breakfast. He eats pack of nabs midmorning.  Eats healthy choice meal or chicken  pot pie for lunch. Dinner varies including chinese or chicken meal such as casserole or spaghetti.      He takes lantus 3-4 qpm if dinner is bigger. FBG 130.   He has had a couple of episode in the middle of the night of 69, alerted by [place]. He thinks related to taking glipizide 10mg  with a light dinner.   He is eating more protein and less carbohydrates.        Allergies: Jardiance [empagliflozin] and Ciprofloxacin Current Outpatient Medications on File Prior to Visit  Medication Sig Dispense Refill   acetaminophen (TYLENOL) 500 MG tablet Take 500 mg by mouth every 6 (six) hours as needed.     Continuous Glucose Sensor (FREESTYLE LIBRE 3 SENSOR) MISC Use as directed and change every 14 days 2 each 3   glipiZIDE (GLUCOTROL) 5 MG tablet Take 2 tablets (10 mg total) by mouth 2 (two) times daily before a meal. 360 tablet 3   Glucose Blood (BLOOD GLUCOSE TEST STRIPS) STRP True Result test strips, to test blood glucose 3 times daily, use as directed 100 each 5   insulin glargine (LANTUS SOLOSTAR) 100 UNIT/ML Solostar Pen Inject 3 Units into the skin daily. (Discard after 28 days of opening) 15 mL 2   metFORMIN (GLUCOPHAGE-XR) 500 MG 24 hr tablet Take 2 tablets (1,000 mg total) by mouth in the morning and at bedtime. 120 tablet 3   TRUEPLUS LANCETS 30G MISC USE  AS DIRECTED 100 each 11   No current facility-administered medications on file prior to visit.    Review of Systems  Constitutional:  Negative for chills and fever.  Respiratory:  Negative for cough.   Cardiovascular:  Negative for chest pain and palpitations.  Gastrointestinal:  Negative for nausea and vomiting.      Objective:    BP 112/80   Pulse 84   Temp 98.4 F (36.9 C) (Oral)   Ht 5\' 9"  (1.753 m)   Wt 141 lb 6.4 oz (64.1 kg)   SpO2 98%   BMI 20.88 kg/m  BP Readings from Last 3 Encounters:  11/17/22 112/80  10/20/22 106/78  08/18/22 118/78   Wt Readings from Last 3 Encounters:  11/17/22 141 lb 6.4 oz  (64.1 kg)  10/20/22 147 lb (66.7 kg)  08/18/22 144 lb 9.6 oz (65.6 kg)    Physical Exam Vitals reviewed.  Constitutional:      Appearance: He is well-developed.  Cardiovascular:     Rate and Rhythm: Regular rhythm.     Heart sounds: Normal heart sounds.  Pulmonary:     Effort: Pulmonary effort is normal. No respiratory distress.     Breath sounds: Normal breath sounds. No wheezing, rhonchi or rales.  Skin:    General: Skin is warm and dry.  Neurological:     Mental Status: He is alert.  Psychiatric:        Speech: Speech normal.        Behavior: Behavior normal.

## 2022-11-17 NOTE — Patient Instructions (Addendum)
Start taking lantus 3 units the morning.   Take glipizide 10mg  with breakfast and then only take 10mg  glipizide with high carb and larger dinner. If smaller dinner or less carbs, take 5mg  glipizide.   As discussed, please let me know right away if you experience another hypoglycemic episode.  I would like to know so we can titrate medications ahead of your follow-up appointment.

## 2022-11-17 NOTE — Assessment & Plan Note (Signed)
Lab Results  Component Value Date   HGBA1C 8.1 (A) 11/17/2022   Slight  improvement from prior. Average glucose 165.  Glucose management indicator 7.3%. glucose variability 29%.  No very low glucose.  1% in low range.  Upon review of  report, he  has had early morning hypoglycemia in the 60s between 12 and 4 AM on several occasions.  Discussed concern with taking glipizide 10 mg with dinner when particularly for light meal.  We jointly agreed not to escalate regimen at this time as based on glucose management indicator, A1c is continuing to trend down.  I expressed my concern regards to hypoglycemic episodes.  Continue metformin 1000 mg BID. Advised patient to take Lantus 3 units in the morning and not to take at night.  Also advised him to take glipizide 5 mg versus 10 mg when he is having a small dinner or a low carbohydrate dinner.  Advised him to let me know right away of any further low blood sugar readings.  He verbalized understanding of all.

## 2022-12-02 ENCOUNTER — Other Ambulatory Visit (HOSPITAL_COMMUNITY): Payer: Self-pay

## 2022-12-02 ENCOUNTER — Other Ambulatory Visit: Payer: Self-pay | Admitting: Family

## 2022-12-04 ENCOUNTER — Other Ambulatory Visit (HOSPITAL_COMMUNITY): Payer: Self-pay

## 2022-12-04 MED ORDER — FREESTYLE LIBRE 3 SENSOR MISC
1.0000 | 3 refills | Status: DC
  Filled 2022-12-04: qty 2, 28d supply, fill #0
  Filled 2023-01-01: qty 2, 28d supply, fill #1
  Filled 2023-02-05: qty 2, 28d supply, fill #2
  Filled 2023-03-06: qty 2, 28d supply, fill #3

## 2022-12-05 ENCOUNTER — Other Ambulatory Visit (HOSPITAL_COMMUNITY): Payer: Self-pay

## 2022-12-09 ENCOUNTER — Other Ambulatory Visit (HOSPITAL_COMMUNITY): Payer: Self-pay

## 2023-01-01 ENCOUNTER — Other Ambulatory Visit (HOSPITAL_COMMUNITY): Payer: Self-pay

## 2023-01-02 ENCOUNTER — Other Ambulatory Visit (HOSPITAL_COMMUNITY): Payer: Self-pay

## 2023-01-11 NOTE — Progress Notes (Signed)
[name] [address] [place] [address] Phone: [phone] Subjective:   INadine Counts, am serving as a scribe for Dr. Antoine Primas.  I'm seeing this patient by the request  of:  [name], FNP  CC: Back and neck pain follow-up  [id number]  Jim [name] is a 57 y.o. male coming in with complaint of back and neck pain. OMT 10/20/2022. Patient states doing well.  Has had some more stress recently.  Patient's wife has continued to have difficulty with her rehabilitation.  Work has been somewhat stressful as well.  Medications patient has been prescribed: None  Taking:         Reviewed prior external information including notes and imaging from previsou exam, outside providers and external EMR if available.   As well as notes that were available from care everywhere and other healthcare systems.  Past medical history, social, surgical and family history all reviewed in electronic medical record.  No pertanent information unless stated regarding to the chief complaint.   Past Medical History:  Diagnosis Date   Diabetes mellitus 2012   GERD (gastroesophageal reflux disease)     Allergies  Allergen Reactions   Jardiance [Empagliflozin]     Fatigue, weight loss   Ciprofloxacin Rash     Review of Systems:  No headache, visual changes, nausea, vomiting, diarrhea, constipation, dizziness, abdominal pain, skin rash, fevers, chills, night sweats, weight loss, swollen lymph nodes, body aches, joint swelling, chest pain, shortness of breath, mood changes. POSITIVE muscle aches  Objective  Blood pressure 118/70, pulse (!) 104, height 5\' 9"  (1.753 m), weight 145 lb (65.8 kg), SpO2 97 %.   General: No apparent distress alert and oriented x3 mood and affect normal, dressed appropriately.  HEENT: Pupils equal, extraocular movements intact  Respiratory: Patient's speak in full sentences and does not appear short of breath   Cardiovascular: No lower extremity edema, non tender, no erythema  Low back exam does have some mild loss of lordosis.  Neck exam also has some limited sidebending bilaterally.  Osteopathic findings  C2 flexed rotated and side bent right C6 flexed rotated and side bent left C7 flexed rotated and side bent T3 extended rotated and side bent right inhaled rib T9 extended rotated and side bent right L2 flexed rotated and side bent right Sacrum right on right       Assessment and Plan:  Lumbar radiculopathy Likely patient does not have any radicular symptoms at the moment.  No change in medications.  Patient does need to continue to work on core strength.  Will need to continue to monitor patient's weight.  Seems to be stabilized at the moment though.  Follow-up again in2-3 months    Nonallopathic problems  Decision today to treat with OMT was based on Physical Exam  After verbal consent patient was treated with HVLA, ME, FPR techniques in cervical, rib, thoracic, lumbar, and sacral  areas  Patient tolerated the procedure well with improvement in symptoms  Patient given exercises, stretches and lifestyle modifications  See medications in patient instructions if given  Patient will follow up in 8 weeks      The above documentation has been reviewed and is accurate and complete [name], DO        Note: This dictation was prepared with Dragon dictation along with smaller phrase technology. Any transcriptional errors that result from this process are unintentional.

## 2023-01-12 ENCOUNTER — Encounter: Payer: Self-pay | Admitting: Family Medicine

## 2023-01-12 ENCOUNTER — Ambulatory Visit: Payer: Commercial Managed Care - PPO | Admitting: Family Medicine

## 2023-01-12 VITALS — BP 118/70 | HR 104 | Ht 69.0 in | Wt 145.0 lb

## 2023-01-12 DIAGNOSIS — M9908 Segmental and somatic dysfunction of rib cage: Secondary | ICD-10-CM

## 2023-01-12 DIAGNOSIS — M9901 Segmental and somatic dysfunction of cervical region: Secondary | ICD-10-CM

## 2023-01-12 DIAGNOSIS — M5416 Radiculopathy, lumbar region: Secondary | ICD-10-CM | POA: Diagnosis not present

## 2023-01-12 DIAGNOSIS — M9902 Segmental and somatic dysfunction of thoracic region: Secondary | ICD-10-CM | POA: Diagnosis not present

## 2023-01-12 DIAGNOSIS — M9904 Segmental and somatic dysfunction of sacral region: Secondary | ICD-10-CM | POA: Diagnosis not present

## 2023-01-12 DIAGNOSIS — M9903 Segmental and somatic dysfunction of lumbar region: Secondary | ICD-10-CM

## 2023-01-12 NOTE — Assessment & Plan Note (Signed)
Likely patient does not have any radicular symptoms at the moment.  No change in medications.  Patient does need to continue to work on core strength.  Will need to continue to monitor patient's weight.  Seems to be stabilized at the moment though.  Follow-up again in2-3 months

## 2023-01-12 NOTE — Patient Instructions (Signed)
Good to see you! Stay active Tell you're better half hi See you again in 7-8 weeks

## 2023-01-22 ENCOUNTER — Other Ambulatory Visit (HOSPITAL_COMMUNITY): Payer: Self-pay

## 2023-01-24 ENCOUNTER — Other Ambulatory Visit (HOSPITAL_COMMUNITY): Payer: Self-pay

## 2023-02-05 ENCOUNTER — Other Ambulatory Visit (HOSPITAL_COMMUNITY): Payer: Self-pay

## 2023-02-05 ENCOUNTER — Other Ambulatory Visit: Payer: Self-pay | Admitting: Family

## 2023-02-05 DIAGNOSIS — E1165 Type 2 diabetes mellitus with hyperglycemia: Secondary | ICD-10-CM

## 2023-02-05 MED ORDER — METFORMIN HCL ER 500 MG PO TB24
1000.0000 mg | ORAL_TABLET | Freq: Two times a day (BID) | ORAL | 3 refills | Status: DC
  Filled 2023-02-05: qty 120, 30d supply, fill #0

## 2023-02-21 ENCOUNTER — Other Ambulatory Visit (HOSPITAL_COMMUNITY): Payer: Self-pay

## 2023-02-23 ENCOUNTER — Ambulatory Visit (INDEPENDENT_AMBULATORY_CARE_PROVIDER_SITE_OTHER): Payer: No Typology Code available for payment source | Admitting: Family

## 2023-02-23 ENCOUNTER — Encounter: Payer: Self-pay | Admitting: Family

## 2023-02-23 ENCOUNTER — Other Ambulatory Visit (HOSPITAL_COMMUNITY): Payer: Self-pay

## 2023-02-23 VITALS — BP 118/70 | HR 87 | Temp 97.8°F | Ht 69.0 in | Wt 144.6 lb

## 2023-02-23 DIAGNOSIS — Z7984 Long term (current) use of oral hypoglycemic drugs: Secondary | ICD-10-CM

## 2023-02-23 DIAGNOSIS — E1165 Type 2 diabetes mellitus with hyperglycemia: Secondary | ICD-10-CM

## 2023-02-23 DIAGNOSIS — R7309 Other abnormal glucose: Secondary | ICD-10-CM

## 2023-02-23 LAB — POCT GLYCOSYLATED HEMOGLOBIN (HGB A1C): Hemoglobin A1C: 7.3 % — AB (ref 4.0–5.6)

## 2023-02-23 MED ORDER — GLIPIZIDE 5 MG PO TABS
5.0000 mg | ORAL_TABLET | Freq: Two times a day (BID) | ORAL | 3 refills | Status: DC
Start: 2023-02-23 — End: 2024-03-08
  Filled 2023-02-23: qty 180, 90d supply, fill #0
  Filled 2023-07-06: qty 60, 30d supply, fill #0
  Filled 2023-08-05: qty 60, 30d supply, fill #1
  Filled 2023-09-03: qty 60, 30d supply, fill #2
  Filled 2023-10-03 – 2023-10-05 (×2): qty 60, 30d supply, fill #3
  Filled 2023-11-02: qty 60, 30d supply, fill #4
  Filled 2023-12-07: qty 60, 30d supply, fill #5
  Filled 2024-01-06: qty 60, 30d supply, fill #6
  Filled 2024-02-06: qty 60, 30d supply, fill #7

## 2023-02-23 NOTE — Patient Instructions (Signed)
Decrease glipizide to 5 mg in the morning with breakfast and 5 mg with supper.  Hold this medication with a low carbohydrate meal or if he were to skip a meal  Referral to pharmacy for further advice in regards to medications. Please use glucometer and check to see if actually experiencing a low with the [place]. Please call me with low reading

## 2023-02-23 NOTE — Progress Notes (Signed)
Assessment & Plan:  Type 2 diabetes mellitus with hyperglycemia, without long-term current use of insulin (HCC) Assessment & Plan: Lab Results  Component Value Date   HGBA1C 7.3 (A) 02/23/2023   Improved.  Discussed goal of A1c for him may be closer to 7.   Reviewed CGM report.  Active 88% of the time.  Average glucose 150, glucose management indicator 6.9%, glucose variability 30.7%   I identified a low on August 4 at 6 PM, August 5 10 PM, August 7 at 8 AM.  We discussed concern for hypoglycemic episodes however he is not having hypoglycemic symptoms.    Advised him to use glucometer and check to see if actually experiencing a low with the [place].Advised him to go ahead and decrease glipizide to 5 mg with breakfast and with supper.  He will hold this medication with a low carbohydrate meal or if he were to skip a meal.   Continue metformin 2000mg  every day. He is taking lantus 3units qam every other day which is likely ineffective due to half life of medication ; I question if Lantus is effective at such a low dose.  I placed a referral to pharmacy for further advice in regards to titration of medications.     Orders: -     glipiZIDE; Take 1 tablet (5 mg total) by mouth 2 (two) times daily before a meal.  Dispense: 180 tablet; Refill: 3 -     AMB Referral to Pharmacy Medication Management  Elevated glucose -     POCT glycosylated hemoglobin (Hb A1C)     Return precautions given.   Risks, benefits, and alternatives of the medications and treatment plan prescribed today were discussed, and patient expressed understanding.   Education regarding symptom management and diagnosis given to patient on AVS either electronically or printed.  Return in about 3 months (around 05/26/2023).  [name], FNP  Subjective:    Patient ID: Jim [name], male    DOB: 06-21-1966, 57 y.o.   MRN: [id number]  CC: Jim [name] is a 57 y.o. male who presents today for follow up.   HPI:  Feels well today.  He notes yesterday when he placed a new sensor for CGM that he thinks the meter might have been Halina Maidens' as he had low blood sugars 56 at breakfast time.  He has been eating Location manager croissant breakfast.  He had taken Lantus 3 units, and glipizide 10 mg.  He did not feel hypoglycemic at that time.    Compliant  metformin 1000 mg BID, Lantus 3 units every other morning,  glipizide 10mg  qam and 5mg  with dinner.   He is eating low carb dinners.        Allergies: Jardiance [empagliflozin] and Ciprofloxacin Current Outpatient Medications on File Prior to Visit  Medication Sig Dispense Refill   acetaminophen (TYLENOL) 500 MG tablet Take 500 mg by mouth every 6 (six) hours as needed.     Continuous Glucose Sensor (FREESTYLE LIBRE 3 SENSOR) MISC Use as directed and change every 14 days 2 each 3   Glucose Blood (BLOOD GLUCOSE TEST STRIPS) STRP True Result test strips, to test blood glucose 3 times daily, use as directed 100 each 5   insulin glargine (LANTUS SOLOSTAR) 100 UNIT/ML Solostar Pen Inject 3 Units into the skin daily. (Discard after 28 days of opening) 15 mL 2   metFORMIN (GLUCOPHAGE-XR) 500 MG 24 hr tablet Take 2 tablets (1,000 mg total) by mouth in the morning and  at bedtime. 120 tablet 3   TRUEPLUS LANCETS 30G MISC USE AS DIRECTED 100 each 11   No current facility-administered medications on file prior to visit.    Review of Systems  Constitutional:  Negative for chills and fever.  Respiratory:  Negative for cough.   Cardiovascular:  Negative for chest pain and palpitations.  Gastrointestinal:  Negative for nausea and vomiting.      Objective:    BP 118/70   Pulse 87   Temp 97.8 F (36.6 C) (Oral)   Ht 5\' 9"  (1.753 m)   Wt 144 lb 9.6 oz (65.6 kg)   SpO2 98%   BMI 21.35 kg/m  BP Readings from Last 3 Encounters:  02/23/23 118/70  01/12/23 118/70  11/17/22 112/80   Wt Readings from Last 3 Encounters:  02/23/23 144 lb 9.6 oz (65.6 kg)  01/12/23 145  lb (65.8 kg)  11/17/22 141 lb 6.4 oz (64.1 kg)    Physical Exam Vitals reviewed.  Constitutional:      Appearance: He is well-developed.  Cardiovascular:     Rate and Rhythm: Regular rhythm.     Heart sounds: Normal heart sounds.  Pulmonary:     Effort: Pulmonary effort is normal. No respiratory distress.     Breath sounds: Normal breath sounds. No wheezing, rhonchi or rales.  Skin:    General: Skin is warm and dry.  Neurological:     Mental Status: He is alert.  Psychiatric:        Speech: Speech normal.        Behavior: Behavior normal.

## 2023-02-23 NOTE — Assessment & Plan Note (Addendum)
Lab Results  Component Value Date   HGBA1C 7.3 (A) 02/23/2023   Improved.  Discussed goal of A1c for him may be closer to 7.   Reviewed CGM report.  Active 88% of the time.  Average glucose 150, glucose management indicator 6.9%, glucose variability 30.7%   I identified a low on August 4 at 6 PM, August 5 10 PM, August 7 at 8 AM.  We discussed concern for hypoglycemic episodes however he is not having hypoglycemic symptoms.    Advised him to use glucometer and check to see if actually experiencing a low with the [place].Advised him to go ahead and decrease glipizide to 5 mg with breakfast and with supper.  He will hold this medication with a low carbohydrate meal or if he were to skip a meal.   Continue metformin 2000mg  every day. He is taking lantus 3units qam every other day which is likely ineffective due to half life of medication ; I question if Lantus is effective at such a low dose.  I placed a referral to pharmacy for further advice in regards to titration of medications.

## 2023-02-26 ENCOUNTER — Telehealth: Payer: Self-pay

## 2023-02-26 NOTE — Progress Notes (Signed)
   Care Guide Note  02/26/2023 Name: MARTAVIOUS MIERA MRN: [id number] DOB: 03/13/1966  Referred by: [name], FNP Reason for referral : Care Management (Outreach to schedule with pharm d )   TOR SAMMON is a 57 y.o. year old male who is a primary care patient of [name], FNP. Kallie Edward Vanhoose was referred to the pharmacist for assistance related to DM.    Successful contact was made with the patient to discuss pharmacy services including being ready for the pharmacist to call at least 5 minutes before the scheduled appointment time, to have medication bottles and any blood sugar or blood pressure readings ready for review. The patient agreed to meet with the pharmacist via with the pharmacist via telephone visit on (date/time).  04/06/2023  Penne Lash, RMA Care Guide [facility]  [place], [place] [address] Direct Dial: [phone] [email or web address]

## 2023-03-06 ENCOUNTER — Other Ambulatory Visit (HOSPITAL_COMMUNITY): Payer: Self-pay

## 2023-03-06 ENCOUNTER — Other Ambulatory Visit: Payer: Self-pay | Admitting: Family

## 2023-03-06 DIAGNOSIS — E1165 Type 2 diabetes mellitus with hyperglycemia: Secondary | ICD-10-CM

## 2023-03-06 MED ORDER — METFORMIN HCL ER 500 MG PO TB24
1000.0000 mg | ORAL_TABLET | Freq: Two times a day (BID) | ORAL | 3 refills | Status: DC
  Filled 2023-03-06: qty 120, 30d supply, fill #0
  Filled 2023-04-02: qty 120, 30d supply, fill #1
  Filled 2023-05-07: qty 120, 30d supply, fill #2
  Filled 2023-06-06: qty 120, 30d supply, fill #3

## 2023-03-07 ENCOUNTER — Other Ambulatory Visit (HOSPITAL_COMMUNITY): Payer: Self-pay

## 2023-03-22 NOTE — Progress Notes (Signed)
  [name] [address] [place] [address] Phone: [phone] Subjective:   Jim [name], am serving as a scribe for Dr. Antoine Primas.  I'm seeing this patient by the request  of:  [name], FNP  CC: back and neck pain follow up   [id number]  Jim [name] is a 57 y.o. male coming in with complaint of back and neck pain. OMT 01/12/2023. Patient states   Medications patient has been prescribed: None  Taking:         Reviewed prior external information including notes and imaging from previsou exam, outside providers and external EMR if available.   As well as notes that were available from care everywhere and other healthcare systems.  Past medical history, social, surgical and family history all reviewed in electronic medical record.  No pertanent information unless stated regarding to the chief complaint.   Past Medical History:  Diagnosis Date   Diabetes mellitus 2012   GERD (gastroesophageal reflux disease)     Allergies  Allergen Reactions   Jardiance [Empagliflozin]     Fatigue, weight loss   Ciprofloxacin Rash     Review of Systems:  No headache, visual changes, nausea, vomiting, diarrhea, constipation, dizziness, abdominal pain, skin rash, fevers, chills, night sweats, weight loss, swollen lymph nodes, body aches, joint swelling, chest pain, shortness of breath, mood changes. POSITIVE muscle aches  Objective  Blood pressure 110/72, pulse 97, height 5\' 9"  (1.753 m), weight 142 lb (64.4 kg), SpO2 97%.   General: No apparent distress alert and oriented x3 mood and affect normal, dressed appropriately.  HEENT: Pupils equal, extraocular movements intact  Respiratory: Patient's speak in full sentences and does not appear short of breath  Cardiovascular: No lower extremity edema, non tender, no erythema  MSK:  Back does have loss of lordosis discussed which activities to do and which ones to avoid.  Tightness of left piriformis    Osteopathic findings  C3 flexed rotated and side bent right C6 flexed rotated and side bent left T3 extended rotated and side bent right inhaled rib T5 extended rotated and side bent left L2 flexed rotated and side bent right Sacrum right on right       Assessment and Plan:  Lumbar radiculopathy Continues to have intermittent back pain.  Still taking care of his ailing wife as well which is caused him not to have as much time at the moment.  Discussed icing regimen and home exercises, discussed which activities to do and which ones to avoid.  Increase activity slowly otherwise.  Discussed icing regimen and home exercises.  Follow-up with me again in 6 to 8 weeks.      Nonallopathic problems  Decision today to treat with OMT was based on Physical Exam  After verbal consent patient was treated with HVLA, ME, FPR techniques in cervical, rib, thoracic, lumbar, and sacral  areas  Patient tolerated the procedure well with improvement in symptoms  Patient given exercises, stretches and lifestyle modifications  See medications in patient instructions if given  Patient will follow up in 4-8 weeks    The above documentation has been reviewed and is accurate and complete [name], DO          Note: This dictation was prepared with Dragon dictation along with smaller phrase technology. Any transcriptional errors that result from this process are unintentional.

## 2023-03-23 ENCOUNTER — Ambulatory Visit (INDEPENDENT_AMBULATORY_CARE_PROVIDER_SITE_OTHER): Payer: No Typology Code available for payment source | Admitting: Family Medicine

## 2023-03-23 ENCOUNTER — Encounter: Payer: Self-pay | Admitting: Family Medicine

## 2023-03-23 VITALS — BP 110/72 | HR 97 | Ht 69.0 in | Wt 142.0 lb

## 2023-03-23 DIAGNOSIS — M9908 Segmental and somatic dysfunction of rib cage: Secondary | ICD-10-CM | POA: Diagnosis not present

## 2023-03-23 DIAGNOSIS — M9904 Segmental and somatic dysfunction of sacral region: Secondary | ICD-10-CM

## 2023-03-23 DIAGNOSIS — M9903 Segmental and somatic dysfunction of lumbar region: Secondary | ICD-10-CM | POA: Diagnosis not present

## 2023-03-23 DIAGNOSIS — M9902 Segmental and somatic dysfunction of thoracic region: Secondary | ICD-10-CM

## 2023-03-23 DIAGNOSIS — M9901 Segmental and somatic dysfunction of cervical region: Secondary | ICD-10-CM | POA: Diagnosis not present

## 2023-03-23 DIAGNOSIS — M5416 Radiculopathy, lumbar region: Secondary | ICD-10-CM | POA: Diagnosis not present

## 2023-03-23 NOTE — Patient Instructions (Signed)
Great to see you Hope travels go well Tell better half hi See me in 7-8 weeks

## 2023-03-23 NOTE — Assessment & Plan Note (Signed)
Continues to have intermittent back pain.  Still taking care of his ailing wife as well which is caused him not to have as much time at the moment.  Discussed icing regimen and home exercises, discussed which activities to do and which ones to avoid.  Increase activity slowly otherwise.  Discussed icing regimen and home exercises.  Follow-up with me again in 6 to 8 weeks.

## 2023-04-04 ENCOUNTER — Other Ambulatory Visit (HOSPITAL_COMMUNITY): Payer: Self-pay

## 2023-04-06 ENCOUNTER — Other Ambulatory Visit: Payer: No Typology Code available for payment source | Admitting: Pharmacist

## 2023-04-09 ENCOUNTER — Other Ambulatory Visit: Payer: Self-pay | Admitting: Family

## 2023-04-09 ENCOUNTER — Other Ambulatory Visit (HOSPITAL_COMMUNITY): Payer: Self-pay

## 2023-04-09 MED ORDER — FREESTYLE LIBRE 3 SENSOR MISC
1.0000 | 3 refills | Status: DC
  Filled 2023-04-09: qty 2, 28d supply, fill #0
  Filled 2023-05-14: qty 2, 28d supply, fill #1
  Filled 2023-06-07: qty 2, 28d supply, fill #2
  Filled 2023-07-19: qty 2, 28d supply, fill #3

## 2023-04-11 ENCOUNTER — Other Ambulatory Visit (HOSPITAL_COMMUNITY): Payer: Self-pay

## 2023-04-13 ENCOUNTER — Encounter: Payer: Self-pay | Admitting: Pharmacist

## 2023-04-13 ENCOUNTER — Other Ambulatory Visit: Payer: No Typology Code available for payment source | Admitting: Pharmacist

## 2023-04-13 NOTE — Patient Instructions (Addendum)
Mr. Jim [name] [facility],   It was a pleasure to see you today! As we discussed:?   Continue your current medications  Over the next week, we discussed holding the Lantus for a 3-5 day stretch while wearing your Libre device to see if this helps nighttime sugars stay in a safer range.   We also discussed continuing to work toward high protein meals for lunch dinner (lean meat such as roast chicken/fish).  We will touch base via a quick phone check in on Monday Oct 7th to review the [facility] data, and to discuss the possibility of a medication that would be safer and may help more with after-meal sugars (such as Januvia).   We also talked about cardiovascular risk today including methods to reduce your risk as much as possible.  Based on the American Heart Association:     - This individual has an estimated 10-year risk of CVD = 8.4%    - This individual has an estimated 30-year risk of CVD = 33.2%  Interpretation of Risk Estimates: Low risk (<5%) Borderline risk (5% to 7.4%) Intermediate risk (7.5% to 19.9%) High risk (>=20%)  Follow up with clinical pharmacist via telephone on 04/24/23 around 9:30 am.   Berenice Primas, PharmD - Clinical Pharmacist

## 2023-04-13 NOTE — Progress Notes (Signed)
04/13/2023 Name: Jim [name] MRN: [id number] DOB: [birth year]  Subjective  Chief Complaint  Patient presents with   Diabetes    Reason for visit: Jim [name] is a 57 y.o. year old male who presented for a telephone visit.   They were referred to the pharmacist by their PCP for assistance in managing diabetes.   Care Team: Primary Care Provider: [name], FNP  Reason for visit: ?  Jim [name] is a 57 y.o. male with a history of diabetes (type 2), who presents today for a diabetes pharmacotherapy visit.? Pertinent PMH also includes HLD.   Known DM Complications: no known complications   Date of Last Diabetes Related Visit: 02/23/23 with PCP   Action At Last Diabetes Related Visit: ?  CGM report. Active 88%; Avg 150 mg/dL, GMI 4.0%, glucose variability 30.7%  Identified low on 8/4 at 6 PM, 8/5 10 PM, 8/ 7 at 8 AM.  Not having hypoglycemic symptoms. Advised him to use glucometer and check to see if actually experiencing a low with the [place]. Decrease glipizide to 5 mg with breakfast and with supper.  Hold if low carb meal/skipped meal.   Prescription drug coverage: Payor: [employer] / Plan: [place] PREFERRED / Product Type: *No Product type* / . Reports that all medications are  affordable.  Current Patient Assistance: None    Since Last visit / History of Present Illness: ?  Patient reports implementing plan from last visit, halved glipizide dose (now taking 5 mg BID denies having to skip any doses for low-carb/skipped meals), and is taking Lantus 3 units every day now rather than every other day. Denies adverse effects with current medications.   Reported DM Regimen: ?  Metformin XR 2000 mg daily Glipidize 5 mg BID (hold if low carb or skipped meal) Lantus 3 units every day (morning)   DM medications tried in the past:?  Jardiance (Fatigue) Mounjaro (Side effects; weight loss/stomach upset) Trulicity (Taking for a couple of years, dc due to increased side  effects/weight loss)  Reported Diet: Patient typically eats 3 meals per day.  Breakfast (6 am): [name] croissant  Snack (10 am work days): Coffee with flavored creamer, pack of nabs Lunch (1-2 pm): Chicken salad sandwich, [employer] frozen meal Dinner: Chicken/fish + vegetable, occasionally rice; Out to eat ~2-3 times per week (splits meal with wife, aims for higher protein, lower carb options) Beverages: Water with dinner; Mid-day drink mix ([employer] - caffeine/sugar free, uses only part of the mix); Coffee; Diet soda  Nighttime snack: Almonds, dark chocolate almonds, [employer] fudge bars   SMBG  CGM : ? Libre 3 (Connected to [employer]); Has been traveling and has not been wearing a sensor the past ~5 days. Libre Report (Date Range 03/26/23 - 04/08/23) ABG 180 mg/dL; GMI 9.8%; TIR 11%, high 35%, very high 10%, low 0%, Variability 28%     9/19: Traveling, less consistent food choices, cannot recall specifics.  9/20: Work dinner (reports high protein, no carbs - roasted chicken, tender steak)  Overall, patient thinks that blood sugars are unchanged since last diabetes related visit.    Hypo/Hyperglycemia: ?  Symptoms of hypoglycemia since last visit:? no (Does report history of lows many years ago, reports cold sweat) If yes, it was treated by: n/a  Symptoms of hyperglycemia since last visit:? no - none  DM Prevention:  Statin: Not taking;  Has declined previously .?  History of chronic kidney disease? no History of albuminuria? no, last  UACR on 09/06/22 = 0.8 mg/g ACE/ARB - Not taking; Urine MA/CR Ratio - normal.  Last eye exam: Last was a few months ago 2024 Last foot exam: 08/18/2022 Tobacco Use: Never smoker Immunizations:? Flu: Due, Pneumococcal: Up to Date:(PCV20 01/2022; PPSV23 02/2013); Shingrix: Due, Covid (due, last 2021)  Cardiovascular Risk Reduction History of clinical ASCVD? no The 10-year ASCVD risk score (Arnett DK, et al., 2019) is: 8.3% AHA PREVENT  Calculator: 10-year risk of CVD = 8.4%, 30-year risk of CVD = 33.2% History of heart failure? no None History of hyperlipidemia? yes Current BMI: 20.97 kg/m2 (Ht 5'9" in, Wt 64.4 kg) Taking statin? declines; n/a (read about side effects) Taking aspirin? not indicated; Not taking   Taking SGLT-2i? no Taking GLP- 1 RA? no      _______________________________________________  Objective    Review of Systems:?  Constitutional:? No fever, chills or unintentional weight loss  Cardiovascular:? No chest pain or pressure, shortness of breath, dyspnea on exertion, orthopnea or LE edema  Pulmonary:? No cough or shortness of breath  GI:? No nausea, vomiting, constipation, diarrhea, abdominal pain, dyspepsia, change in bowel habits  Endocrine:? No polyuria, polyphagia or blurred vision  Psych:? No depression, anxiety, insomnia    Physical Examination:  Vitals:  Wt Readings from Last 3 Encounters:  03/23/23 142 lb (64.4 kg)  02/23/23 144 lb 9.6 oz (65.6 kg)  01/12/23 145 lb (65.8 kg)   BP Readings from Last 3 Encounters:  03/23/23 110/72  02/23/23 118/70  01/12/23 118/70   Pulse Readings from Last 3 Encounters:  03/23/23 97  02/23/23 87  01/12/23 (!) 104   The 10-year ASCVD risk score (Arnett DK, et al., 2019) is: 8.3%   Values used to calculate the score:     Age: 51 years     Sex: Male     Is Non-Hispanic African American: No     Diabetic: Yes     Tobacco smoker: No     Systolic Blood Pressure: 110 mmHg     Is BP treated: No     HDL Cholesterol: 39.5 mg/dL     Total Cholesterol: 170 mg/dL   Labs:?  Lab Results  Component Value Date   HGBA1C 7.3 (A) 02/23/2023   HGBA1C 8.1 (A) 11/17/2022   HGBA1C 8.6 (A) 08/18/2022   GLUCOSE 234 (H) 09/06/2022   MICRALBCREAT 0.8 09/06/2022   MICRALBCREAT 1.0 07/19/2021   MICRALBCREAT 0.8 09/03/2020   CREATININE 0.96 09/06/2022   CREATININE 0.87 06/27/2021   CREATININE 0.86 10/20/2020   GFRNONAA >60 10/20/2020   GFRNONAA >60  03/06/2011   GFRNONAA >60 03/05/2011    Lab Results  Component Value Date   CHOL 170 09/06/2022   LDLDIRECT 91.0 09/06/2022   HDL 39.50 09/06/2022   HDL 41.70 10/28/2021   HDL 37.50 (L) 09/03/2020   AST 17 09/06/2022   AST 15 06/27/2021   ALT 29 09/06/2022   ALT 19 06/27/2021      Chemistry      Component Value Date/Time   NA 142 09/06/2022 0733   K 4.7 09/06/2022 0733   CL 103 09/06/2022 0733   CO2 22 09/06/2022 0733   BUN 18 09/06/2022 0733   CREATININE 0.96 09/06/2022 0733      Component Value Date/Time   CALCIUM 9.9 09/06/2022 0733   ALKPHOS 67 09/06/2022 0733   AST 17 09/06/2022 0733   ALT 29 09/06/2022 0733   BILITOT 0.3 09/06/2022 0733     The 10-year ASCVD risk score (Arnett DK,  et al., 2019) is: 8.3%  Assessment and Plan:   1. Diabetes, type 2 (since 2012): Uncontrolled per last A1c of 7.3% (02/23/23) with hypoglycemia episodes.  Goal <7% without hypoglycemia. No CGM data over the past week due to not wearing sensor. Available CGM data shows ABG 180 mg/dL; GMI 6.4%; TIR 40%, high 35%, very high 10%, low 0%, Variability 28%?Marland Kitchen Avg BG decreased on 7-day report (184 > 176 mg/dL). Continues to approach hypoglycemia somewhat regularly overnight (see image above). We discussed dietary changes to stabilize BG overnight. Also discussed that a trial of several days with no insulin would be beneficial in seeing how sugars are affected given only taking 3 units. Patient is open to trialing DPP4 to help with PP hyperglycemia which we will consider after ~1 week with trial off of insulin.  Current Regimen: Metfromin XR 2000 mg daily, Glipizide 5 mg BID, Lantus 3 units daily (am) Diet: Discussed setting goal of increasing protein with lunch/dinner meals given great evidence of BG stabilization on his Josephine Igo report today w roast chicken dinner Exercise: N/A; Discuss in more detail at next visit  Continue medications today  -  Over the next week, recommended patient trial a span of 3-5  days with NO INSULIN. We will review Josephine Igo data together in ~1 week and discuss possible stopping insulin, consider DPP4i Reviewed signs/symptoms/treatment of hypoglycemia  Next A1c due ~05/2023-08/2023 given relatively controlled Future Consideration: GLP1-RA: Previous stomach upset on Trulicity and Mounjaro. Also prefers to avoid weight loss. Reasonable to consider Ozempic in the future (microdosing)? SGLT2i: Fatigue on Jardiance. Marcelline Deist reasonable to consider if patient amenable.  TZD: Decent option though long-standing diabetes since 2012.  Insulin: Likely not needed at this time given 3 units daily with borderline hypoglycemia overnight regularly. Patient agreeable to tentative plan to discontinue.    2. ASCVD Risk Reduction (primary prevention): The 10-year ASCVD risk score (Arnett DK, et al., 2019) is: 8.3% and lipid panel on 09/06/22 with LDL of 91 mg/dL indicates patient is at moderate risk of ASCVD.  Family History CVD: Oldest brother: Multiple heart attacks, CABG; Middle brother: MI w stent  Key risk factors include: Diabetes, Hyperlipidemia Current Regimen: None. Continue medications today without changes.  Future Consideration: Reviewed ASCVD risk score with patient, and discussed general risk in persons with diabetes compared to those without. We did discuss statin therapy from a perspective of risk-reduction and incidence of true myopathy. We has a long discussion regarding risk/benefit (I.e. 8% chance of MI/stroke in the next 68yr vs <1% change of having muscle-related adverse effect on a statin). Reviewed hydrophylic statins such as rosuvastatin/Crestor having very low change of muscle symptoms. Patient is open to considering cholesterol medication and will do some more research on his end.  Consider Rosuvastatin 10 mg daily  4. Healthcare Maintenance:  Pneumococcal - Current status: Up to date (PCV20 01/2022; PPSV23 02/2013)  Shingles - Current status: Due (no record) Influenza -  Current status: Due (no record)  Due to receive the following vaccines: Tdap, Influenza, Shingrix, and Covid Booster   Follow Up Follow up with clinical pharmacist via phone in 1 week to review more current [place] data and to discuss readiness regarding possible medication adjustment. .  ?    Future Appointments  Date Time Provider Department Center  04/23/2023  9:30 AM [facility] PHARMACIST [facility]  05/18/2023  8:00 AM [name], DO [facility] None  06/01/2023  8:00 AM Arnett, Lyn Records, FNP [facility]  Loree Fee, PharmD Clinical Pharmacist [facility] [phone]

## 2023-04-23 ENCOUNTER — Other Ambulatory Visit: Payer: No Typology Code available for payment source

## 2023-04-27 ENCOUNTER — Other Ambulatory Visit: Payer: No Typology Code available for payment source | Admitting: Pharmacist

## 2023-04-27 DIAGNOSIS — E1165 Type 2 diabetes mellitus with hyperglycemia: Secondary | ICD-10-CM

## 2023-04-27 NOTE — Progress Notes (Addendum)
04/27/2023 Name: Jim [name] MRN: [id number] DOB: 01/13/1966  Subjective  No chief complaint on file.   Reason for visit: Jim [name] is a 57 y.o. year old male who presented for a telephone visit.   They were referred to the pharmacist by their PCP for assistance in managing diabetes.   Care Team: Primary Care Provider: [name], FNP  Reason for visit: ?  Jim [name] is a 57 y.o. male with a history of diabetes (type 2), who presents today for a diabetes pharmacotherapy visit.? Pertinent PMH also includes HLD.   Known DM Complications: no known complications  Date of Last Diabetes Related Visit: 02/23/23 with PCP ; 04/13/23 with Pharmacist Action At Last Diabetes Related Visit: ?  04/13/23: No CGM data, not wearing sensor. Available CGM data shows ABG 180 mg/dL; GMI 9.5%; TIR 63%, high 35%, very high 10%, low 0%, Variability 28%. Avg BG decreased on 7-day report (184 > 176 mg/dL). Continues to approach hypoglycemia somewhat regularly overnight (see image above). We discussed dietary changes to stabilize BG overnight. Also discussed that a trial of several days with no insulin would be beneficial in seeing how sugars are affected given only taking 3 units. Patient is open to trialing DPP4 to help with PP hyperglycemia which we will consider after ~1 week with trial off of insulin. 02/23/23: Decrease glipizide to 5 mg with breakfast and with supper. Hold if low carb meal/skipped meal.   Prescription drug coverage: Payor: [employer] / Plan: [place] PREFERRED / Product Type: *No Product type* / . Reports that all medications are  affordable.  Current Patient Assistance: None   Since Last visit / History of Present Illness: ?  Patient reports partially implementing plan from last visit. Has continued all medications without changes. Has not trialed off of Lantus 3 units because he was traveling for work this past week and noted higher sugars.   Reported DM Regimen: ?   Metformin XR 2000 mg daily Glipidize 5 mg BID (hold if low carb or skipped meal) Lantus 3 units every day (morning)   DM medications tried in the past:?  Jardiance (Fatigue) Mounjaro (Side effects; weight loss/stomach upset) Trulicity (Taking for a couple of years, dc due to increased side effects/weight loss)  Reported Diet: Patient typically eats 3 meals per day.  Breakfast (6 am): [name] croissant  Snack (10 am work days): Coffee with flavored creamer, pack of nabs Lunch (1-2 pm): Chicken salad sandwich, [employer] frozen meal Dinner: Chicken/fish + vegetable, occasionally rice; Out to eat ~2-3 times per week (splits meal with wife, aims for higher protein, lower carb options) Beverages: Water with dinner; Mid-day drink mix ([employer] - caffeine/sugar free, uses only part of the mix); Coffee; Diet soda  Nighttime snack: Almonds, dark chocolate almonds, [employer] fudge bars.    SMBG -  CGM : ? Libre 3 (Connected to [employer])  CGM Report (Date Range 9/28 - 10/11) ABG 206 mg/dL; GMI 8.7%; Variability 26.9%; TIR 35%, high 45%, very high 20%, low 0%, very low 0%  10/10: Returned home from traveling ~2 am. 10/10 is reflective of usual diet at home.     Hypo/Hyperglycemia: ?  Symptoms of hypoglycemia since last visit:? no (Does report history of lows many years ago, reports cold sweat) If yes, it was treated by: n/a  Symptoms of hyperglycemia since last visit:? no - none  DM Prevention:  Statin: Not taking;  Has declined previously .?  History of chronic kidney  disease? no History of albuminuria? no, last UACR on 09/06/22 = 0.8 mg/g ACE/ARB - Not taking; Urine MA/CR Ratio - normal.  Last eye exam: Last was a few months ago 2024 Last foot exam: 08/18/2022 Tobacco Use: Never smoker Immunizations:? Flu: Due, Pneumococcal: Up to Date:(PCV20 01/2022; PPSV23 02/2013); Shingrix: Due, Covid (due, last 2021)  Cardiovascular Risk Reduction History of clinical ASCVD? no The  10-year ASCVD risk score (Arnett DK, et al., 2019) is: 8.3% AHA PREVENT Calculator: 10-year risk of CVD = 8.4%, 30-year risk of CVD = 33.2% History of heart failure? no None History of hyperlipidemia? yes Current BMI: 20.97 kg/m2 (Ht 5'9" in, Wt 64.4 kg) Taking statin? declines; n/a (read about side effects) Taking aspirin? not indicated; Not taking   Taking SGLT-2i? no Taking GLP- 1 RA? no      _______________________________________________  Objective    Review of Systems:?  Constitutional:? No fever, chills or unintentional weight loss  Cardiovascular:? No chest pain or pressure, shortness of breath, dyspnea on exertion, orthopnea or LE edema  Pulmonary:? No cough or shortness of breath  GI:? No nausea, vomiting, constipation, diarrhea, abdominal pain, dyspepsia, change in bowel habits  Endocrine:? No polyuria, polyphagia or blurred vision  Psych:? No depression, anxiety, insomnia    Physical Examination:  Vitals:  Wt Readings from Last 3 Encounters:  03/23/23 142 lb (64.4 kg)  02/23/23 144 lb 9.6 oz (65.6 kg)  01/12/23 145 lb (65.8 kg)   BP Readings from Last 3 Encounters:  03/23/23 110/72  02/23/23 118/70  01/12/23 118/70   Pulse Readings from Last 3 Encounters:  03/23/23 97  02/23/23 87  01/12/23 (!) 104   The 10-year ASCVD risk score (Arnett DK, et al., 2019) is: 8.3%   Values used to calculate the score:     Age: 23 years     Sex: Male     Is Non-Hispanic African American: No     Diabetic: Yes     Tobacco smoker: No     Systolic Blood Pressure: 110 mmHg     Is BP treated: No     HDL Cholesterol: 39.5 mg/dL     Total Cholesterol: 170 mg/dL   Labs:?  Lab Results  Component Value Date   HGBA1C 7.3 (A) 02/23/2023   HGBA1C 8.1 (A) 11/17/2022   HGBA1C 8.6 (A) 08/18/2022   GLUCOSE 234 (H) 09/06/2022   MICRALBCREAT 0.8 09/06/2022   MICRALBCREAT 1.0 07/19/2021   MICRALBCREAT 0.8 09/03/2020   CREATININE 0.96 09/06/2022   CREATININE 0.87 06/27/2021    CREATININE 0.86 10/20/2020   GFRNONAA >60 10/20/2020   GFRNONAA >60 03/06/2011   GFRNONAA >60 03/05/2011    Lab Results  Component Value Date   CHOL 170 09/06/2022   LDLDIRECT 91.0 09/06/2022   HDL 39.50 09/06/2022   HDL 41.70 10/28/2021   HDL 37.50 (L) 09/03/2020   AST 17 09/06/2022   AST 15 06/27/2021   ALT 29 09/06/2022   ALT 19 06/27/2021      Chemistry      Component Value Date/Time   NA 142 09/06/2022 0733   K 4.7 09/06/2022 0733   CL 103 09/06/2022 0733   CO2 22 09/06/2022 0733   BUN 18 09/06/2022 0733   CREATININE 0.96 09/06/2022 0733      Component Value Date/Time   CALCIUM 9.9 09/06/2022 0733   ALKPHOS 67 09/06/2022 0733   AST 17 09/06/2022 0733   ALT 29 09/06/2022 0733   BILITOT 0.3 09/06/2022 4098  The 10-year ASCVD risk score (Arnett DK, et al., 2019) is: 8.3%  Assessment and Plan:   1. Diabetes, type 2 (since 2012): Uncontrolled per last A1c of 7.3% (02/23/23) with hypoglycemia episodes.  Goal <7% without hypoglycemia. CGM data over the past 2 weeks shows elevated sugars 2/2 traveling for work/eating on the road. BG normalized upon returning home, again approaching 70 mg/dL over night. Hyperglycemia continues to be concentrated surrounding breakfast/lunch. We reviewed DPP4i as an option to help more with post-prandial sugars. As overnight/FBG remain low on days not traveling, would recommend stopping insulin once starting DPP4i (also suspect he is not getting significant benefit from 3 unit dose). Will discuss with PCP. Previously c/f weight loss on GLP1RA, much less profound with DPP4i, though will monitor appetite/weight. Current Regimen: Metformin XR 2000 mg daily, Glipizide 5 mg BID, Lantus 3 units daily (am) Start Januvia (DPP4i) 100 mg once daily Stop Lantus 3 units once taking Januvia  Diet: Discussed setting goal of increasing protein with lunch/breakfast and reducing carbs with breakfast. Clear reduction in BG after dinner with glipizide dose,  though no change in BG at breakfast/lunch despite glipizide likely due to carb content of meals. Dietary indiscretion with travel is a large barrier to glycemic control at this time. Focus on healthier choices with travel.  Exercise: N/A; Discuss in more detail at next visit  Will review LibreView data in ~2 weeks Reviewed signs/symptoms/treatment of hypoglycemia Next A1c due ~05/2023-08/2023 Future Consideration: If continued overnight lows off of insulin, consider stopping glipizide with dinner meal (eg glipizide 5-10 mg breakfast only), or transition to ER glipizide qAM GLP1-RA: Previous stomach upset on Trulicity and Mounjaro. Also prefers to avoid weight loss. Reasonable to consider Ozempic in the future (microdosing)? SGLT2i: Fatigue on Jardiance. Marcelline Deist reasonable to consider if patient amenable.  TZD: Decent option though long-standing diabetes since 2012.  Insulin: Likely not needed at this time given 3 units daily with borderline hypoglycemia overnight regularly.  PA faxed 05/02/23 for Januvia 100 mg daily. PA code: [id number]   2. ASCVD Risk Reduction (primary prevention): The 10-year ASCVD risk score (Arnett DK, et al., 2019) is: 8.3% and lipid panel on 09/06/22 with LDL of 91 mg/dL indicates patient is at moderate risk of ASCVD.  Family History CVD: Oldest brother: Multiple heart attacks, CABG; Middle brother: MI w stent  Key risk factors include: Diabetes, Hyperlipidemia Current Regimen: None. Continue medications today without changes.  Future Consideration: Reviewed ASCVD risk score with patient, and discussed general risk in persons with diabetes compared to those without. We did discuss statin therapy from a perspective of risk-reduction and incidence of true myopathy. We has a long discussion regarding risk/benefit (I.e. 8% chance of MI/stroke in the next 14yr vs <1% change of having muscle-related adverse effect on a statin). Reviewed hydrophylic statins such as  rosuvastatin/Crestor having very low change of muscle symptoms. Patient is open to considering cholesterol medication and will do some more research on his end.  Consider Rosuvastatin 10 mg daily  4. Healthcare Maintenance:  Pneumococcal - Current status: Up to date (PCV20 01/2022; PPSV23 02/2013)  Shingles - Current status: Due (no record) Influenza - Current status: Due (no record)  Due to receive the following vaccines: Tdap, Influenza, Shingrix, and Covid Booster   Follow Up Follow up scheduled with PCP 1 month Clinical pharmacist to review LibreView data in 2 weeks. Based on results, will f/u via telephone visit in 2 weeks. Follow up scheduled ~4 weeks after PCP visit.?   Future  Appointments  Date Time Provider Department Center  05/18/2023  8:00 AM [name], DO [facility] None  06/01/2023  8:00 AM [name], FNP [facility]  06/29/2023 10:15 AM [facility] PHARMACIST [facility]   Loree Fee, PharmD Clinical Pharmacist [facility] [phone]

## 2023-05-02 ENCOUNTER — Other Ambulatory Visit (HOSPITAL_COMMUNITY): Payer: Self-pay

## 2023-05-02 ENCOUNTER — Other Ambulatory Visit: Payer: Self-pay | Admitting: Family

## 2023-05-02 DIAGNOSIS — E1165 Type 2 diabetes mellitus with hyperglycemia: Secondary | ICD-10-CM

## 2023-05-02 MED ORDER — SITAGLIPTIN PHOSPHATE 100 MG PO TABS
100.0000 mg | ORAL_TABLET | Freq: Every day | ORAL | 3 refills | Status: DC
Start: 2023-05-02 — End: 2023-06-22
  Filled 2023-05-02: qty 30, 30d supply, fill #0
  Filled 2023-06-06: qty 30, 30d supply, fill #1

## 2023-05-02 NOTE — Progress Notes (Signed)
Agree with plan. Stop basal insulin. Start januvia 100mg  every day. [name], NP

## 2023-05-07 ENCOUNTER — Other Ambulatory Visit (HOSPITAL_COMMUNITY): Payer: Self-pay

## 2023-05-08 ENCOUNTER — Other Ambulatory Visit (HOSPITAL_COMMUNITY): Payer: Self-pay

## 2023-05-09 ENCOUNTER — Other Ambulatory Visit (HOSPITAL_COMMUNITY): Payer: Self-pay

## 2023-05-14 ENCOUNTER — Other Ambulatory Visit: Payer: Self-pay

## 2023-05-16 ENCOUNTER — Other Ambulatory Visit (HOSPITAL_COMMUNITY): Payer: Self-pay

## 2023-05-17 NOTE — Progress Notes (Deleted)
  [name] [address] [place] [address] Phone: [phone] Subjective:    I'm seeing this patient by the request  of:  [name], FNP  CC:   [id number]  Jim [name] is a 57 y.o. male coming in with complaint of back and neck pain. OMT 03/23/2023. Patient states   Medications patient has been prescribed: None  Taking:         Reviewed prior external information including notes and imaging from previsou exam, outside providers and external EMR if available.   As well as notes that were available from care everywhere and other healthcare systems.  Past medical history, social, surgical and family history all reviewed in electronic medical record.  No pertanent information unless stated regarding to the chief complaint.   Past Medical History:  Diagnosis Date   Diabetes mellitus 2012   GERD (gastroesophageal reflux disease)     Allergies  Allergen Reactions   Jardiance [Empagliflozin]     Fatigue, weight loss   Ciprofloxacin Rash     Review of Systems:  No headache, visual changes, nausea, vomiting, diarrhea, constipation, dizziness, abdominal pain, skin rash, fevers, chills, night sweats, weight loss, swollen lymph nodes, body aches, joint swelling, chest pain, shortness of breath, mood changes. POSITIVE muscle aches  Objective  There were no vitals taken for this visit.   General: No apparent distress alert and oriented x3 mood and affect normal, dressed appropriately.  HEENT: Pupils equal, extraocular movements intact  Respiratory: Patient's speak in full sentences and does not appear short of breath  Cardiovascular: No lower extremity edema, non tender, no erythema  Gait MSK:  Back   Osteopathic findings  C2 flexed rotated and side bent right C6 flexed rotated and side bent left T3 extended rotated and side bent right inhaled rib T9 extended rotated and side bent left L2 flexed rotated and side bent  right Sacrum right on right       Assessment and Plan:  No problem-specific Assessment & Plan notes found for this encounter.    Nonallopathic problems  Decision today to treat with OMT was based on Physical Exam  After verbal consent patient was treated with HVLA, ME, FPR techniques in cervical, rib, thoracic, lumbar, and sacral  areas  Patient tolerated the procedure well with improvement in symptoms  Patient given exercises, stretches and lifestyle modifications  See medications in patient instructions if given  Patient will follow up in 4-8 weeks             Note: This dictation was prepared with Dragon dictation along with smaller phrase technology. Any transcriptional errors that result from this process are unintentional.

## 2023-05-18 ENCOUNTER — Ambulatory Visit: Payer: No Typology Code available for payment source | Admitting: Family Medicine

## 2023-05-22 ENCOUNTER — Ambulatory Visit: Payer: No Typology Code available for payment source | Admitting: Family Medicine

## 2023-05-29 ENCOUNTER — Other Ambulatory Visit (HOSPITAL_COMMUNITY): Payer: Self-pay

## 2023-06-01 ENCOUNTER — Ambulatory Visit: Payer: No Typology Code available for payment source | Admitting: Family

## 2023-06-07 ENCOUNTER — Other Ambulatory Visit (HOSPITAL_COMMUNITY): Payer: Self-pay

## 2023-06-22 ENCOUNTER — Ambulatory Visit (INDEPENDENT_AMBULATORY_CARE_PROVIDER_SITE_OTHER): Payer: No Typology Code available for payment source | Admitting: Family

## 2023-06-22 ENCOUNTER — Encounter: Payer: Self-pay | Admitting: Family

## 2023-06-22 ENCOUNTER — Other Ambulatory Visit (HOSPITAL_COMMUNITY): Payer: Self-pay

## 2023-06-22 VITALS — BP 118/68 | HR 98 | Temp 98.4°F | Ht 69.0 in | Wt 142.8 lb

## 2023-06-22 DIAGNOSIS — Z13220 Encounter for screening for lipoid disorders: Secondary | ICD-10-CM | POA: Diagnosis not present

## 2023-06-22 DIAGNOSIS — Z125 Encounter for screening for malignant neoplasm of prostate: Secondary | ICD-10-CM | POA: Diagnosis not present

## 2023-06-22 DIAGNOSIS — E1165 Type 2 diabetes mellitus with hyperglycemia: Secondary | ICD-10-CM

## 2023-06-22 DIAGNOSIS — Z136 Encounter for screening for cardiovascular disorders: Secondary | ICD-10-CM

## 2023-06-22 DIAGNOSIS — Z7984 Long term (current) use of oral hypoglycemic drugs: Secondary | ICD-10-CM | POA: Diagnosis not present

## 2023-06-22 DIAGNOSIS — R7309 Other abnormal glucose: Secondary | ICD-10-CM

## 2023-06-22 LAB — POCT GLYCOSYLATED HEMOGLOBIN (HGB A1C): Hemoglobin A1C: 8.2 % — AB (ref 4.0–5.6)

## 2023-06-22 MED ORDER — METFORMIN HCL ER 500 MG PO TB24
1000.0000 mg | ORAL_TABLET | Freq: Two times a day (BID) | ORAL | 3 refills | Status: DC
  Filled 2023-06-22 – 2023-07-06 (×2): qty 120, 30d supply, fill #0
  Filled 2023-08-05: qty 120, 30d supply, fill #1
  Filled 2023-09-03: qty 120, 30d supply, fill #2
  Filled 2023-10-03 – 2023-10-05 (×2): qty 120, 30d supply, fill #3
  Filled 2023-11-02: qty 120, 30d supply, fill #4
  Filled 2023-12-07: qty 120, 30d supply, fill #5
  Filled 2024-01-06: qty 120, 30d supply, fill #6
  Filled 2024-02-06: qty 120, 30d supply, fill #7
  Filled 2024-03-08: qty 120, 30d supply, fill #8
  Filled 2024-04-10: qty 120, 30d supply, fill #9
  Filled 2024-05-12: qty 120, 30d supply, fill #10

## 2023-06-22 MED ORDER — SITAGLIPTIN PHOSPHATE 100 MG PO TABS
100.0000 mg | ORAL_TABLET | Freq: Every day | ORAL | 3 refills | Status: DC
  Filled 2023-06-22: qty 30, 30d supply, fill #0

## 2023-06-22 NOTE — Assessment & Plan Note (Addendum)
Lab Results  Component Value Date   HGBA1C 8.2 (A) 06/22/2023   Uncontrolled.  Compliant with Januvia 100 mg, metformin 1000 mg twice daily, glipizide 5 mg twice daily.  He was interested in increasing glipizide.  A1c less than 9 and discussed concern with hypoglycemia with escalation glipizide.   Advised consideration for discontinuing Januvia and retrial of Lantus.  I have sent a note to pharmacy for further advice as well.

## 2023-06-22 NOTE — Progress Notes (Signed)
Assessment & Plan:  Elevated glucose -     POCT glycosylated hemoglobin (Hb A1C)  Type 2 diabetes mellitus with hyperglycemia, without long-term current use of insulin (HCC) Assessment & Plan: Lab Results  Component Value Date   HGBA1C 8.2 (A) 06/22/2023   Uncontrolled.  Compliant with Januvia 100 mg, metformin 1000 mg twice daily, glipizide 5 mg twice daily.  He was interested in increasing glipizide.  A1c less than 9 and discussed concern with hypoglycemia with escalation glipizide.   Advised consideration for discontinuing Januvia and retrial of Lantus.  I have sent a note to pharmacy for further advice as well.  Orders: -     CBC with Differential/Platelet; Future -     Comprehensive metabolic panel; Future -     Hemoglobin A1c; Future -     Lipid panel; Future -     TSH; Future -     VITAMIN D 25 Hydroxy (Vit-D Deficiency, Fractures); Future -     PSA; Future -     Microalbumin / creatinine urine ratio; Future -     SITagliptin Phosphate; Take 1 tablet (100 mg total) by mouth daily.  Dispense: 90 tablet; Refill: 3 -     metFORMIN HCl ER; Take 2 tablets (1,000 mg total) by mouth in the morning and at bedtime.  Dispense: 360 tablet; Refill: 3  Encounter for lipid screening for cardiovascular disease -     Lipid panel; Future  Screening for prostate cancer -     PSA; Future     Return precautions given.   Risks, benefits, and alternatives of the medications and treatment plan prescribed today were discussed, and patient expressed understanding.   Education regarding symptom management and diagnosis given to patient on AVS either electronically or printed.  No follow-ups on file.  [name], FNP  Subjective:    Patient ID: Jim [name], male    DOB: July 16, 1966, 57 y.o.   MRN: [id number]  CC: Jim [name] is a 57 y.o. male who presents today for follow up.   HPI: He feels well today  States 'maybe 1-2 ' he had a low glycemic episode on current regimen  around 2am.   Tolerating medication.  Appetite is at baseline.  No weight loss.     Consulted with pharmacy.  Recommended suspension of insulin and started DPP4i 04/2023  Allergies: Jardiance [empagliflozin] and Ciprofloxacin Current Outpatient Medications on File Prior to Visit  Medication Sig Dispense Refill   acetaminophen (TYLENOL) 500 MG tablet Take 500 mg by mouth every 6 (six) hours as needed.     Continuous Glucose Sensor (FREESTYLE LIBRE 3 SENSOR) MISC Use as directed and change every 14 days 2 each 3   Glucose Blood (BLOOD GLUCOSE TEST STRIPS) STRP True Result test strips, to test blood glucose 3 times daily, use as directed 100 each 5   TRUEPLUS LANCETS 30G MISC USE AS DIRECTED 100 each 11   glipiZIDE (GLUCOTROL) 5 MG tablet Take 1 tablet (5 mg total) by mouth 2 (two) times daily before a meal. 180 tablet 3   insulin glargine (LANTUS SOLOSTAR) 100 UNIT/ML Solostar Pen Inject 3 Units into the skin daily. (Discard after 28 days of opening) (Patient not taking: Reported on 06/22/2023) 15 mL 2   No current facility-administered medications on file prior to visit.    Review of Systems  Constitutional:  Negative for chills and fever.  Respiratory:  Negative for cough.   Cardiovascular:  Negative for chest pain and  palpitations.  Gastrointestinal:  Negative for nausea and vomiting.      Objective:    BP 118/68   Pulse 98   Temp 98.4 F (36.9 C) (Oral)   Ht 5\' 9"  (1.753 m)   Wt 142 lb 12.8 oz (64.8 kg)   SpO2 97%   BMI 21.09 kg/m  BP Readings from Last 3 Encounters:  06/22/23 118/68  03/23/23 110/72  02/23/23 118/70   Wt Readings from Last 3 Encounters:  06/22/23 142 lb 12.8 oz (64.8 kg)  03/23/23 142 lb (64.4 kg)  02/23/23 144 lb 9.6 oz (65.6 kg)    Physical Exam Vitals reviewed.  Constitutional:      Appearance: He is well-developed.  Cardiovascular:     Rate and Rhythm: Regular rhythm.     Heart sounds: Normal heart sounds.  Pulmonary:     Effort:  Pulmonary effort is normal. No respiratory distress.     Breath sounds: Normal breath sounds. No wheezing, rhonchi or rales.  Skin:    General: Skin is warm and dry.  Neurological:     Mental Status: He is alert.  Psychiatric:        Speech: Speech normal.        Behavior: Behavior normal.

## 2023-06-27 NOTE — Progress Notes (Signed)
  [name] [address] [place] [address] Phone: [phone] Subjective:   Bruce Donath, am serving as a scribe for Dr. Antoine Primas.  I'm seeing this patient by the request  of:  [name], FNP  CC: Back and neck pain follow-up  [id number]  Jim [name] is a 57 y.o. male coming in with complaint of back and neck pain. OMT on 03/23/2023. Patient states that he is doing well. Lifted some boxes recently and is feeling some soreness today.   Medications patient has been prescribed:   Taking:         Reviewed prior external information including notes and imaging from previsou exam, outside providers and external EMR if available.   As well as notes that were available from care everywhere and other healthcare systems.  Past medical history, social, surgical and family history all reviewed in electronic medical record.  No pertanent information unless stated regarding to the chief complaint.   Past Medical History:  Diagnosis Date   Diabetes mellitus 2012   GERD (gastroesophageal reflux disease)     Allergies  Allergen Reactions   Jardiance [Empagliflozin]     Fatigue, weight loss   Ciprofloxacin Rash     Review of Systems:  No headache, visual changes, nausea, vomiting, diarrhea, constipation, dizziness, abdominal pain, skin rash, fevers, chills, night sweats, weight loss, swollen lymph nodes, body aches, joint swelling, chest pain, shortness of breath, mood changes. POSITIVE muscle aches  Objective  Blood pressure 108/76, pulse 100, height 5\' 9"  (1.753 m), weight 142 lb (64.4 kg), SpO2 99%.   General: No apparent distress alert and oriented x3 mood and affect normal, dressed appropriately.  HEENT: Pupils equal, extraocular movements intact  Respiratory: Patient's speak in full sentences and does not appear short of breath  Cardiovascular: No lower extremity edema, non tender, no erythema  MSK:  Back mild  loss of lordosis noted.  Mild tightness with Pearlean Brownie right greater than left.  Negative straight leg test noted today.  Osteopathic findings  C2 flexed rotated and side bent right T3 extended rotated and side bent right inhaled rib L2 flexed rotated and side bent right L3 flexed rotated and side bent left Sacrum right on right       Assessment and Plan:  Lumbar radiculopathy Likely no lumbar radiculopathy noted today.  Discussed icing regimen and home exercises, discussed which activities to do and which ones to avoid.  Increase activity slowly.  Discussed avoiding certain activities.  Patient is doing relatively well overall.  Follow-up again in 6 to 8 weeks otherwise.    Nonallopathic problems  Decision today to treat with OMT was based on Physical Exam  After verbal consent patient was treated with HVLA, ME, FPR techniques in cervical, rib, thoracic, lumbar, and sacral  areas  Patient tolerated the procedure well with improvement in symptoms  Patient given exercises, stretches and lifestyle modifications  See medications in patient instructions if given  Patient will follow up in 4-8 weeks     The above documentation has been reviewed and is accurate and complete [name], DO         Note: This dictation was prepared with Dragon dictation along with smaller phrase technology. Any transcriptional errors that result from this process are unintentional.

## 2023-06-28 ENCOUNTER — Telehealth: Payer: Self-pay | Admitting: Family

## 2023-06-28 ENCOUNTER — Other Ambulatory Visit: Payer: Self-pay | Admitting: Family

## 2023-06-28 DIAGNOSIS — E1165 Type 2 diabetes mellitus with hyperglycemia: Secondary | ICD-10-CM

## 2023-06-28 MED ORDER — LANTUS SOLOSTAR 100 UNIT/ML ~~LOC~~ SOPN: 6.0000 [IU] | PEN_INJECTOR | Freq: Every day | SUBCUTANEOUS | Status: DC | PRN

## 2023-06-28 NOTE — Telephone Encounter (Signed)
Left message  Sent mychart message if he returns call

## 2023-06-29 ENCOUNTER — Ambulatory Visit (INDEPENDENT_AMBULATORY_CARE_PROVIDER_SITE_OTHER): Payer: No Typology Code available for payment source | Admitting: Family Medicine

## 2023-06-29 ENCOUNTER — Other Ambulatory Visit: Payer: No Typology Code available for payment source | Admitting: Pharmacist

## 2023-06-29 ENCOUNTER — Encounter: Payer: Self-pay | Admitting: Family Medicine

## 2023-06-29 VITALS — BP 108/76 | HR 100 | Ht 69.0 in | Wt 142.0 lb

## 2023-06-29 DIAGNOSIS — M9904 Segmental and somatic dysfunction of sacral region: Secondary | ICD-10-CM

## 2023-06-29 DIAGNOSIS — M5416 Radiculopathy, lumbar region: Secondary | ICD-10-CM

## 2023-06-29 DIAGNOSIS — M9902 Segmental and somatic dysfunction of thoracic region: Secondary | ICD-10-CM

## 2023-06-29 DIAGNOSIS — M9903 Segmental and somatic dysfunction of lumbar region: Secondary | ICD-10-CM | POA: Diagnosis not present

## 2023-06-29 DIAGNOSIS — M9908 Segmental and somatic dysfunction of rib cage: Secondary | ICD-10-CM | POA: Diagnosis not present

## 2023-06-29 DIAGNOSIS — M9901 Segmental and somatic dysfunction of cervical region: Secondary | ICD-10-CM | POA: Diagnosis not present

## 2023-06-29 NOTE — Assessment & Plan Note (Signed)
Likely no lumbar radiculopathy noted today.  Discussed icing regimen and home exercises, discussed which activities to do and which ones to avoid.  Increase activity slowly.  Discussed avoiding certain activities.  Patient is doing relatively well overall.  Follow-up again in 6 to 8 weeks otherwise.

## 2023-06-29 NOTE — Patient Instructions (Signed)
Mr. Rayden Kohring [facility],   It was a pleasure to see you today! As we discussed:?   Continue your current medications  As instructed by PCP, resume Lantus.  Agreed it is okay to start a lower dose of 3 units daily. If you are not having overnight lows and your morning blood sugars remain over 130 mg/dL, please increase your Lantus dose by 1-2 units (every 2-3 days) until you are at the 6 unit dose prescribed.   Continuing to work toward high protein meals for lunch dinner (lean meat such as roast chicken/fish). If having lows overnight, consider a protein snack before bed.   Ideally, as sugars improve, we would replace medications that cause low blood sugars (Insulin and glipizide) with safer options that do not cause lows.   In the new year, we could consider a free 31-month sample of Ozempic at very low doses to see how you tolerate it. This is one of the best medications for blood sugar reduction. Patients often do well with slower titration of the medicine Marcelline Deist is another medication that has minimal side effects and would be a good option to try. This medication is related to [facility] but is not the same medication.    We previously talked about cardiovascular risk including methods to reduce your risk as much as possible. Those with diabetes are at a much higher risk for cardiovascular events and are recommended to take cholesterol medication (even at very low doses) to significantly reduce this risk.   Based on the American Heart Association:     - This individual has an estimated 10-year risk of CVD = 8.4%    - This individual has an estimated 30-year risk of CVD = 33.2%  Interpretation of Risk Estimates: Low risk (<5%) Borderline risk (5% to 7.4%) Intermediate risk (7.5% to 19.9%) High risk (>=20%)  Berenice Primas, PharmD - Clinical Pharmacist

## 2023-06-29 NOTE — Patient Instructions (Signed)
Good to see you! Glad [place] was good to you See you again in 2 months

## 2023-06-29 NOTE — Progress Notes (Signed)
06/29/2023 Name: Jim [name] MRN: [id number] DOB: 06-12-1966  Subjective  Chief Complaint  Patient presents with   Diabetes    Reason for visit: Jim [name] is a 57 y.o. year old male who presented for a telephone visit.   They were referred to the pharmacist by their PCP for assistance in managing diabetes.   Care Team: Primary Care Provider: [name], FNP  Reason for visit: ?  Jim [name] is a 57 y.o. male with a history of diabetes (type 2), who presents today for a diabetes pharmacotherapy visit.? Pertinent PMH also includes HLD.   Known DM Complications: no known complications  Date of Last Diabetes Related Visit: 06/22/23 with PCP ; 04/27/23 with Pharmacist [facility] At Last Diabetes Related Visit: ?  12/6 PCP: Glycemic control slightly worsened. Continued occasional lows ~2am.  Resume Lantus 6 units daily. 10/11: Start Januvia (DPP4i) 100 mg and Stop Lantus 3 units (regularly approaching hypoglycemia) 04/13/23: Continues to approach hypoglycemia regularly overnight Discussed that a trial of several days with no insulin would be beneficial in seeing how sugars are affected given only taking 3 units. Patient is open to trialing DPP4 to help with PP hyperglycemia. will consider after ~1 week with trial off of insulin. 02/23/23: Decrease glipizide to 5 mg with breakfast and with supper. Hold if low carb meal/skipped meal.   Prescription drug coverage: Payor: [employer] / Plan: [place] PREFERRED / Product Type: *No Product type* / . Reports that all medications are affordable. Received a notice from the insurance plan that they will no longer cover Januvia in the new year.  Current Patient Assistance: None   Since Last visit / History of Present Illness: ?  Patient reports partially implementing plan from last visit. Has continued all medications without changes. Has not trialed off of Lantus 3 units because he was traveling for work this past week and noted higher  sugars.   Reported DM Regimen: ?  Metformin XR 2000 mg daily Glipidize 5 mg BID (hold if low carb or skipped meal) Januvia 100 mg daily Lantus 6 units daily (restarted by PCP 06/28/23 - patient plans to resume today)   DM medications tried in the past:?  Jardiance (Fatigue) Mounjaro (Side effects; weight loss/stomach upset) Trulicity (Taking for a couple of years, dc due to increased side effects/weight loss) Lantus: Hypoglycemia on 3 units daily  Reported Diet: Patient typically eats 3 meals per day.  Breakfast (6 am): [name] croissant  Snack (10 am work days): Coffee with flavored creamer, pack of nabs Lunch (1-2 pm): Chicken salad sandwich, [employer] frozen meal Dinner: Chicken/fish + vegetable, occasionally rice; Out to eat ~2-3 times per week (splits meal with wife, aims for higher protein, lower carb options) Beverages: Water with dinner; Mid-day drink mix ([employer] - caffeine/sugar free, uses only part of the mix); Coffee; Diet soda  Nighttime snack: Almonds, dark chocolate almonds, [employer] fudge bars.    SMBG -  CGM : ? Libre 3 (Connected to [employer])  CGM Report (Date Range 11/30 - 12/13) ABG 215 mg/dL; GMI 5.6%; Variability 25.8%; TIR 31%, high 42%, very high 27%, low 0%, very low 0%  Hypo/Hyperglycemia: ?  Symptoms of hypoglycemia since last visit:? no Per PCP visit, occasional overnight lows. Patient reports less overnight dips since starting Januvia.  If yes, it was treated by: n/a  Symptoms of hyperglycemia since last visit:? no   DM Prevention:  Statin: Not taking;  Has declined previously .?  History  of chronic kidney disease? no History of albuminuria? no, last UACR on 09/06/22 = 0.8 mg/g ACE/ARB - Not taking; Urine MA/CR Ratio - normal.  Last eye exam: Last was a few months ago 2024 Last foot exam: 08/18/2022 Tobacco Use: Never smoker Immunizations:? Flu: Due, Pneumococcal: Up to Date:(PCV20 01/2022; PPSV23 02/2013); Shingrix: Due, Covid (due,  last 2021)  Cardiovascular Risk Reduction History of clinical ASCVD? no The 10-year ASCVD risk score (Arnett DK, et al., 2019) is: 9.4% AHA PREVENT Calculator: 10-year risk of CVD = 8.4%, 30-year risk of CVD = 33.2% History of heart failure? no None History of hyperlipidemia? yes Current BMI: 20.97 kg/m2 (Ht 5'9" in, Wt 64.4 kg) Taking statin? declines; n/a (read about side effects) Taking aspirin? not indicated; Not taking   Taking SGLT-2i? no Taking GLP- 1 RA? no      _______________________________________________  Objective    Review of Systems:?  Constitutional:? No fever, chills or unintentional weight loss  Cardiovascular:? No chest pain or pressure, shortness of breath, dyspnea on exertion, orthopnea or LE edema  Pulmonary:? No cough or shortness of breath  GI:? No nausea, vomiting, constipation, diarrhea, abdominal pain, dyspepsia, change in bowel habits  Endocrine:? No polyuria, polyphagia or blurred vision  Psych:? No depression, anxiety, insomnia    Physical Examination:  Vitals:  Wt Readings from Last 3 Encounters:  06/22/23 142 lb 12.8 oz (64.8 kg)  03/23/23 142 lb (64.4 kg)  02/23/23 144 lb 9.6 oz (65.6 kg)   BP Readings from Last 3 Encounters:  06/22/23 118/68  03/23/23 110/72  02/23/23 118/70   Pulse Readings from Last 3 Encounters:  06/22/23 98  03/23/23 97  02/23/23 87   The 10-year ASCVD risk score (Arnett DK, et al., 2019) is: 9.4%   Values used to calculate the score:     Age: 19 years     Sex: Male     Is Non-Hispanic African American: No     Diabetic: Yes     Tobacco smoker: No     Systolic Blood Pressure: 118 mmHg     Is BP treated: No     HDL Cholesterol: 39.5 mg/dL     Total Cholesterol: 170 mg/dL   Labs:?  Lab Results  Component Value Date   HGBA1C 8.2 (A) 06/22/2023   HGBA1C 7.3 (A) 02/23/2023   HGBA1C 8.1 (A) 11/17/2022   GLUCOSE 234 (H) 09/06/2022   MICRALBCREAT 0.8 09/06/2022   MICRALBCREAT 1.0 07/19/2021   MICRALBCREAT  0.8 09/03/2020   CREATININE 0.96 09/06/2022   CREATININE 0.87 06/27/2021   CREATININE 0.86 10/20/2020   GFRNONAA >60 10/20/2020   GFRNONAA >60 03/06/2011   GFRNONAA >60 03/05/2011    Lab Results  Component Value Date   CHOL 170 09/06/2022   LDLDIRECT 91.0 09/06/2022   HDL 39.50 09/06/2022   HDL 41.70 10/28/2021   HDL 37.50 (L) 09/03/2020   AST 17 09/06/2022   AST 15 06/27/2021   ALT 29 09/06/2022   ALT 19 06/27/2021      Chemistry      Component Value Date/Time   NA 142 09/06/2022 0733   K 4.7 09/06/2022 0733   CL 103 09/06/2022 0733   CO2 22 09/06/2022 0733   BUN 18 09/06/2022 0733   CREATININE 0.96 09/06/2022 0733      Component Value Date/Time   CALCIUM 9.9 09/06/2022 0733   ALKPHOS 67 09/06/2022 0733   AST 17 09/06/2022 0733   ALT 29 09/06/2022 0733   BILITOT 0.3 09/06/2022 5366  The 10-year ASCVD risk score (Arnett DK, et al., 2019) is: 9.4%  Assessment and Plan:   1. Diabetes, type 2 (since 2012): Uncontrolled per last A1c of 8.2% (06/27/23) increased from 7.3% (02/23/23) with ongoing intermittent hypoglycemia despite stopping insulin. Goal <7% without hypoglycemia. CGM data shows inc sugars since starting Januvia. Denies dietary changes. Glycemic control has been complicated by PP hyperglycemia 2/2 traveling for work/eating on the road with overnight lows and great glycemic control on days not traveling. Would benefit most from PPBG reduction. Advised against glipizide titration d/t hypo.  Current Regimen: Metformin XR 2000 mg daily, Glipizide 5 mg BID, Januvia 100 mg daily, Lantus 6 units (restarting 12/13). Patient reports restarting Lantus today at 3 units per comfort. Agreed this is reasonable given previous hypo on 3 units.  Advised if no overnight hypoglycemia and FBG consisently >130 m/dL increase by 1 unit every ~2-3 days until at prescribed 6 unit dose.  Diet: Discussed setting goal of increasing protein with lunch/breakfast and reducing carbs with  breakfast. Clear reduction in BG after dinner with glipizide dose, though no change in BG at breakfast/lunch despite glipizide likely due to carb content of meals. Dietary indiscretion with travel is a large barrier to glycemic control at this time. Focus on healthier choices with travel.  Exercise: N/A; Discuss in more detail at next visit  Will review LibreView data in ~1-2 weeks Reviewed signs/symptoms/treatment of hypoglycemia Future Consideration: SU: If continued overnight lows, consider stopping glipizide with dinner meal (eg glipizide 5-10 mg breakfast only), or transition to ER glipizide qAM (assess coverage in new year) GLP1-RA: Previous stomach upset on Trulicity and Mounjaro. Also prefers to avoid weight loss. Reasonable to consider Ozempic sample in the new year if amenable (microdosing)? SGLT2i: Fatigue on Jardiance. Marcelline Deist reasonable to consider if patient amenable (assess coverage in new year).  TZD: Reasonable option though long-standing diabetes since 2012, may not have profound effect.   2. ASCVD Risk Reduction (primary prevention): The 10-year ASCVD risk score (Arnett DK, et al., 2019) is: 9.4% and lipid panel on 09/06/22 with LDL of 91 mg/dL indicates patient is at moderate risk of ASCVD.  Family History CVD: Oldest brother: Multiple heart attacks, CABG; Middle brother: MI w stent  Key risk factors include: Diabetes, Hyperlipidemia Current Regimen: None. Continue medications today without changes.  Future Consideration: Reviewed ASCVD risk score with patient, and discussed general risk in persons with diabetes compared to those without. We did discuss statin therapy from a perspective of risk-reduction and incidence of true myopathy. We has a long discussion regarding risk/benefit (I.e. 8% chance of MI/stroke in the next 34yr vs <1% change of having muscle-related adverse effect on a statin). Reviewed hydrophylic statins such as rosuvastatin/Crestor having very low change of  muscle symptoms. Patient is open to considering cholesterol medication and will do some more research on his end.  Consider Rosuvastatin 10 mg daily  4. Healthcare Maintenance:  Pneumococcal - Current status: Up to date (PCV20 01/2022; PPSV23 02/2013)  Shingles - Current status: Due (no record) Influenza - Current status: Due (no record)  Due to receive the following vaccines: Tdap, Influenza, Shingrix, and Covid Booster   Follow Up Clinical pharmacist to review LibreView data 1-2 weeks.?  Future Appointments  Date Time Provider Department Center  06/29/2023  2:30 PM [name], DO [facility] None  09/18/2023  8:00 AM [facility]  09/21/2023  9:00 AM Arnett, Lyn Records, FNP [facility]   Loree Fee, PharmD Clinical Pharmacist  [facility] [phone]

## 2023-07-06 ENCOUNTER — Other Ambulatory Visit (HOSPITAL_COMMUNITY): Payer: Self-pay

## 2023-07-20 ENCOUNTER — Other Ambulatory Visit (HOSPITAL_COMMUNITY): Payer: Self-pay

## 2023-08-06 ENCOUNTER — Other Ambulatory Visit (HOSPITAL_COMMUNITY): Payer: Self-pay

## 2023-08-09 ENCOUNTER — Other Ambulatory Visit: Payer: Self-pay

## 2023-08-22 ENCOUNTER — Other Ambulatory Visit (HOSPITAL_COMMUNITY): Payer: Self-pay

## 2023-08-22 ENCOUNTER — Other Ambulatory Visit: Payer: Self-pay | Admitting: Family

## 2023-08-22 MED ORDER — FREESTYLE LIBRE 3 SENSOR MISC
1.0000 | 3 refills | Status: DC
  Filled 2023-08-22: qty 2, 28d supply, fill #0
  Filled 2023-09-24: qty 2, 28d supply, fill #1
  Filled 2023-10-19: qty 2, 28d supply, fill #2
  Filled 2023-11-09: qty 2, 28d supply, fill #3

## 2023-08-22 NOTE — Telephone Encounter (Signed)
 Copied from CRM [id number]. Topic: Clinical - Medication Refill >> Aug 22, 2023 10:29 AM Richerd A wrote: Most Recent Primary Care Visit:  Provider: GERONIMO SHAVER R  Department: [facility]  Visit Type: PATIENT OUTREACH 30  Date: 06/29/2023  Medication: Continuous Glucose Sensor (FREESTYLE LIBRE 3 SENSOR) MISC  Has the patient contacted their pharmacy? Yes (Agent: If no, request that the patient contact the pharmacy for the refill. If patient does not wish to contact the pharmacy document the reason why and proceed with request.) (Agent: If yes, when and what did the pharmacy advise?)  Is this the correct pharmacy for this prescription? Yes If no, delete pharmacy and type the correct one.  This is the patient's preferred pharmacy:  [facility] [address] [place] [address] Phone: [phone] Fax: [phone]   Has the prescription been filled recently? No  Is the patient out of the medication? Yes  Has the patient been seen for an appointment in the last year OR does the patient have an upcoming appointment? Yes  Can we respond through MyChart? Yes  Agent: Please be advised that Rx refills may take up to 3 business days. We ask that you follow-up with your pharmacy.

## 2023-08-23 ENCOUNTER — Other Ambulatory Visit (HOSPITAL_COMMUNITY): Payer: Self-pay

## 2023-09-03 ENCOUNTER — Other Ambulatory Visit (HOSPITAL_COMMUNITY): Payer: Self-pay

## 2023-09-04 NOTE — Progress Notes (Unsigned)
  [name] [address] [place] [address] Phone: [phone] Subjective:   Jim [name], am serving as a scribe for Dr. Antoine Primas.  I'm seeing this patient by the request  of:  [name], FNP  CC: Low back pain follow-up  [id number]  Jim [name] is a 58 y.o. male coming in with complaint of back and neck pain. OMT 06/29/2023. Patient states that he is the same as last visit.  Has been doing relatively well but some mild tightness.  Is going traveling fairly significantly over the next 2 weeks.  Medications patient has been prescribed: None  Taking:         Reviewed prior external information including notes and imaging from previsou exam, outside providers and external EMR if available.   As well as notes that were available from care everywhere and other healthcare systems.  Past medical history, social, surgical and family history all reviewed in electronic medical record.  No pertanent information unless stated regarding to the chief complaint.   Past Medical History:  Diagnosis Date   Diabetes mellitus 2012   GERD (gastroesophageal reflux disease)     Allergies  Allergen Reactions   Jardiance [Empagliflozin]     Fatigue, weight loss   Ciprofloxacin Rash     Review of Systems:  No headache, visual changes, nausea, vomiting, diarrhea, constipation, dizziness, abdominal pain, skin rash, fevers, chills, night sweats, weight loss, swollen lymph nodes, body aches, joint swelling, chest pain, shortness of breath, mood changes. POSITIVE muscle aches  Objective  Blood pressure 120/82, pulse 97, height 5\' 9"  (1.753 m), weight 145 lb (65.8 kg), SpO2 97%.   General: No apparent distress alert and oriented x3 mood and affect normal, dressed appropriately.  HEENT: Pupils equal, extraocular movements intact  Respiratory: Patient's speak in full sentences and does not appear short of breath  Cardiovascular: No  lower extremity edema, non tender, no erythema  Gait MSK:  Back does have some loss lordosis noted.  Some tenderness to palpation in the paraspinal musculature   Osteopathic findings  C2 flexed rotated and side bent right C5 flexed rotated and side bent left T5 extended rotated and side bent right inhaled rib T6 extended rotated and side bent left L2 flexed rotated and side bent right L5 flexed rotated and side bent left Sacrum right on right       Assessment and Plan:  Lumbar radiculopathy Patient does have tightness still noted.  Does respond well to Tylenol regimen at home.  Elevate.  Patient will continue to work on some strengthening.  Follow-up again in 6 to 8 weeks otherwise.    Nonallopathic problems  Decision today to treat with OMT was based on Physical Exam  After verbal consent patient was treated with HVLA, ME, FPR techniques in cervical, rib, thoracic, lumbar, and sacral  areas  Patient tolerated the procedure well with improvement in symptoms  Patient given exercises, stretches and lifestyle modifications  See medications in patient instructions if given  Patient will follow up in 4-8 weeks    The above documentation has been reviewed and is accurate and complete [name], DO          Note: This dictation was prepared with Dragon dictation along with smaller phrase technology. Any transcriptional errors that result from this process are unintentional.

## 2023-09-07 ENCOUNTER — Ambulatory Visit: Payer: No Typology Code available for payment source | Admitting: Family Medicine

## 2023-09-07 ENCOUNTER — Encounter: Payer: Self-pay | Admitting: Family Medicine

## 2023-09-07 VITALS — BP 120/82 | HR 97 | Ht 69.0 in | Wt 145.0 lb

## 2023-09-07 DIAGNOSIS — M5416 Radiculopathy, lumbar region: Secondary | ICD-10-CM

## 2023-09-07 DIAGNOSIS — M9908 Segmental and somatic dysfunction of rib cage: Secondary | ICD-10-CM | POA: Diagnosis not present

## 2023-09-07 DIAGNOSIS — M9901 Segmental and somatic dysfunction of cervical region: Secondary | ICD-10-CM | POA: Diagnosis not present

## 2023-09-07 DIAGNOSIS — M9903 Segmental and somatic dysfunction of lumbar region: Secondary | ICD-10-CM

## 2023-09-07 DIAGNOSIS — M9904 Segmental and somatic dysfunction of sacral region: Secondary | ICD-10-CM

## 2023-09-07 DIAGNOSIS — M9902 Segmental and somatic dysfunction of thoracic region: Secondary | ICD-10-CM | POA: Diagnosis not present

## 2023-09-07 NOTE — Patient Instructions (Signed)
 Good luck with all the travels See me in early April

## 2023-09-07 NOTE — Assessment & Plan Note (Signed)
 Patient does have tightness still noted.  Does respond well to Tylenol regimen at home.  Elevate.  Patient will continue to work on some strengthening.  Follow-up again in 6 to 8 weeks otherwise.

## 2023-09-18 ENCOUNTER — Other Ambulatory Visit: Payer: No Typology Code available for payment source

## 2023-09-18 DIAGNOSIS — E1165 Type 2 diabetes mellitus with hyperglycemia: Secondary | ICD-10-CM | POA: Diagnosis not present

## 2023-09-18 DIAGNOSIS — Z125 Encounter for screening for malignant neoplasm of prostate: Secondary | ICD-10-CM | POA: Diagnosis not present

## 2023-09-18 DIAGNOSIS — Z13220 Encounter for screening for lipoid disorders: Secondary | ICD-10-CM | POA: Diagnosis not present

## 2023-09-18 DIAGNOSIS — Z136 Encounter for screening for cardiovascular disorders: Secondary | ICD-10-CM

## 2023-09-18 LAB — COMPREHENSIVE METABOLIC PANEL
ALT: 20 U/L (ref 0–53)
AST: 16 U/L (ref 0–37)
Albumin: 4.3 g/dL (ref 3.5–5.2)
Alkaline Phosphatase: 65 U/L (ref 39–117)
BUN: 27 mg/dL — ABNORMAL HIGH (ref 6–23)
CO2: 27 meq/L (ref 19–32)
Calcium: 9.8 mg/dL (ref 8.4–10.5)
Chloride: 99 meq/L (ref 96–112)
Creatinine, Ser: 0.85 mg/dL (ref 0.40–1.50)
GFR: 97.64 mL/min (ref >60.00)
Glucose, Bld: 179 mg/dL — ABNORMAL HIGH (ref 70–99)
Potassium: 4.7 meq/L (ref 3.5–5.1)
Sodium: 135 meq/L (ref 135–145)
Total Bilirubin: 0.4 mg/dL (ref 0.2–1.2)
Total Protein: 6.6 g/dL (ref 6.0–8.3)

## 2023-09-18 LAB — CBC WITH DIFFERENTIAL/PLATELET
Basophils Absolute: 0.1 10*3/uL (ref 0.0–0.1)
Basophils Relative: 1.4 % (ref 0.0–3.0)
Eosinophils Absolute: 0.7 10*3/uL (ref 0.0–0.7)
Eosinophils Relative: 8 % — ABNORMAL HIGH (ref 0.0–5.0)
HCT: 44.3 % (ref 39.0–52.0)
Hemoglobin: 14.7 g/dL (ref 13.0–17.0)
Lymphocytes Relative: 25.2 % (ref 12.0–46.0)
Lymphs Abs: 2.2 10*3/uL (ref 0.7–4.0)
MCHC: 33.2 g/dL (ref 30.0–36.0)
MCV: 86.3 fl (ref 78.0–100.0)
Monocytes Absolute: 0.9 10*3/uL (ref 0.1–1.0)
Monocytes Relative: 10.7 % (ref 3.0–12.0)
Neutro Abs: 4.7 10*3/uL (ref 1.4–7.7)
Neutrophils Relative %: 54.7 % (ref 43.0–77.0)
Platelets: 369 10*3/uL (ref 150.0–400.0)
RBC: 5.13 Mil/uL (ref 4.22–5.81)
RDW: 14.3 % (ref 11.5–15.5)
WBC: 8.5 10*3/uL (ref 4.0–10.5)

## 2023-09-18 LAB — LIPID PANEL
Cholesterol: 176 mg/dL (ref 0–200)
HDL: 49.1 mg/dL (ref >39.00)
LDL Cholesterol: 100 mg/dL — ABNORMAL HIGH (ref 0–99)
NonHDL: 126.55
Total CHOL/HDL Ratio: 4
Triglycerides: 132 mg/dL (ref 0.0–149.0)
VLDL: 26.4 mg/dL (ref 0.0–40.0)

## 2023-09-18 LAB — MICROALBUMIN / CREATININE URINE RATIO
Creatinine,U: 114.6 mg/dL
Microalb Creat Ratio: 10.5 mg/g (ref 0.0–30.0)
Microalb, Ur: 1.2 mg/dL (ref 0.0–1.9)

## 2023-09-18 LAB — HEMOGLOBIN A1C: Hgb A1c MFr Bld: 8.6 % — ABNORMAL HIGH (ref 4.6–6.5)

## 2023-09-18 LAB — PSA: PSA: 0.87 ng/mL (ref 0.10–4.00)

## 2023-09-18 LAB — VITAMIN D 25 HYDROXY (VIT D DEFICIENCY, FRACTURES): VITD: 32.54 ng/mL (ref 30.00–100.00)

## 2023-09-18 LAB — TSH: TSH: 3.49 u[IU]/mL (ref 0.35–5.50)

## 2023-09-19 ENCOUNTER — Encounter: Payer: Self-pay | Admitting: Family

## 2023-09-19 NOTE — Progress Notes (Signed)
 Assessment & Plan:  Type 2 diabetes mellitus with hyperglycemia, without long-term current use of insulin (HCC) Assessment & Plan: Chronic, uncontrolled .  GMI 7.8% a compared to A1c 8.6. Average glucose 188.  No hypoglycemic events over the last 90 days upon review of his libre.  Continue Metformin XR 2000 mg daily, Glipidize 5 mg BID . Increase Lantus to 6 units daily qam .  He will let me know of any hypoglycemic episodes or if blood sugars approach to 200s   Hyperlipidemia, unspecified hyperlipidemia type Assessment & Plan: Increased ASCVD risk.  Discussed goal of LDL in diabetes.  Patient politely declines statin therapy      Return precautions given.   Risks, benefits, and alternatives of the medications and treatment plan prescribed today were discussed, and patient expressed understanding.   Education regarding symptom management and diagnosis given to patient on AVS either electronically or printed.  Return in about 3 months (around 12/22/2023).  Jim Plowman, FNP  Subjective:    Patient ID: Jim [name], male    DOB: 1966/03/13, 58 y.o.   MRN: [id number]  CC: Jim [name] is a 58 y.o. male who presents today for follow up.   HPI: Feels well today No new complaints   Compliant with Metformin XR 2000 mg daily, Glipidize 5 mg BID , Lantus 4 units daily qam (restarted 06/28/23)  He stopped [place] due stomach upset and low blood sugar a couple of months ago  He does endorse dietary discretion with travel of late  FBG 110-175.   No overnight hypoglycemic episodes that he can recall or reported on [name].       Allergies: Jardiance [empagliflozin] and Ciprofloxacin Current Outpatient Medications on File Prior to Visit  Medication Sig Dispense Refill   acetaminophen (TYLENOL) 500 MG tablet Take 500 mg by mouth every 6 (six) hours as needed.     Continuous Glucose Sensor (FREESTYLE LIBRE 3 SENSOR) MISC Use as directed and change every 14 days 2 each  3   glipiZIDE (GLUCOTROL) 5 MG tablet Take 1 tablet (5 mg total) by mouth 2 (two) times daily before a meal. 180 tablet 3   Glucose Blood (BLOOD GLUCOSE TEST STRIPS) STRP True Result test strips, to test blood glucose 3 times daily, use as directed 100 each 5   insulin glargine (LANTUS SOLOSTAR) 100 UNIT/ML Solostar Pen Inject 6 Units into the skin daily as needed (fasting blood sugar > 120).     metFORMIN (GLUCOPHAGE-XR) 500 MG 24 hr tablet Take 2 tablets (1,000 mg total) by mouth in the morning and at bedtime. 360 tablet 3   TRUEPLUS LANCETS 30G MISC USE AS DIRECTED 100 each 11   No current facility-administered medications on file prior to visit.    Review of Systems  Constitutional:  Negative for chills and fever.  Respiratory:  Negative for cough.   Cardiovascular:  Negative for chest pain and palpitations.  Gastrointestinal:  Negative for nausea and vomiting.      Objective:    BP 122/82   Pulse 100   Temp 98.2 F (36.8 C) (Oral)   Ht 5\' 9"  (1.753 m)   Wt 144 lb (65.3 kg)   SpO2 97%   BMI 21.27 kg/m  BP Readings from Last 3 Encounters:  09/21/23 122/82  09/07/23 120/82  06/29/23 108/76   Wt Readings from Last 3 Encounters:  09/21/23 144 lb (65.3 kg)  09/07/23 145 lb (65.8 kg)  06/29/23 142 lb (64.4 kg)  Physical Exam Vitals reviewed.  Constitutional:      Appearance: He is well-developed.  Cardiovascular:     Rate and Rhythm: Regular rhythm.     Heart sounds: Normal heart sounds.  Pulmonary:     Effort: Pulmonary effort is normal. No respiratory distress.     Breath sounds: Normal breath sounds. No wheezing, rhonchi or rales.  Skin:    General: Skin is warm and dry.  Neurological:     Mental Status: He is alert.  Psychiatric:        Speech: Speech normal.        Behavior: Behavior normal.

## 2023-09-21 ENCOUNTER — Encounter: Payer: Self-pay | Admitting: Family

## 2023-09-21 ENCOUNTER — Ambulatory Visit: Payer: No Typology Code available for payment source | Admitting: Family

## 2023-09-21 VITALS — BP 122/82 | HR 100 | Temp 98.2°F | Ht 69.0 in | Wt 144.0 lb

## 2023-09-21 DIAGNOSIS — Z7984 Long term (current) use of oral hypoglycemic drugs: Secondary | ICD-10-CM | POA: Diagnosis not present

## 2023-09-21 DIAGNOSIS — E785 Hyperlipidemia, unspecified: Secondary | ICD-10-CM

## 2023-09-21 DIAGNOSIS — E1165 Type 2 diabetes mellitus with hyperglycemia: Secondary | ICD-10-CM

## 2023-09-21 DIAGNOSIS — Z794 Long term (current) use of insulin: Secondary | ICD-10-CM | POA: Diagnosis not present

## 2023-09-21 NOTE — Assessment & Plan Note (Addendum)
 Chronic, uncontrolled .  GMI 7.8% a compared to A1c 8.6. Average glucose 188.  No hypoglycemic events over the last 90 days upon review of his libre.  Continue Metformin XR 2000 mg daily, Glipidize 5 mg BID . Increase Lantus to 6 units daily qam .  He will let me know of any hypoglycemic episodes or if blood sugars approach to 200s

## 2023-09-21 NOTE — Assessment & Plan Note (Signed)
 Increased ASCVD risk.  Discussed goal of LDL in diabetes.  Patient politely declines statin therapy

## 2023-09-21 NOTE — Patient Instructions (Addendum)
 Increase lantus to 6 units  Call me with blood glucose < 70 or > 200.   Goal of A1c ( GMI on your Josephine Igo) is closer to 7.  [email or web address]

## 2023-09-25 ENCOUNTER — Other Ambulatory Visit (HOSPITAL_COMMUNITY): Payer: Self-pay

## 2023-10-04 ENCOUNTER — Other Ambulatory Visit (HOSPITAL_COMMUNITY): Payer: Self-pay

## 2023-10-05 ENCOUNTER — Other Ambulatory Visit (HOSPITAL_COMMUNITY): Payer: Self-pay

## 2023-10-18 NOTE — Progress Notes (Unsigned)
 Jim [name] [address] [place] [address] Phone: [phone] Subjective:   Jim [name], am serving as a scribe for Dr. Antoine Primas.  I'm seeing this patient by the request  of:  [name], FNP  CC: Back pain, right side pain  [id number]  Jim [name] is a 58 y.o. male coming in with complaint of back and neck pain. OMT 09/07/2023. Patient states that he hurt the ribs on R side 4 weeks ago. Placing a tarp over a trailer and landed on a bar on the trailer. Uncomfortable to lie down and pain worse this week as he has been more active. Does have some pain with breathing. Coughing and sneezing causes increase in pain. Leaves for [place].    Medications patient has been prescribed: None  Taking:         Reviewed prior external information including notes and imaging from previsou exam, outside providers and external EMR if available.   As well as notes that were available from care everywhere and other healthcare systems.  Past medical history, social, surgical and family history all reviewed in electronic medical record.  No pertanent information unless stated regarding to the chief complaint.   Past Medical History:  Diagnosis Date   Diabetes mellitus 2012   GERD (gastroesophageal reflux disease)     Allergies  Allergen Reactions   Jardiance [Empagliflozin]     Fatigue, weight loss   Ciprofloxacin Rash     Review of Systems:  No headache, visual changes, nausea, vomiting, diarrhea, constipation, dizziness, abdominal pain, skin rash, fevers, chills, night sweats, weight loss, swollen lymph nodes, body aches, joint swelling, chest pain, shortness of breath, mood changes. POSITIVE muscle aches  Objective  Blood pressure 108/74, pulse 80, height 5\' 9"  (1.753 m), weight 145 lb (65.8 kg), SpO2 97%.   General: No apparent distress alert and oriented x3 mood and affect normal, dressed appropriately.  HEENT: Pupils  equal, extraocular movements intact  Respiratory: Patient's speak in full sentences and does not appear short of breath  Cardiovascular: No lower extremity edema, non tender, no erythema  Gait MSK:  Back does have some loss lordosis noted.  Some tenderness to palpation in the paraspinal musculature.  Tightness with Pearlean Brownie right greater than left.  Osteopathic findings  C2 flexed rotated and side bent right C7 flexed rotated and side bent left T3 extended rotated and side bent right inhaled rib T7 extended rotated and side bent left L2 flexed rotated and side bent right Sacrum right on right     Assessment and Plan:  Right rib fracture History of rib fracture previously and does not seem to be back.  He did fall on it, probably more of a muscle contusion.  Zanaflex given with patient traveling out of the country for an extended amount of time.  Discussed icing regimen and home exercises.  Increase activity slowly.  Follow-up again in 6 to 8 weeks.    Nonallopathic problems  Decision today to treat with OMT was based on Physical Exam  After verbal consent patient was treated with HVLA, ME, FPR techniques in cervical, rib, thoracic, lumbar, and sacral  areas  Patient tolerated the procedure well with improvement in symptoms  Patient given exercises, stretches and lifestyle modifications  See medications in patient instructions if given  Patient will follow up in 4-8 weeks     The above documentation has been reviewed and is accurate and complete [name], DO  Note: This dictation was prepared with Dragon dictation along with smaller phrase technology. Any transcriptional errors that result from this process are unintentional.

## 2023-10-19 ENCOUNTER — Other Ambulatory Visit (HOSPITAL_COMMUNITY): Payer: Self-pay

## 2023-10-19 ENCOUNTER — Ambulatory Visit: Payer: No Typology Code available for payment source | Admitting: Family Medicine

## 2023-10-19 VITALS — BP 108/74 | HR 80 | Ht 69.0 in | Wt 145.0 lb

## 2023-10-19 DIAGNOSIS — S2231XA Fracture of one rib, right side, initial encounter for closed fracture: Secondary | ICD-10-CM | POA: Diagnosis not present

## 2023-10-19 DIAGNOSIS — M9904 Segmental and somatic dysfunction of sacral region: Secondary | ICD-10-CM

## 2023-10-19 DIAGNOSIS — M9902 Segmental and somatic dysfunction of thoracic region: Secondary | ICD-10-CM | POA: Diagnosis not present

## 2023-10-19 DIAGNOSIS — M5416 Radiculopathy, lumbar region: Secondary | ICD-10-CM | POA: Diagnosis not present

## 2023-10-19 DIAGNOSIS — M9901 Segmental and somatic dysfunction of cervical region: Secondary | ICD-10-CM | POA: Diagnosis not present

## 2023-10-19 DIAGNOSIS — M9903 Segmental and somatic dysfunction of lumbar region: Secondary | ICD-10-CM

## 2023-10-19 DIAGNOSIS — M9908 Segmental and somatic dysfunction of rib cage: Secondary | ICD-10-CM

## 2023-10-19 MED ORDER — TIZANIDINE HCL 2 MG PO TABS
2.0000 mg | ORAL_TABLET | Freq: Every evening | ORAL | 0 refills | Status: AC | PRN
Start: 2023-10-19 — End: ?
  Filled 2023-10-19: qty 30, 30d supply, fill #0

## 2023-10-19 NOTE — Assessment & Plan Note (Signed)
 History of rib fracture previously and does not seem to be back.  He did fall on it, probably more of a muscle contusion.  Zanaflex given with patient traveling out of the country for an extended amount of time.  Discussed icing regimen and home exercises.  Increase activity slowly.  Follow-up again in 6 to 8 weeks.

## 2023-10-19 NOTE — Patient Instructions (Addendum)
 Good to see you! Think about the soccer game Zanaflex 2mg  PRN at night See you again in 6-8 weeks

## 2023-10-19 NOTE — Assessment & Plan Note (Signed)
 Stable with no radicular symptoms.  Continues to respond well to osteopathic manipulation.  Follow-up again in 6 to 8 weeks

## 2023-11-02 ENCOUNTER — Other Ambulatory Visit: Payer: Self-pay

## 2023-11-02 ENCOUNTER — Other Ambulatory Visit (HOSPITAL_COMMUNITY): Payer: Self-pay

## 2023-11-02 ENCOUNTER — Other Ambulatory Visit: Payer: Self-pay | Admitting: Family

## 2023-11-06 ENCOUNTER — Other Ambulatory Visit (HOSPITAL_COMMUNITY): Payer: Self-pay

## 2023-11-06 ENCOUNTER — Other Ambulatory Visit: Payer: Self-pay | Admitting: Family

## 2023-11-06 ENCOUNTER — Other Ambulatory Visit: Payer: Self-pay

## 2023-11-06 DIAGNOSIS — E1165 Type 2 diabetes mellitus with hyperglycemia: Secondary | ICD-10-CM

## 2023-11-06 MED ORDER — LANTUS SOLOSTAR 100 UNIT/ML ~~LOC~~ SOPN: 6.0000 [IU] | PEN_INJECTOR | Freq: Every day | SUBCUTANEOUS | Status: DC | PRN

## 2023-11-06 MED ORDER — INSULIN PEN NEEDLE 32G X 4 MM MISC
3 refills | Status: DC
  Filled 2023-11-06: qty 100, 30d supply, fill #0
  Filled 2023-12-29: qty 100, 30d supply, fill #1

## 2023-11-06 NOTE — Telephone Encounter (Signed)
 Copied from CRM [id number]. Topic: Clinical - Medication Refill >> Nov 06, 2023 10:22 AM Winnifred Havers wrote: Most Recent Primary Care Visit:  Provider: Calista Catching  Department: [facility]  Visit Type: OFFICE VISIT  Date: 09/21/2023  Medication: insulin  glargine (LANTUS  SOLOSTAR) 100 UNIT/ML Solostar Pen  Has the patient contacted their pharmacy? Yes (Agent: If no, request that the patient contact the pharmacy for the refill. If patient does not wish to contact the pharmacy document the reason why and proceed with request.) (Agent: If yes, when and what did the pharmacy advise?)  Is this the correct pharmacy for this prescription? Yes If no, delete pharmacy and type the correct one.  This is the patient's preferred pharmacy:  [facility] [address] [place] [address] Phone: [phone] Fax: [phone]   Has the prescription been filled recently? Yes  Is the patient out of the medication? Yes  Has the patient been seen for an appointment in the last year OR does the patient have an upcoming appointment? Yes  Can we respond through MyChart? Yes  Agent: Please be advised that Rx refills may take up to 3 business days. We ask that you follow-up with your pharmacy.

## 2023-11-07 ENCOUNTER — Other Ambulatory Visit (HOSPITAL_COMMUNITY): Payer: Self-pay

## 2023-11-09 ENCOUNTER — Other Ambulatory Visit: Payer: Self-pay

## 2023-12-07 ENCOUNTER — Other Ambulatory Visit: Payer: Self-pay | Admitting: Family

## 2023-12-07 ENCOUNTER — Other Ambulatory Visit (HOSPITAL_COMMUNITY): Payer: Self-pay

## 2023-12-11 ENCOUNTER — Other Ambulatory Visit (HOSPITAL_COMMUNITY): Payer: Self-pay

## 2023-12-11 MED ORDER — FREESTYLE LIBRE 3 SENSOR MISC
1.0000 | 3 refills | Status: DC
  Filled 2023-12-11: qty 2, 28d supply, fill #0
  Filled 2024-01-07: qty 2, 28d supply, fill #1
  Filled 2024-02-06: qty 2, 28d supply, fill #2
  Filled 2024-03-18: qty 2, 28d supply, fill #3

## 2023-12-27 ENCOUNTER — Encounter: Payer: Self-pay | Admitting: Family

## 2023-12-27 ENCOUNTER — Ambulatory Visit: Admitting: Family

## 2023-12-27 ENCOUNTER — Other Ambulatory Visit (HOSPITAL_COMMUNITY): Payer: Self-pay

## 2023-12-27 VITALS — BP 110/78 | HR 77 | Temp 98.2°F | Ht 68.0 in | Wt 164.2 lb

## 2023-12-27 DIAGNOSIS — Z7984 Long term (current) use of oral hypoglycemic drugs: Secondary | ICD-10-CM

## 2023-12-27 DIAGNOSIS — E1165 Type 2 diabetes mellitus with hyperglycemia: Secondary | ICD-10-CM

## 2023-12-27 DIAGNOSIS — L237 Allergic contact dermatitis due to plants, except food: Secondary | ICD-10-CM

## 2023-12-27 DIAGNOSIS — R7309 Other abnormal glucose: Secondary | ICD-10-CM

## 2023-12-27 LAB — POCT GLYCOSYLATED HEMOGLOBIN (HGB A1C): Hemoglobin A1C: 8.7 % — AB (ref 4.0–5.6)

## 2023-12-27 MED ORDER — DESOXIMETASONE 0.05 % EX CREA
TOPICAL_CREAM | Freq: Two times a day (BID) | CUTANEOUS | 0 refills | Status: AC
  Filled 2023-12-27: qty 60, 15d supply, fill #0

## 2023-12-27 MED ORDER — TRIAMCINOLONE ACETONIDE 0.5 % EX OINT
1.0000 | TOPICAL_OINTMENT | Freq: Two times a day (BID) | CUTANEOUS | 2 refills | Status: AC
  Filled 2023-12-27: qty 15, 8d supply, fill #0

## 2023-12-27 NOTE — Progress Notes (Signed)
 Assessment & Plan:  Type 2 diabetes mellitus with hyperglycemia, without long-term current use of insulin  (HCC) Assessment & Plan: Chronic, uncontrolled .  GMI 8.8 % a compared to A1c 8.7. Average glucose 188.  No hypoglycemic events over the last 90 days upon review of his libre.  Continue Metformin  XR 2000 mg daily, Glipidize 5 mg BID . Increase Lantus  to 8 units daily qam .  He will let me know of any hypoglycemic episodes or if blood sugars approach to 200s so we can adjust in between office visits.   Of note, discussed diabetic foot exam.  Advised PAD screening with ultrasound.  He politely declines in the absence of other gross symptoms to suggest PAD.  Will continue to monitor   Elevated glucose -     POCT glycosylated hemoglobin (Hb A1C)  Poison oak dermatitis -     Desoximetasone; Apply topically 2 (two) times daily. Using sparingly less than one week. For poison oak  Dispense: 60 g; Refill: 0     Return precautions given.   Risks, benefits, and alternatives of the medications and treatment plan prescribed today were discussed, and patient expressed understanding.   Education regarding symptom management and diagnosis given to patient on AVS either electronically or printed.  Return in about 3 months (around 03/28/2024).  Bascom Bossier, FNP  Subjective:    Patient ID: Jim [name], male    DOB: 1965-10-24, 58 y.o.   MRN: [id number]  CC: Jim [name] is a 58 y.o. male who presents today for follow up.   HPI: Feels well today.  No new complaints.  He is recently joined the gym.  Denies leg swelling, intermittent claudication, leg discoloration   Increased lantus  to 6 units daily at prior visit. He takes lantus  5 to 6 units. Compliant with glipizide  and metformin   No hypoglycemic episodes.   H/o poison oak dermatitis and requests refill of topical corticosteroid for his arms.   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 Allergies: Jardiance  [empagliflozin ] and Ciprofloxacin  Current Outpatient Medications on File Prior to Visit  Medication Sig Dispense Refill   acetaminophen (TYLENOL) 500 MG tablet Take 500 mg by mouth every 6 (six) hours as needed.     Continuous Glucose Sensor (FREESTYLE LIBRE 3 SENSOR) MISC Use as directed and change every 14 days 2 each 3   glipiZIDE  (GLUCOTROL ) 5 MG tablet Take 1 tablet (5 mg total) by mouth 2 (two) times daily before a meal. 180 tablet 3   Glucose Blood (BLOOD GLUCOSE TEST STRIPS) STRP True Result test strips, to test blood glucose 3 times daily, use as directed 100 each 5   insulin  glargine (LANTUS  SOLOSTAR) 100 UNIT/ML Solostar Pen Inject 6 Units into the skin daily as needed (fasting blood sugar > 120).     Insulin  Pen Needle 32G X 4 MM MISC Use once daily as directed. 100 each 3   metFORMIN  (GLUCOPHAGE -XR) 500 MG 24 hr tablet Take 2 tablets (1,000 mg total) by mouth in the morning and at bedtime. 360 tablet 3   tiZANidine  (ZANAFLEX ) 2 MG tablet Take 1 tablet (2 mg total) by mouth at bedtime as needed for muscle spasms. 30 tablet 0   TRUEPLUS LANCETS 30G MISC USE AS DIRECTED 100 each 11   No current facility-administered medications on file prior to visit.    Review of Systems  Constitutional:  Negative for chills and fever.  Respiratory:  Negative for cough.  Cardiovascular:  Negative for chest pain and palpitations.  Gastrointestinal:  Negative for nausea and vomiting.      Objective:    BP 110/78   Pulse 77   Temp 98.2 F (36.8 C) (Oral)   Ht 5' 8 (1.727 m)   Wt 164 lb 3.2 oz (74.5 kg)   SpO2 97%   BMI 24.97 kg/m  BP Readings from Last 3 Encounters:  12/27/23 110/78  10/19/23 108/74  09/21/23 122/82   Wt Readings from Last 3 Encounters:  12/27/23 164 lb 3.2 oz (74.5 kg)  10/19/23 145 lb (65.8 kg)  09/21/23 144 lb (65.3 kg)    Physical Exam Vitals reviewed.  Constitutional:      Appearance: He is well-developed.   Cardiovascular:      Rate and Rhythm: Regular rhythm.     Heart sounds: Normal heart sounds.  Pulmonary:     Effort: Pulmonary effort is normal. No respiratory distress.     Breath sounds: Normal breath sounds. No wheezing, rhonchi or rales.   Skin:    General: Skin is warm and dry.   Neurological:     Mental Status: He is alert.   Psychiatric:        Speech: Speech normal.        Behavior: Behavior normal.

## 2023-12-27 NOTE — Progress Notes (Signed)
  [facility] [address] [place] [address] Phone: [phone] Subjective:   IBryan [name], am serving as a scribe for Dr. Ronnell Coins.  I'm seeing this patient by the request  of:  Calista Catching, FNP  CC: Low back and neck pain follow-up  [id number]  [name] is a 58 y.o. male coming in with complaint of back and neck pain. OMT 10/19/2023. Patient states doing well. Same per usual. No new symptoms.  Will be traveling to [place]  and will be doing some hiking.  Medications patient has been prescribed: None  Taking:         Reviewed prior external information including notes and imaging from previsou exam, outside providers and external EMR if available.   As well as notes that were available from care everywhere and other healthcare systems.  Past medical history, social, surgical and family history all reviewed in electronic medical record.  No pertanent information unless stated regarding to the chief complaint.   Past Medical History:  Diagnosis Date   Diabetes mellitus 2012   GERD (gastroesophageal reflux disease)     Allergies  Allergen Reactions   Jardiance  [Empagliflozin ]     Fatigue, weight loss   Ciprofloxacin  Rash     Review of Systems:  No headache, visual changes, nausea, vomiting, diarrhea, constipation, dizziness, abdominal pain, skin rash, fevers, chills, night sweats, weight loss, swollen lymph nodes, body aches, joint swelling, chest pain, shortness of breath, mood changes. POSITIVE muscle aches  Objective  Blood pressure 110/80, pulse 80, height 5' 8 (1.727 m), weight 145 lb (65.8 kg), SpO2 97%.   General: No apparent distress alert and oriented x3 mood and affect normal, dressed appropriately.  HEENT: Pupils equal, extraocular movements intact  Respiratory: Patient's speak in full sentences and does not appear short of breath  Cardiovascular: No lower extremity edema, non tender, no erythema   Gait MSK:  Back does have some loss lordosis noted.  Some tenderness to palpation in the paraspinal musculature.  Osteopathic findings  C2 flexed rotated and side bent right C6 flexed rotated and side bent left T3 extended rotated and side bent right inhaled rib T9 extended rotated and side bent left L2 flexed rotated and side bent right Sacrum right on right       Assessment and Plan:  Lumbar radiculopathy Continues to have some low back pain.  Nothing that is stopping him from activity.  Will be traveling.  Discussed different treatment options.  Elected to try conservative therapy including home exercises and icing regimen.  Discussed certain activities that will be beneficial and other ones that could maybe make it more difficult.  Follow-up again in 6 to 8 weeks otherwise.    Nonallopathic problems  Decision today to treat with OMT was based on Physical Exam  After verbal consent patient was treated with HVLA, ME, FPR techniques in cervical, rib, thoracic, lumbar, and sacral  areas.  Patient tolerated the procedure well with improvement in symptoms  Patient given exercises, stretches and lifestyle modifications  See medications in patient instructions if given  Patient will follow up in 4-8 weeks    The above documentation has been reviewed and is accurate and complete Ashling Roane M Alyse Kathan, DO          Note: This dictation was prepared with Dragon dictation along with smaller phrase technology. Any transcriptional errors that result from this process are unintentional.

## 2023-12-27 NOTE — Patient Instructions (Addendum)
 Increase lantus  to 8 units.  Continue metformin , glipizide    Please let me know if blood sugar < 70 or persistently > 200

## 2023-12-27 NOTE — Addendum Note (Signed)
 Addended by: Eulalah Rupert G on: 12/27/2023 10:14 AM   Modules accepted: Orders

## 2023-12-27 NOTE — Assessment & Plan Note (Addendum)
 Chronic, uncontrolled .  GMI 8.8 % a compared to A1c 8.7. Average glucose 188.  No hypoglycemic events over the last 90 days upon review of his libre.  Continue Metformin  XR 2000 mg daily, Glipidize 5 mg BID . Increase Lantus  to 8 units daily qam .  He will let me know of any hypoglycemic episodes or if blood sugars approach to 200s so we can adjust in between office visits.   Of note, discussed diabetic foot exam.  Advised PAD screening with ultrasound.  He politely declines in the absence of other gross symptoms to suggest PAD.  Will continue to monitor

## 2023-12-28 ENCOUNTER — Encounter: Payer: Self-pay | Admitting: Family Medicine

## 2023-12-28 ENCOUNTER — Other Ambulatory Visit (HOSPITAL_COMMUNITY): Payer: Self-pay

## 2023-12-28 ENCOUNTER — Ambulatory Visit: Admitting: Family

## 2023-12-28 ENCOUNTER — Ambulatory Visit (INDEPENDENT_AMBULATORY_CARE_PROVIDER_SITE_OTHER): Admitting: Family Medicine

## 2023-12-28 VITALS — BP 110/80 | HR 80 | Ht 68.0 in | Wt 145.0 lb

## 2023-12-28 DIAGNOSIS — M9904 Segmental and somatic dysfunction of sacral region: Secondary | ICD-10-CM | POA: Diagnosis not present

## 2023-12-28 DIAGNOSIS — M9908 Segmental and somatic dysfunction of rib cage: Secondary | ICD-10-CM

## 2023-12-28 DIAGNOSIS — M9902 Segmental and somatic dysfunction of thoracic region: Secondary | ICD-10-CM | POA: Diagnosis not present

## 2023-12-28 DIAGNOSIS — M9901 Segmental and somatic dysfunction of cervical region: Secondary | ICD-10-CM

## 2023-12-28 DIAGNOSIS — M5416 Radiculopathy, lumbar region: Secondary | ICD-10-CM | POA: Diagnosis not present

## 2023-12-28 DIAGNOSIS — M9903 Segmental and somatic dysfunction of lumbar region: Secondary | ICD-10-CM | POA: Diagnosis not present

## 2023-12-28 NOTE — Assessment & Plan Note (Signed)
 Continues to have some low back pain.  Nothing that is stopping him from activity.  Will be traveling.  Discussed different treatment options.  Elected to try conservative therapy including home exercises and icing regimen.  Discussed certain activities that will be beneficial and other ones that could maybe make it more difficult.  Follow-up again in 6 to 8 weeks otherwise.

## 2023-12-28 NOTE — Patient Instructions (Addendum)
 Good to see you Recommend seeing [place] Look up undercanvas See me again in 2 months

## 2023-12-29 ENCOUNTER — Other Ambulatory Visit (HOSPITAL_COMMUNITY): Payer: Self-pay

## 2024-01-07 ENCOUNTER — Other Ambulatory Visit (HOSPITAL_COMMUNITY): Payer: Self-pay

## 2024-02-06 ENCOUNTER — Other Ambulatory Visit (HOSPITAL_COMMUNITY): Payer: Self-pay

## 2024-02-21 NOTE — Progress Notes (Deleted)
  [facility] [address] [place] [address] Phone: [phone] Subjective:    I'm seeing this patient by the request  of:  Dineen Rollene MATSU, FNP  CC:   YEP:Dlagzrupcz  Jim [name] is a 58 y.o. male coming in with complaint of back and neck pain. OMT 12/28/2023. Patient states   Medications patient has been prescribed: Zanaflex  Taking:         Reviewed prior external information including notes and imaging from previsou exam, outside providers and external EMR if available.   As well as notes that were available from care everywhere and other healthcare systems.  Past medical history, social, surgical and family history all reviewed in electronic medical record.  No pertanent information unless stated regarding to the chief complaint.   Past Medical History:  Diagnosis Date   Diabetes mellitus 2012   GERD (gastroesophageal reflux disease)     Allergies  Allergen Reactions   Jardiance  [Empagliflozin ]     Fatigue, weight loss   Ciprofloxacin  Rash     Review of Systems:  No headache, visual changes, nausea, vomiting, diarrhea, constipation, dizziness, abdominal pain, skin rash, fevers, chills, night sweats, weight loss, swollen lymph nodes, body aches, joint swelling, chest pain, shortness of breath, mood changes. POSITIVE muscle aches  Objective  There were no vitals taken for this visit.   General: No apparent distress alert and oriented x3 mood and affect normal, dressed appropriately.  HEENT: Pupils equal, extraocular movements intact  Respiratory: Patient's speak in full sentences and does not appear short of breath  Cardiovascular: No lower extremity edema, non tender, no erythema  Gait MSK:  Back   Osteopathic findings  C2 flexed rotated and side bent right C6 flexed rotated and side bent left T3 extended rotated and side bent right inhaled rib T9 extended rotated and side bent left L2 flexed rotated and side  bent right Sacrum right on right       Assessment and Plan:  No problem-specific Assessment & Plan notes found for this encounter.    Nonallopathic problems  Decision today to treat with OMT was based on Physical Exam  After verbal consent patient was treated with HVLA, ME, FPR techniques in cervical, rib, thoracic, lumbar, and sacral  areas  Patient tolerated the procedure well with improvement in symptoms  Patient given exercises, stretches and lifestyle modifications  See medications in patient instructions if given  Patient will follow up in 4-8 weeks             Note: This dictation was prepared with Dragon dictation along with smaller phrase technology. Any transcriptional errors that result from this process are unintentional.

## 2024-02-22 ENCOUNTER — Ambulatory Visit: Admitting: Family Medicine

## 2024-03-08 ENCOUNTER — Other Ambulatory Visit: Payer: Self-pay | Admitting: Family

## 2024-03-08 DIAGNOSIS — E1165 Type 2 diabetes mellitus with hyperglycemia: Secondary | ICD-10-CM

## 2024-03-10 ENCOUNTER — Other Ambulatory Visit (HOSPITAL_COMMUNITY): Payer: Self-pay

## 2024-03-10 ENCOUNTER — Other Ambulatory Visit: Payer: Self-pay

## 2024-03-10 MED ORDER — GLIPIZIDE 5 MG PO TABS
5.0000 mg | ORAL_TABLET | Freq: Two times a day (BID) | ORAL | 3 refills | Status: AC
  Filled 2024-03-10: qty 180, 90d supply, fill #0

## 2024-03-18 ENCOUNTER — Other Ambulatory Visit (HOSPITAL_COMMUNITY): Payer: Self-pay

## 2024-04-02 ENCOUNTER — Other Ambulatory Visit (HOSPITAL_COMMUNITY): Payer: Self-pay

## 2024-04-11 ENCOUNTER — Other Ambulatory Visit (HOSPITAL_COMMUNITY): Payer: Self-pay

## 2024-04-11 ENCOUNTER — Encounter: Payer: Self-pay | Admitting: Family

## 2024-04-11 ENCOUNTER — Ambulatory Visit: Admitting: Family

## 2024-04-11 VITALS — BP 116/78 | HR 84 | Temp 98.2°F | Ht 69.0 in | Wt 145.2 lb

## 2024-04-11 DIAGNOSIS — Z7984 Long term (current) use of oral hypoglycemic drugs: Secondary | ICD-10-CM

## 2024-04-11 DIAGNOSIS — E1165 Type 2 diabetes mellitus with hyperglycemia: Secondary | ICD-10-CM | POA: Diagnosis not present

## 2024-04-11 DIAGNOSIS — R7309 Other abnormal glucose: Secondary | ICD-10-CM

## 2024-04-11 LAB — POCT GLYCOSYLATED HEMOGLOBIN (HGB A1C): Hemoglobin A1C: 7.7 % — AB (ref 4.0–5.6)

## 2024-04-11 MED ORDER — FREESTYLE LIBRE 3 PLUS SENSOR MISC
6 refills | Status: DC
  Filled 2024-04-11: qty 2, 30d supply, fill #0
  Filled 2024-05-20: qty 2, 30d supply, fill #1

## 2024-04-11 NOTE — Progress Notes (Signed)
 Assessment & Plan:  Type 2 diabetes mellitus with hyperglycemia, without long-term current use of insulin  (HCC) Assessment & Plan: CGM reviewed. GMI 7.9%, average 190 Trial glipizide  5mg  breakfast only and discontinue 5mg  at dinner time.  Discussed risk of hypoglycemia with glipizide  and ultimately would like to get patient off of glipizide  completely. Increase lantus  to 11 units.  Continue metformin  2000mg  every day.  Orders: -     FreeStyle Libre 3 Plus Sensor; Change sensor every 15 days.  Dispense: 2 each; Refill: 6  Elevated glucose -     POCT glycosylated hemoglobin (Hb A1C)     Return precautions given.   Risks, benefits, and alternatives of the medications and treatment plan prescribed today were discussed, and patient expressed understanding.   Education regarding symptom management and diagnosis given to patient on AVS either electronically or printed.  Return in about 3 months (around 07/11/2024).  [name], FNP  Subjective:    Patient ID: Jim [name], male    DOB: March 20, 1966, 58 y.o.   MRN: [id number]  CC: Jim [name] is a 58 y.o. male who presents today for follow up.   HPI:  Feels well today No new complaints.   He is compliant with Metformin  XR 2000 mg daily, Glipizide  5 mg BID breakfast and dinner, lantus  to 8 units daily qam.  He is wearing CGM.   He has had infrequent low blood sugars , typically early morning.     Allergies: Jardiance  [empagliflozin ] and Ciprofloxacin  Current Outpatient Medications on File Prior to Visit  Medication Sig Dispense Refill   acetaminophen (TYLENOL) 500 MG tablet Take 500 mg by mouth every 6 (six) hours as needed.     desoximetasone  (TOPICORT ) 0.05 % cream Apply topically 2 (two) times daily. Using sparingly less than one week. For poison oak 60 g 0   glipiZIDE  (GLUCOTROL ) 5 MG tablet Take 1 tablet (5 mg total) by mouth 2 (two) times daily before a meal. 180 tablet 3   Glucose Blood (BLOOD GLUCOSE TEST  STRIPS) STRP True Result test strips, to test blood glucose 3 times daily, use as directed 100 each 5   insulin  glargine (LANTUS  SOLOSTAR) 100 UNIT/ML Solostar Pen Inject 6 Units into the skin daily as needed (fasting blood sugar > 120).     Insulin  Pen Needle 32G X 4 MM MISC Use once daily as directed. 100 each 3   metFORMIN  (GLUCOPHAGE -XR) 500 MG 24 hr tablet Take 2 tablets (1,000 mg total) by mouth in the morning and at bedtime. 360 tablet 3   tiZANidine  (ZANAFLEX ) 2 MG tablet Take 1 tablet (2 mg total) by mouth at bedtime as needed for muscle spasms. 30 tablet 0   triamcinolone  ointment (KENALOG ) 0.5 % Apply topically 2 (two) times daily. Use sparingly for less than 1 week 15 g 2   TRUEPLUS LANCETS 30G MISC USE AS DIRECTED 100 each 11   No current facility-administered medications on file prior to visit.    Review of Systems  Constitutional:  Negative for chills and fever.  Respiratory:  Negative for cough.   Cardiovascular:  Negative for chest pain and palpitations.  Gastrointestinal:  Negative for nausea and vomiting.      Objective:    BP 116/78   Pulse 84   Temp 98.2 F (36.8 C) (Oral)   Ht 5' 9 (1.753 m)   Wt 145 lb 3.2 oz (65.9 kg)   SpO2 99%   BMI 21.44 kg/m  BP Readings from Last  3 Encounters:  04/11/24 116/78  12/28/23 110/80  12/27/23 110/78   Wt Readings from Last 3 Encounters:  04/11/24 145 lb 3.2 oz (65.9 kg)  12/28/23 145 lb (65.8 kg)  12/27/23 164 lb 3.2 oz (74.5 kg)    Physical Exam Vitals reviewed.  Constitutional:      Appearance: He is well-developed.  Cardiovascular:     Rate and Rhythm: Regular rhythm.     Heart sounds: Normal heart sounds.  Pulmonary:     Effort: Pulmonary effort is normal. No respiratory distress.     Breath sounds: Normal breath sounds. No wheezing, rhonchi or rales.  Skin:    General: Skin is warm and dry.  Neurological:     Mental Status: He is alert.  Psychiatric:        Speech: Speech normal.        Behavior:  Behavior normal.

## 2024-04-11 NOTE — Patient Instructions (Addendum)
 Trial glipizide  5mg  breakfast only and discontinue 5mg  at dinner time Increase lantus  to 11 units.  Continue metformin  2000mg  every day.  Let me know how blood sugars are and certainly blood sugars < 70 or > 250.

## 2024-04-11 NOTE — Assessment & Plan Note (Addendum)
 CGM reviewed. GMI 7.9%, average 190 Trial glipizide  5mg  breakfast only and discontinue 5mg  at dinner time.  Discussed risk of hypoglycemia with glipizide  and ultimately would like to get patient off of glipizide  completely. Increase lantus  to 11 units.  Continue metformin  2000mg  every day.

## 2024-04-19 ENCOUNTER — Other Ambulatory Visit (HOSPITAL_COMMUNITY): Payer: Self-pay

## 2024-05-13 ENCOUNTER — Other Ambulatory Visit (HOSPITAL_COMMUNITY): Payer: Self-pay

## 2024-05-20 ENCOUNTER — Other Ambulatory Visit (HOSPITAL_COMMUNITY): Payer: Self-pay

## 2024-05-21 ENCOUNTER — Other Ambulatory Visit: Payer: Self-pay

## 2024-05-27 ENCOUNTER — Other Ambulatory Visit (HOSPITAL_COMMUNITY): Payer: Self-pay

## 2024-05-29 ENCOUNTER — Other Ambulatory Visit: Payer: Self-pay | Admitting: Family

## 2024-05-29 DIAGNOSIS — E1165 Type 2 diabetes mellitus with hyperglycemia: Secondary | ICD-10-CM

## 2024-05-29 NOTE — Telephone Encounter (Unsigned)
 Copied from CRM [id number]. Topic: Clinical - Medication Refill >> May 29, 2024 10:09 AM [name] wrote: Medication: insulin  glargine (LANTUS  SOLOSTAR) 100 UNIT/ML Solostar Pen  Has the patient contacted their pharmacy? Yes (Agent: If no, request that the patient contact the pharmacy for the refill. If patient does not wish to contact the pharmacy document the reason why and proceed with request.) (Agent: If yes, when and what did the pharmacy advise?)  This is the patient's preferred pharmacy:   [facility] [phone] [place] - [address] [place] [address] Phone: [phone] Fax: [phone] Hours: Not open 24 hours   Is this the correct pharmacy for this prescription? Yes If no, delete pharmacy and type the correct one.   Has the prescription been filled recently? No  Is the patient out of the medication? Yes  Has the patient been seen for an appointment in the last year OR does the patient have an upcoming appointment? Yes  Can we respond through MyChart? Yes  Agent: Please be advised that Rx refills may take up to 3 business days. We ask that you follow-up with your pharmacy.

## 2024-05-30 MED ORDER — LANTUS SOLOSTAR 100 UNIT/ML ~~LOC~~ SOPN: 6.0000 [IU] | PEN_INJECTOR | Freq: Every day | SUBCUTANEOUS | 0 refills | Status: DC | PRN

## 2024-06-03 ENCOUNTER — Other Ambulatory Visit: Payer: Self-pay

## 2024-06-24 ENCOUNTER — Other Ambulatory Visit: Payer: Self-pay

## 2024-06-24 ENCOUNTER — Other Ambulatory Visit (HOSPITAL_COMMUNITY): Payer: Self-pay

## 2024-06-24 DIAGNOSIS — E1165 Type 2 diabetes mellitus with hyperglycemia: Secondary | ICD-10-CM

## 2024-06-24 MED ORDER — INSULIN PEN NEEDLE 32G X 4 MM MISC
3 refills | Status: DC
  Filled 2024-06-24: qty 100, 30d supply, fill #0

## 2024-06-24 MED ORDER — FREESTYLE LIBRE 3 PLUS SENSOR MISC
6 refills | Status: AC
  Filled 2024-06-24: qty 2, 30d supply, fill #0

## 2024-06-25 ENCOUNTER — Other Ambulatory Visit (HOSPITAL_COMMUNITY): Payer: Self-pay

## 2024-07-05 ENCOUNTER — Other Ambulatory Visit (HOSPITAL_COMMUNITY): Payer: Self-pay

## 2024-07-25 ENCOUNTER — Other Ambulatory Visit (HOSPITAL_COMMUNITY): Payer: Self-pay

## 2024-07-25 ENCOUNTER — Encounter: Payer: Self-pay | Admitting: Family

## 2024-07-25 ENCOUNTER — Ambulatory Visit: Admitting: Family

## 2024-07-25 VITALS — BP 116/74 | HR 100 | Temp 97.8°F | Ht 69.0 in | Wt 146.2 lb

## 2024-07-25 DIAGNOSIS — E1165 Type 2 diabetes mellitus with hyperglycemia: Secondary | ICD-10-CM | POA: Diagnosis not present

## 2024-07-25 DIAGNOSIS — Z7984 Long term (current) use of oral hypoglycemic drugs: Secondary | ICD-10-CM | POA: Diagnosis not present

## 2024-07-25 LAB — POCT GLYCOSYLATED HEMOGLOBIN (HGB A1C): Hemoglobin A1C: 8.1 % — AB (ref 4.0–5.6)

## 2024-07-25 MED ORDER — INSULIN PEN NEEDLE 32G X 4 MM MISC
3 refills | Status: AC
  Filled 2024-07-25: qty 100, 30d supply, fill #0

## 2024-07-25 MED ORDER — LANTUS SOLOSTAR 100 UNIT/ML ~~LOC~~ SOPN
14.0000 [IU] | PEN_INJECTOR | Freq: Every day | SUBCUTANEOUS | 3 refills | Status: AC
  Filled 2024-07-25: qty 3, 21d supply, fill #0

## 2024-07-25 MED ORDER — METFORMIN HCL ER 500 MG PO TB24
1000.0000 mg | ORAL_TABLET | Freq: Two times a day (BID) | ORAL | 3 refills | Status: AC
  Filled 2024-07-25: qty 120, 30d supply, fill #0

## 2024-07-25 NOTE — Patient Instructions (Signed)
 Appears you are due for eye exam for diabetes; I dont see exam for 2025  Please call to schedule this.   Increase lantus  14 units   Goal of fasting blood sugar is between 70-120. If in this range, we are reaching our target a1c ( goal 6.5%)   Please check fasting blood sugar in the morning time once or twice a week.  You may also check if you feel like you are having a low episode or particularly high episode of blood sugar.  If blood sugars increase, I may advise you to check blood sugar after your largest meal.  You specifically do this TWO hours after largest meal with the goal of being less than 180.  If blood sugar is checked sooner than 2 hours after largest meal, and it will be  expected to be elevated. You must wait 2 hours.   If your blood sugar is less than 180 hours after your largest meal, again we are reaching our target a1c goal   Call [facility] clinic if: BG < 70 or > 300.   If you have any symptoms of low blood sugar ( sweating, shakiness, lightheaded, dizzy) that you notify me. If you have a low, please drink a glass of orange juice and recheck blood sugar every 5 minutes until you dont feel symptomatic AND blood sugar is above 80.

## 2024-07-25 NOTE — Assessment & Plan Note (Addendum)
 GMI 8.2% Average glucose 204 No lows in 90 days. Increase Lantus  to 14 units daily, continue metformin  2000 mg daily, glipizide  5mg  with breakfast only.  Discussed again risk of hypoglycemia on glipizide .

## 2024-07-25 NOTE — Progress Notes (Signed)
 "  Assessment & Plan:  Type 2 diabetes mellitus with hyperglycemia, without long-term current use of insulin  (HCC) Assessment & Plan: GMI 8.2% Average glucose 204 No lows in 90 days. Increase Lantus  to 14 units daily, continue metformin  2000 mg daily, glipizide  5mg  with breakfast only.  Discussed again risk of hypoglycemia on glipizide .   Orders: -     Lantus  SoloStar; Inject 14 Units into the skin daily.  Dispense: 15 mL; Refill: 3 -     Insulin  Pen Needle; Use once daily as directed.  Dispense: 100 each; Refill: 3 -     metFORMIN  HCl ER; Take 2 tablets (1,000 mg total) by mouth in the morning and at bedtime.  Dispense: 360 tablet; Refill: 3 -     POCT glycosylated hemoglobin (Hb A1C)     Return precautions given.   Risks, benefits, and alternatives of the medications and treatment plan prescribed today were discussed, and patient expressed understanding.   Education regarding symptom management and diagnosis given to patient on AVS either electronically or printed.  Return in about 3 months (around 10/23/2024).  [name], FNP  Subjective:    Patient ID: Jim [name], male    DOB: 1966/07/09, 59 y.o.   MRN: [id number]  CC: Jim [name] is a 59 y.o. male who presents today for follow up.   HPI: Here for DM f/u Feels well today No new complaints  Denies hypoglycemic episodes    Compliant with Lantus  11 units daily, metformin  2000 mg daily, glipizide  5mg  with breakfast only and periodically 5mg  with dinner without hypoglycemic episodes   Allergies: Jardiance  [empagliflozin ] and Ciprofloxacin  Medications Ordered Prior to Encounter[1]  Review of Systems  Constitutional:  Negative for chills and fever.  Respiratory:  Negative for cough.   Cardiovascular:  Negative for chest pain and palpitations.  Gastrointestinal:  Negative for nausea and vomiting.      Objective:    BP 116/74   Pulse 100   Temp 97.8 F (36.6 C)   Ht 5' 9 (1.753 m)   Wt 146 lb 3.2  oz (66.3 kg)   SpO2 98%   BMI 21.59 kg/m  BP Readings from Last 3 Encounters:  07/25/24 116/74  04/11/24 116/78  12/28/23 110/80   Wt Readings from Last 3 Encounters:  07/25/24 146 lb 3.2 oz (66.3 kg)  04/11/24 145 lb 3.2 oz (65.9 kg)  12/28/23 145 lb (65.8 kg)    Physical Exam Vitals reviewed.  Constitutional:      Appearance: He is well-developed.  Cardiovascular:     Rate and Rhythm: Regular rhythm.     Heart sounds: Normal heart sounds.  Pulmonary:     Effort: Pulmonary effort is normal. No respiratory distress.     Breath sounds: Normal breath sounds. No wheezing, rhonchi or rales.  Skin:    General: Skin is warm and dry.  Neurological:     Mental Status: He is alert.  Psychiatric:        Speech: Speech normal.        Behavior: Behavior normal.           [1]  Current Outpatient Medications on File Prior to Visit  Medication Sig Dispense Refill   acetaminophen (TYLENOL) 500 MG tablet Take 500 mg by mouth every 6 (six) hours as needed.     Continuous Glucose Sensor (FREESTYLE LIBRE 3 PLUS SENSOR) MISC Change sensor every 15 days. 2 each 6   desoximetasone  (TOPICORT ) 0.05 % cream Apply topically 2 (two)  times daily. Using sparingly less than one week. For poison oak 60 g 0   glipiZIDE  (GLUCOTROL ) 5 MG tablet Take 1 tablet (5 mg total) by mouth 2 (two) times daily before a meal. 180 tablet 3   Glucose Blood (BLOOD GLUCOSE TEST STRIPS) STRP True Result test strips, to test blood glucose 3 times daily, use as directed 100 each 5   tiZANidine  (ZANAFLEX ) 2 MG tablet Take 1 tablet (2 mg total) by mouth at bedtime as needed for muscle spasms. 30 tablet 0   triamcinolone  ointment (KENALOG ) 0.5 % Apply topically 2 (two) times daily. Use sparingly for less than 1 week 15 g 2   TRUEPLUS LANCETS 30G MISC USE AS DIRECTED 100 each 11   No current facility-administered medications on file prior to visit.   "

## 2024-11-07 ENCOUNTER — Ambulatory Visit: Admitting: Family
